# Patient Record
Sex: Female | Born: 1935 | Race: White | Hispanic: No | Marital: Married | Smoking: Never smoker
Health system: Southern US, Community
[De-identification: ages and names within clinical notes are randomized; demographics above are authoritative.]

## PROBLEM LIST (undated history)

## (undated) DIAGNOSIS — N052 Unspecified nephritic syndrome with diffuse membranous glomerulonephritis: Secondary | ICD-10-CM

## (undated) DIAGNOSIS — I10 Essential (primary) hypertension: Secondary | ICD-10-CM

## (undated) DIAGNOSIS — C541 Malignant neoplasm of endometrium: Secondary | ICD-10-CM

## (undated) DIAGNOSIS — M1990 Unspecified osteoarthritis, unspecified site: Secondary | ICD-10-CM

## (undated) DIAGNOSIS — G111 Early-onset cerebellar ataxia: Secondary | ICD-10-CM

## (undated) DIAGNOSIS — K635 Polyp of colon: Secondary | ICD-10-CM

## (undated) DIAGNOSIS — I639 Cerebral infarction, unspecified: Secondary | ICD-10-CM

## (undated) DIAGNOSIS — K449 Diaphragmatic hernia without obstruction or gangrene: Secondary | ICD-10-CM

## (undated) DIAGNOSIS — R269 Unspecified abnormalities of gait and mobility: Secondary | ICD-10-CM

## (undated) DIAGNOSIS — M8580 Other specified disorders of bone density and structure, unspecified site: Secondary | ICD-10-CM

## (undated) DIAGNOSIS — R32 Unspecified urinary incontinence: Secondary | ICD-10-CM

## (undated) DIAGNOSIS — C50919 Malignant neoplasm of unspecified site of unspecified female breast: Secondary | ICD-10-CM

## (undated) DIAGNOSIS — B159 Hepatitis A without hepatic coma: Secondary | ICD-10-CM

## (undated) DIAGNOSIS — E785 Hyperlipidemia, unspecified: Secondary | ICD-10-CM

## (undated) DIAGNOSIS — C4490 Unspecified malignant neoplasm of skin, unspecified: Secondary | ICD-10-CM

## (undated) DIAGNOSIS — G459 Transient cerebral ischemic attack, unspecified: Secondary | ICD-10-CM

## (undated) DIAGNOSIS — M5136 Other intervertebral disc degeneration, lumbar region: Secondary | ICD-10-CM

## (undated) DIAGNOSIS — Z973 Presence of spectacles and contact lenses: Secondary | ICD-10-CM

## (undated) DIAGNOSIS — Z8673 Personal history of transient ischemic attack (TIA), and cerebral infarction without residual deficits: Principal | ICD-10-CM

## (undated) DIAGNOSIS — E669 Obesity, unspecified: Secondary | ICD-10-CM

## (undated) HISTORY — DX: Unspecified urinary incontinence: R32

## (undated) HISTORY — DX: Obesity, unspecified: E66.9

## (undated) HISTORY — DX: Transient cerebral ischemic attack, unspecified: G45.9

## (undated) HISTORY — DX: Other intervertebral disc degeneration, lumbar region: M51.36

## (undated) HISTORY — DX: Polyp of colon: K63.5

## (undated) HISTORY — DX: Unspecified osteoarthritis, unspecified site: M19.90

## (undated) HISTORY — DX: Cerebral infarction, unspecified: I63.9

## (undated) HISTORY — PX: ABDOMINAL HYSTERECTOMY: SHX81

## (undated) HISTORY — DX: Unspecified malignant neoplasm of skin, unspecified: C44.90

## (undated) HISTORY — DX: Presence of spectacles and contact lenses: Z97.3

## (undated) HISTORY — PX: BREAST LUMPECTOMY: SHX2

## (undated) HISTORY — PX: TOTAL KNEE ARTHROPLASTY: SHX125

## (undated) HISTORY — DX: Essential (primary) hypertension: I10

## (undated) HISTORY — DX: Diaphragmatic hernia without obstruction or gangrene: K44.9

## (undated) HISTORY — DX: Personal history of transient ischemic attack (TIA), and cerebral infarction without residual deficits: Z86.73

## (undated) HISTORY — DX: Other specified disorders of bone density and structure, unspecified site: M85.80

## (undated) HISTORY — DX: Unspecified nephritic syndrome with diffuse membranous glomerulonephritis: N05.2

## (undated) HISTORY — PX: COLONOSCOPY: SHX174

## (undated) HISTORY — PX: CATARACT EXTRACTION: SUR2

## (undated) HISTORY — DX: Malignant neoplasm of unspecified site of unspecified female breast: C50.919

## (undated) HISTORY — DX: Unspecified abnormalities of gait and mobility: R26.9

## (undated) HISTORY — DX: Hyperlipidemia, unspecified: E78.5

## (undated) HISTORY — PX: OTHER SURGICAL HISTORY: SHX169

## (undated) HISTORY — DX: Early-onset cerebellar ataxia: G11.1

## (undated) HISTORY — DX: Malignant neoplasm of endometrium: C54.1

## (undated) HISTORY — PX: REPLACEMENT TOTAL KNEE: SUR1224

## (undated) HISTORY — PX: APPENDECTOMY: SHX54

## (undated) HISTORY — DX: Hepatitis a without hepatic coma: B15.9

## (undated) HISTORY — PX: SKIN CANCER EXCISION: SHX779

---

## 1997-12-14 ENCOUNTER — Other Ambulatory Visit: Admission: RE | Admit: 1997-12-14 | Discharge: 1997-12-14 | Payer: Self-pay

## 1997-12-20 ENCOUNTER — Other Ambulatory Visit: Admission: RE | Admit: 1997-12-20 | Discharge: 1997-12-20 | Payer: Self-pay | Admitting: Nephrology

## 1998-01-17 ENCOUNTER — Other Ambulatory Visit: Admission: RE | Admit: 1998-01-17 | Discharge: 1998-01-17 | Payer: Self-pay | Admitting: *Deleted

## 1998-04-19 ENCOUNTER — Ambulatory Visit (HOSPITAL_COMMUNITY): Admission: RE | Admit: 1998-04-19 | Discharge: 1998-04-19 | Payer: Self-pay | Admitting: Cardiology

## 1998-10-18 ENCOUNTER — Other Ambulatory Visit: Admission: RE | Admit: 1998-10-18 | Discharge: 1998-10-18 | Payer: Self-pay | Admitting: Obstetrics & Gynecology

## 1999-06-03 ENCOUNTER — Ambulatory Visit (HOSPITAL_COMMUNITY): Admission: RE | Admit: 1999-06-03 | Discharge: 1999-06-03 | Payer: Self-pay | Admitting: Gastroenterology

## 1999-06-03 ENCOUNTER — Encounter (INDEPENDENT_AMBULATORY_CARE_PROVIDER_SITE_OTHER): Payer: Self-pay

## 2000-01-08 ENCOUNTER — Other Ambulatory Visit: Admission: RE | Admit: 2000-01-08 | Discharge: 2000-01-08 | Payer: Self-pay | Admitting: Obstetrics & Gynecology

## 2000-06-24 ENCOUNTER — Encounter: Payer: Self-pay | Admitting: *Deleted

## 2000-06-24 ENCOUNTER — Encounter: Admission: RE | Admit: 2000-06-24 | Discharge: 2000-06-24 | Payer: Self-pay | Admitting: *Deleted

## 2000-06-30 ENCOUNTER — Encounter: Admission: RE | Admit: 2000-06-30 | Discharge: 2000-06-30 | Payer: Self-pay | Admitting: *Deleted

## 2000-06-30 ENCOUNTER — Encounter: Payer: Self-pay | Admitting: *Deleted

## 2001-02-15 ENCOUNTER — Other Ambulatory Visit: Admission: RE | Admit: 2001-02-15 | Discharge: 2001-02-15 | Payer: Self-pay | Admitting: Obstetrics & Gynecology

## 2001-07-19 ENCOUNTER — Encounter: Payer: Self-pay | Admitting: *Deleted

## 2001-07-19 ENCOUNTER — Encounter: Admission: RE | Admit: 2001-07-19 | Discharge: 2001-07-19 | Payer: Self-pay | Admitting: *Deleted

## 2002-03-28 ENCOUNTER — Other Ambulatory Visit: Admission: RE | Admit: 2002-03-28 | Discharge: 2002-03-28 | Payer: Self-pay | Admitting: Obstetrics & Gynecology

## 2002-07-20 ENCOUNTER — Encounter: Admission: RE | Admit: 2002-07-20 | Discharge: 2002-07-20 | Payer: Self-pay | Admitting: Obstetrics & Gynecology

## 2002-07-20 ENCOUNTER — Encounter: Payer: Self-pay | Admitting: Obstetrics & Gynecology

## 2002-12-08 ENCOUNTER — Encounter: Payer: Self-pay | Admitting: Family Medicine

## 2002-12-08 ENCOUNTER — Encounter: Admission: RE | Admit: 2002-12-08 | Discharge: 2002-12-08 | Payer: Self-pay | Admitting: Family Medicine

## 2003-06-07 ENCOUNTER — Other Ambulatory Visit: Admission: RE | Admit: 2003-06-07 | Discharge: 2003-06-07 | Payer: Self-pay | Admitting: Obstetrics & Gynecology

## 2003-08-23 ENCOUNTER — Encounter: Admission: RE | Admit: 2003-08-23 | Discharge: 2003-08-23 | Payer: Self-pay | Admitting: Obstetrics & Gynecology

## 2003-10-30 ENCOUNTER — Other Ambulatory Visit: Admission: RE | Admit: 2003-10-30 | Discharge: 2003-10-30 | Payer: Self-pay | Admitting: Obstetrics & Gynecology

## 2004-08-23 ENCOUNTER — Encounter: Admission: RE | Admit: 2004-08-23 | Discharge: 2004-08-23 | Payer: Self-pay | Admitting: Family Medicine

## 2004-09-25 ENCOUNTER — Ambulatory Visit (HOSPITAL_COMMUNITY): Admission: RE | Admit: 2004-09-25 | Discharge: 2004-09-25 | Payer: Self-pay | Admitting: Gastroenterology

## 2004-09-25 ENCOUNTER — Encounter (INDEPENDENT_AMBULATORY_CARE_PROVIDER_SITE_OTHER): Payer: Self-pay | Admitting: Specialist

## 2004-09-30 ENCOUNTER — Encounter: Admission: RE | Admit: 2004-09-30 | Discharge: 2004-09-30 | Payer: Self-pay | Admitting: Family Medicine

## 2004-11-26 ENCOUNTER — Other Ambulatory Visit: Admission: RE | Admit: 2004-11-26 | Discharge: 2004-11-26 | Payer: Self-pay | Admitting: Obstetrics & Gynecology

## 2004-12-13 ENCOUNTER — Encounter: Admission: RE | Admit: 2004-12-13 | Discharge: 2004-12-13 | Payer: Self-pay | Admitting: Family Medicine

## 2004-12-14 DIAGNOSIS — M51369 Other intervertebral disc degeneration, lumbar region without mention of lumbar back pain or lower extremity pain: Secondary | ICD-10-CM

## 2004-12-14 DIAGNOSIS — M5136 Other intervertebral disc degeneration, lumbar region: Secondary | ICD-10-CM

## 2004-12-14 HISTORY — DX: Other intervertebral disc degeneration, lumbar region: M51.36

## 2004-12-14 HISTORY — DX: Other intervertebral disc degeneration, lumbar region without mention of lumbar back pain or lower extremity pain: M51.369

## 2004-12-17 ENCOUNTER — Encounter: Admission: RE | Admit: 2004-12-17 | Discharge: 2004-12-17 | Payer: Self-pay | Admitting: Family Medicine

## 2004-12-20 ENCOUNTER — Encounter: Admission: RE | Admit: 2004-12-20 | Discharge: 2004-12-20 | Payer: Self-pay | Admitting: Family Medicine

## 2004-12-24 ENCOUNTER — Encounter: Admission: RE | Admit: 2004-12-24 | Discharge: 2004-12-24 | Payer: Self-pay | Admitting: Gastroenterology

## 2005-07-28 ENCOUNTER — Encounter: Admission: RE | Admit: 2005-07-28 | Discharge: 2005-07-28 | Payer: Self-pay | Admitting: Family Medicine

## 2005-10-20 ENCOUNTER — Encounter: Admission: RE | Admit: 2005-10-20 | Discharge: 2005-10-20 | Payer: Self-pay | Admitting: Family Medicine

## 2005-12-29 ENCOUNTER — Other Ambulatory Visit: Admission: RE | Admit: 2005-12-29 | Discharge: 2005-12-29 | Payer: Self-pay | Admitting: Obstetrics & Gynecology

## 2006-07-08 ENCOUNTER — Ambulatory Visit: Payer: Self-pay | Admitting: Family Medicine

## 2007-03-10 ENCOUNTER — Encounter: Admission: RE | Admit: 2007-03-10 | Discharge: 2007-03-10 | Payer: Self-pay | Admitting: Obstetrics & Gynecology

## 2007-03-18 ENCOUNTER — Encounter: Admission: RE | Admit: 2007-03-18 | Discharge: 2007-03-18 | Payer: Self-pay | Admitting: Obstetrics & Gynecology

## 2007-03-18 ENCOUNTER — Encounter (INDEPENDENT_AMBULATORY_CARE_PROVIDER_SITE_OTHER): Payer: Self-pay | Admitting: Diagnostic Radiology

## 2007-04-01 ENCOUNTER — Encounter: Admission: RE | Admit: 2007-04-01 | Discharge: 2007-04-01 | Payer: Self-pay | Admitting: Obstetrics & Gynecology

## 2007-04-05 ENCOUNTER — Ambulatory Visit: Payer: Self-pay | Admitting: Family Medicine

## 2007-04-28 ENCOUNTER — Encounter: Admission: RE | Admit: 2007-04-28 | Discharge: 2007-04-28 | Payer: Self-pay | Admitting: Surgery

## 2007-04-28 ENCOUNTER — Encounter (INDEPENDENT_AMBULATORY_CARE_PROVIDER_SITE_OTHER): Payer: Self-pay | Admitting: Surgery

## 2007-04-28 ENCOUNTER — Ambulatory Visit (HOSPITAL_COMMUNITY): Admission: RE | Admit: 2007-04-28 | Discharge: 2007-04-28 | Payer: Self-pay | Admitting: Surgery

## 2007-05-04 ENCOUNTER — Ambulatory Visit: Payer: Self-pay | Admitting: Oncology

## 2007-05-19 LAB — CBC WITH DIFFERENTIAL/PLATELET
BASO%: 0.8 % (ref 0.0–2.0)
EOS%: 3.7 % (ref 0.0–7.0)
HCT: 39.7 % (ref 34.8–46.6)
LYMPH%: 37.8 % (ref 14.0–48.0)
MCH: 33.1 pg (ref 26.0–34.0)
MCHC: 35.1 g/dL (ref 32.0–36.0)
MONO%: 8.5 % (ref 0.0–13.0)
NEUT%: 49.2 % (ref 39.6–76.8)
lymph#: 2.2 10*3/uL (ref 0.9–3.3)

## 2007-05-24 ENCOUNTER — Encounter: Admission: RE | Admit: 2007-05-24 | Discharge: 2007-05-24 | Payer: Self-pay | Admitting: Oncology

## 2007-05-24 LAB — COMPREHENSIVE METABOLIC PANEL
ALT: 11 U/L (ref 0–35)
AST: 12 U/L (ref 0–37)
Alkaline Phosphatase: 62 U/L (ref 39–117)
Chloride: 105 mEq/L (ref 96–112)
Creatinine, Ser: 0.76 mg/dL (ref 0.40–1.20)
Total Bilirubin: 0.6 mg/dL (ref 0.3–1.2)

## 2007-05-24 LAB — LACTATE DEHYDROGENASE: LDH: 139 U/L (ref 94–250)

## 2007-05-24 LAB — VITAMIN D PNL(25-HYDRXY+1,25-DIHY)-BLD: Vit D, 1,25-Dihydroxy: 39 pg/mL (ref 6–62)

## 2007-05-24 LAB — CANCER ANTIGEN 27.29: CA 27.29: 16 U/mL (ref 0–39)

## 2007-07-22 ENCOUNTER — Ambulatory Visit: Payer: Self-pay | Admitting: Oncology

## 2007-10-21 ENCOUNTER — Ambulatory Visit: Payer: Self-pay | Admitting: Oncology

## 2007-10-25 LAB — CBC WITH DIFFERENTIAL/PLATELET
BASO%: 1.3 % (ref 0.0–2.0)
Basophils Absolute: 0.1 10*3/uL (ref 0.0–0.1)
HCT: 39.1 % (ref 34.8–46.6)
HGB: 13.6 g/dL (ref 11.6–15.9)
LYMPH%: 37.4 % (ref 14.0–48.0)
MCHC: 34.9 g/dL (ref 32.0–36.0)
MONO#: 0.3 10*3/uL (ref 0.1–0.9)
NEUT%: 50.4 % (ref 39.6–76.8)
Platelets: 187 10*3/uL (ref 145–400)
WBC: 4.7 10*3/uL (ref 3.9–10.0)

## 2007-10-25 LAB — COMPREHENSIVE METABOLIC PANEL
ALT: 12 U/L (ref 0–35)
BUN: 24 mg/dL — ABNORMAL HIGH (ref 6–23)
CO2: 28 mEq/L (ref 19–32)
Calcium: 9 mg/dL (ref 8.4–10.5)
Creatinine, Ser: 0.84 mg/dL (ref 0.40–1.20)
Glucose, Bld: 199 mg/dL — ABNORMAL HIGH (ref 70–99)
Total Bilirubin: 1 mg/dL (ref 0.3–1.2)

## 2007-10-26 ENCOUNTER — Ambulatory Visit: Payer: Self-pay | Admitting: Family Medicine

## 2007-10-28 LAB — VITAMIN D 1,25 DIHYDROXY: Vit D, 1,25-Dihydroxy: 36 pg/mL (ref 6–62)

## 2008-01-07 LAB — HM COLONOSCOPY

## 2008-01-24 ENCOUNTER — Ambulatory Visit: Payer: Self-pay | Admitting: Family Medicine

## 2008-04-24 ENCOUNTER — Encounter: Admission: RE | Admit: 2008-04-24 | Discharge: 2008-04-24 | Payer: Self-pay | Admitting: Oncology

## 2008-04-25 ENCOUNTER — Ambulatory Visit: Payer: Self-pay | Admitting: Family Medicine

## 2008-05-15 ENCOUNTER — Inpatient Hospital Stay (HOSPITAL_COMMUNITY): Admission: RE | Admit: 2008-05-15 | Discharge: 2008-05-18 | Payer: Self-pay | Admitting: Orthopedic Surgery

## 2008-06-28 ENCOUNTER — Ambulatory Visit: Payer: Self-pay | Admitting: Family Medicine

## 2008-07-18 ENCOUNTER — Emergency Department (HOSPITAL_COMMUNITY): Admission: EM | Admit: 2008-07-18 | Discharge: 2008-07-18 | Payer: Self-pay | Admitting: Emergency Medicine

## 2008-08-02 ENCOUNTER — Ambulatory Visit: Payer: Self-pay | Admitting: Family Medicine

## 2008-10-20 ENCOUNTER — Ambulatory Visit: Payer: Self-pay | Admitting: Oncology

## 2008-10-24 LAB — COMPREHENSIVE METABOLIC PANEL
ALT: 23 U/L (ref 0–35)
Alkaline Phosphatase: 32 U/L — ABNORMAL LOW (ref 39–117)
Creatinine, Ser: 1.01 mg/dL (ref 0.40–1.20)
Sodium: 140 mEq/L (ref 135–145)
Total Bilirubin: 0.8 mg/dL (ref 0.3–1.2)
Total Protein: 5.9 g/dL — ABNORMAL LOW (ref 6.0–8.3)

## 2008-10-24 LAB — CBC WITH DIFFERENTIAL/PLATELET
BASO%: 0.3 % (ref 0.0–2.0)
LYMPH%: 29.6 % (ref 14.0–48.0)
MCH: 32.8 pg (ref 26.0–34.0)
MCHC: 33.9 g/dL (ref 32.0–36.0)
MCV: 96.8 fL (ref 81.0–101.0)
MONO%: 5.7 % (ref 0.0–13.0)
Platelets: 156 10*3/uL (ref 145–400)
RBC: 4.01 10*6/uL (ref 3.70–5.32)

## 2009-05-04 ENCOUNTER — Encounter: Admission: RE | Admit: 2009-05-04 | Discharge: 2009-05-04 | Payer: Self-pay | Admitting: Surgery

## 2009-05-04 ENCOUNTER — Ambulatory Visit: Payer: Self-pay | Admitting: Oncology

## 2009-05-09 ENCOUNTER — Ambulatory Visit: Payer: Self-pay | Admitting: Family Medicine

## 2009-05-13 IMAGING — US UNKNOWN US STUDY
1 series · 16 of 16 positions shown · non-contrast
Comparison: none

[REDACTED] LEFT
CC and MLO view(s) were taken of the left breast.

LEFT BREAST ULTRASOUND
Technologist: Elyesa Ver, Medical
DIGITAL LIMITED LEFT DIAGNOSTIC MAMMOGRAM AND LEFT BREAST ULTRASOUND:
CLINICAL DATA: 77-year-old returns after screening study on [DATE] for evaluation of the left 
breast.

[Series 1: unknown us study · 16 of 17 slices shown]
[im 1/17]
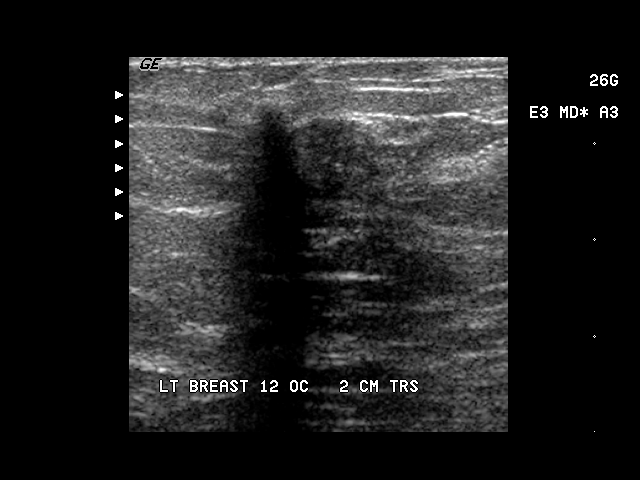
[im 2/17]
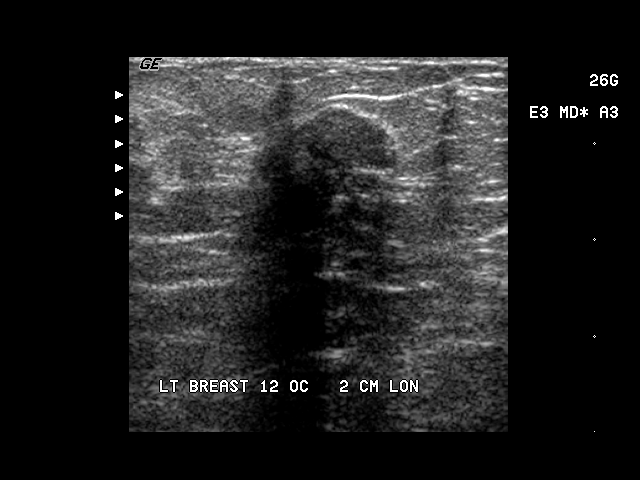
[im 3/17]
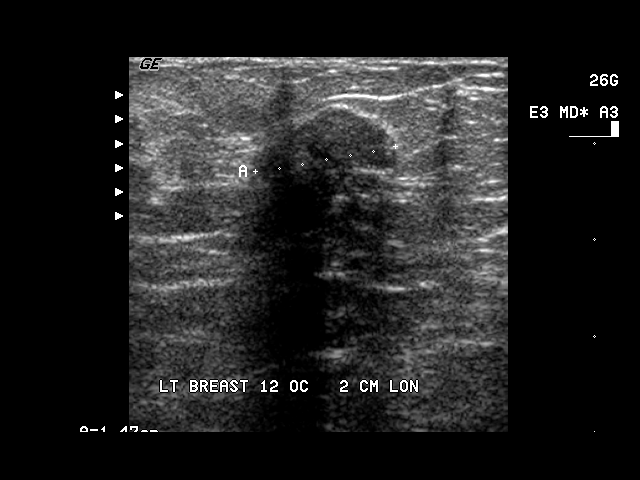
[im 4/17]
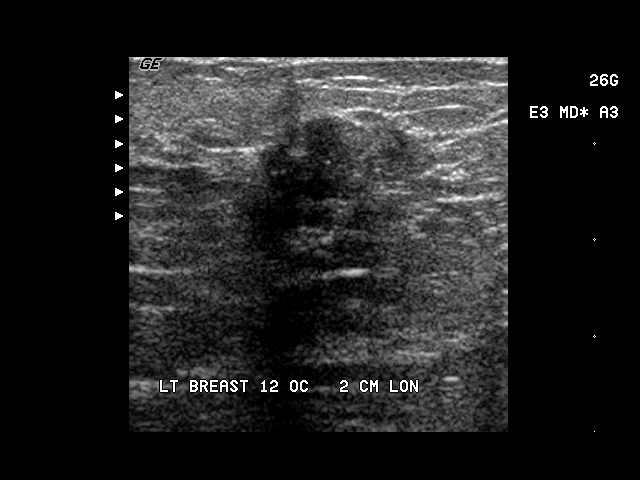
[im 5/17]
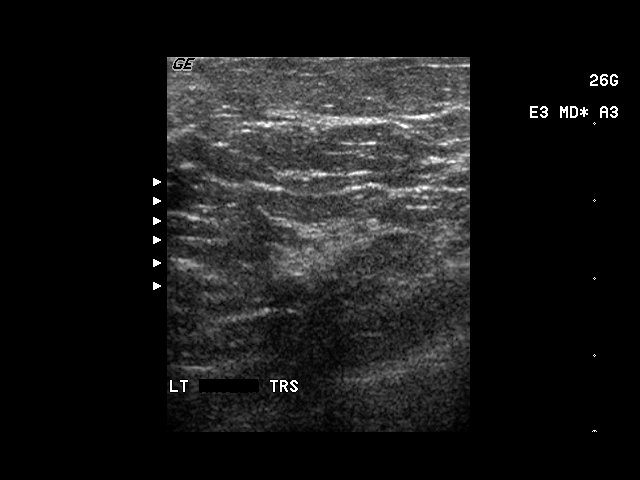
[im 6/17]
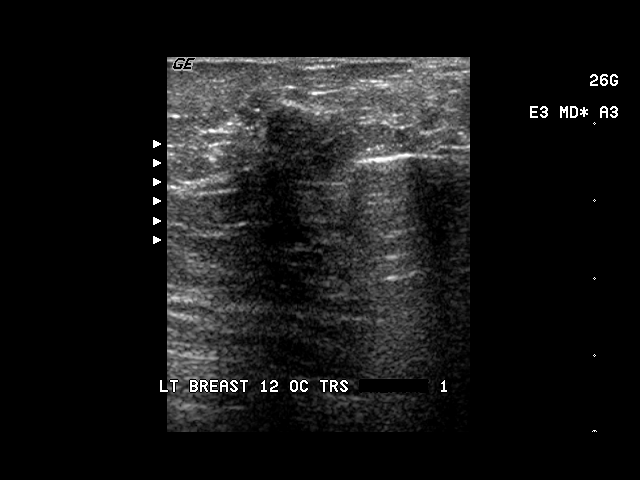
[im 7/17]
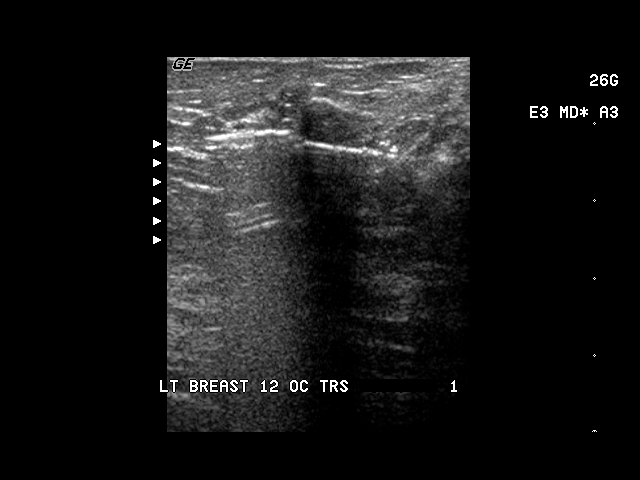
[im 8/17]
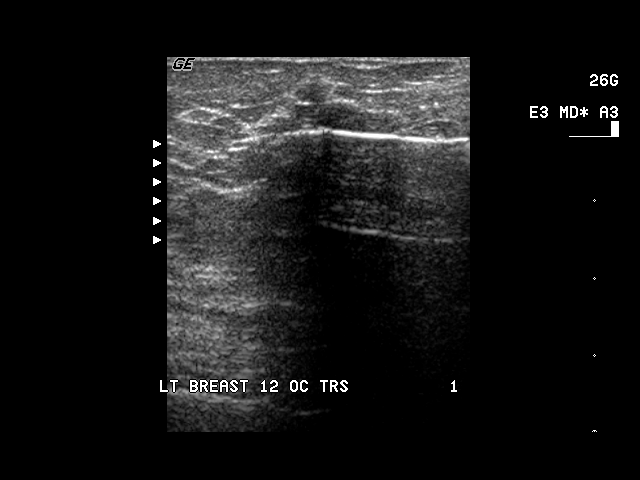
[im 9/17]
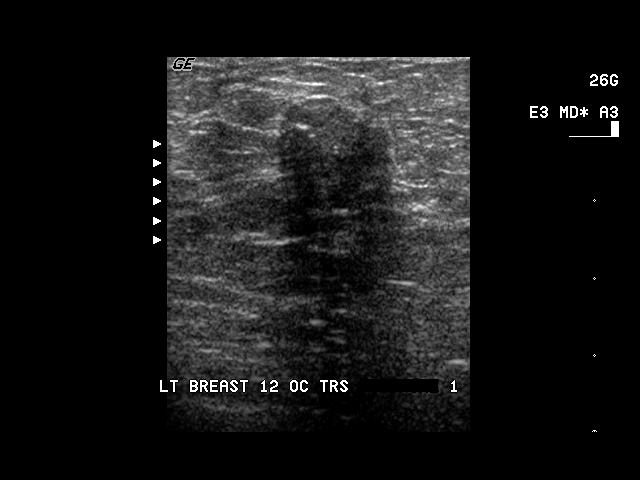
[im 10/17]
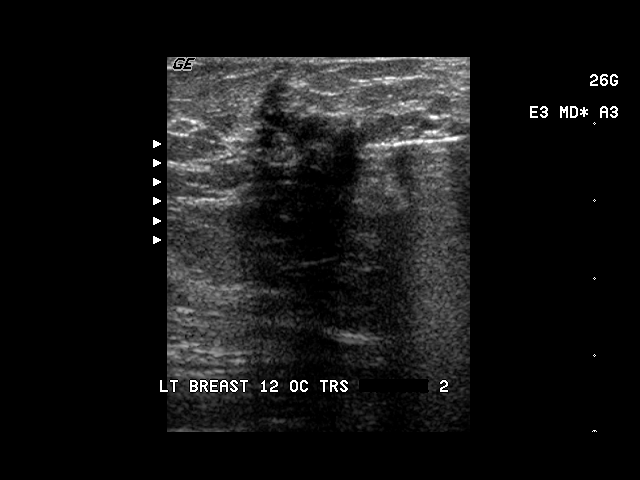
[im 11/17]
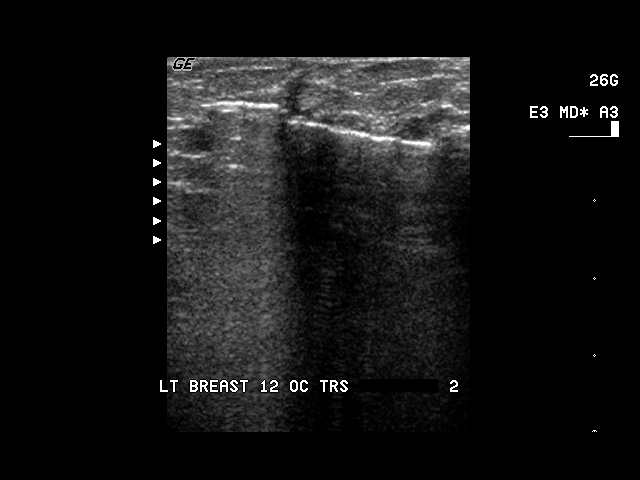
[im 12/17]
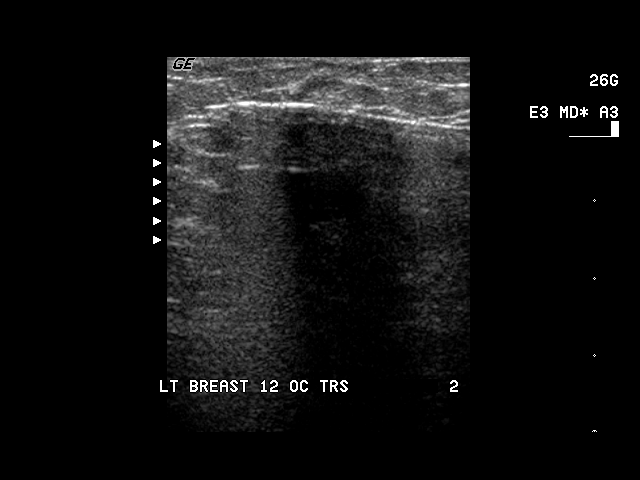
[im 13/17]
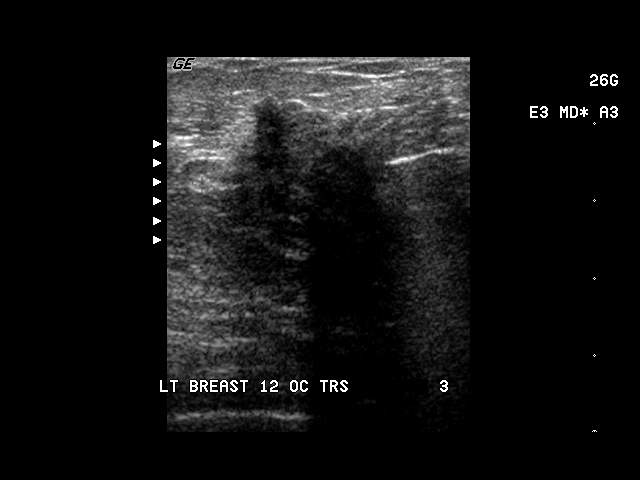
[im 14/17]
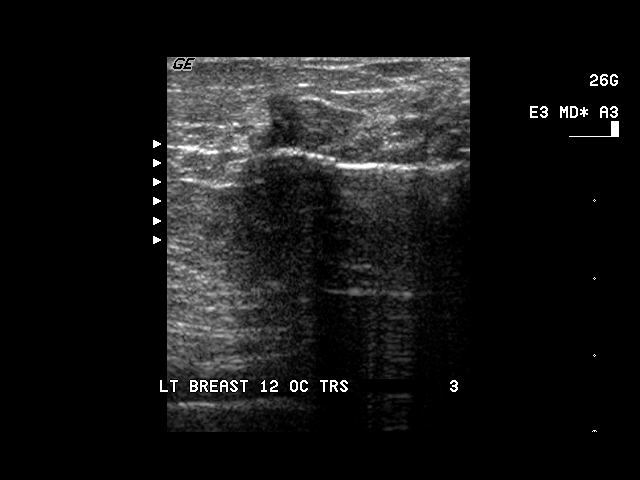
[im 15/17]
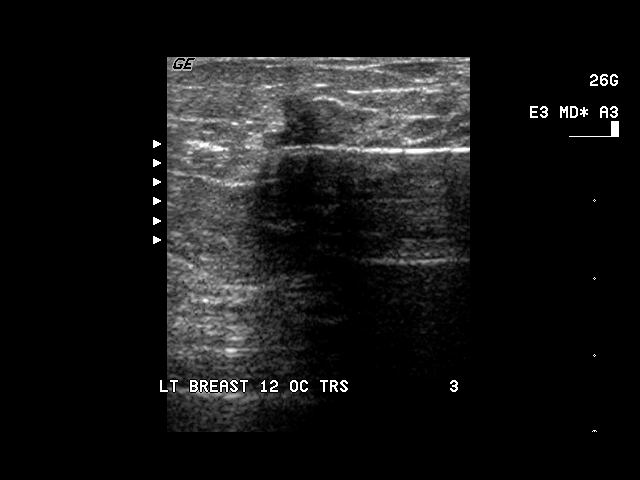
[im 17/17]
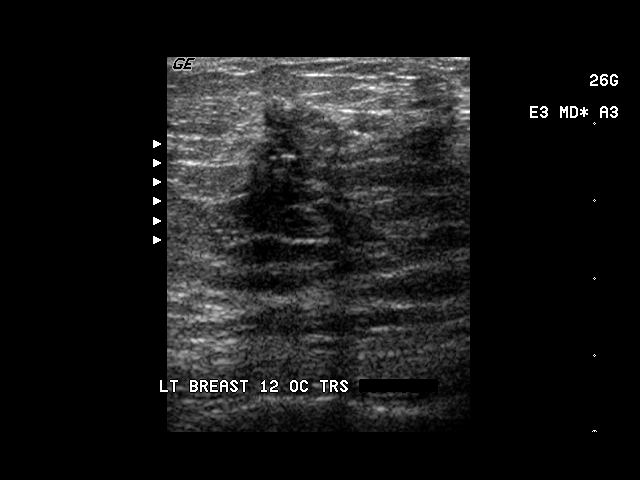

[16 of 16 positions shown; findings below may reference images not displayed]

Spot compression magnified views are performed of the 12 o'clock position of the left breast.  
These views demonstrate spiculated density associated with linear calcifications, suspicious for 
invasive ductal carcinoma and DCIS.

On physical exam, I do not palpate an abnormality in the 12 o'clock position of the left breast.  
Ultrasound is performed showing an ill-defined hypoechoic shadowing mass in the28 o'clock position 
2 cm from the left nipple.  This measures 1.5 cm in diameter.  Findings are suspicious for 
malignancy and biopsy is suggested.  Evaluation of the left axilla shows no enlarged lymph nodes.
IMPRESSION: Suspicious mass in the 12 o'clock position of the left breast for which biopsy is suggested.

ASSESSMENT: Suspicious - BI-RADS 4

Needle biopsy of the left breast.
,

## 2009-11-06 ENCOUNTER — Ambulatory Visit: Payer: Self-pay | Admitting: Oncology

## 2009-11-08 LAB — COMPREHENSIVE METABOLIC PANEL
ALT: 22 U/L (ref 0–35)
CO2: 29 mEq/L (ref 19–32)
Calcium: 9.1 mg/dL (ref 8.4–10.5)
Chloride: 104 mEq/L (ref 96–112)
Creatinine, Ser: 0.97 mg/dL (ref 0.40–1.20)
Total Protein: 6.2 g/dL (ref 6.0–8.3)

## 2009-11-08 LAB — CBC WITH DIFFERENTIAL/PLATELET
BASO%: 0.9 % (ref 0.0–2.0)
HCT: 39 % (ref 34.8–46.6)
HGB: 13.2 g/dL (ref 11.6–15.9)
MCHC: 34 g/dL (ref 31.5–36.0)
MONO#: 0.5 10*3/uL (ref 0.1–0.9)
NEUT#: 1.8 10*3/uL (ref 1.5–6.5)
NEUT%: 38.4 % (ref 38.4–76.8)
WBC: 4.7 10*3/uL (ref 3.9–10.3)
lymph#: 2.1 10*3/uL (ref 0.9–3.3)

## 2009-11-12 ENCOUNTER — Ambulatory Visit: Payer: Self-pay | Admitting: Family Medicine

## 2010-05-06 ENCOUNTER — Encounter: Admission: RE | Admit: 2010-05-06 | Discharge: 2010-05-06 | Payer: Self-pay | Admitting: Surgery

## 2010-05-06 LAB — HM MAMMOGRAPHY

## 2010-06-24 ENCOUNTER — Ambulatory Visit: Payer: Self-pay | Admitting: Family Medicine

## 2010-06-24 ENCOUNTER — Encounter: Admission: RE | Admit: 2010-06-24 | Discharge: 2010-06-24 | Payer: Self-pay | Admitting: Family Medicine

## 2010-06-26 ENCOUNTER — Ambulatory Visit: Payer: Self-pay | Admitting: Family Medicine

## 2010-09-26 ENCOUNTER — Encounter
Admission: RE | Admit: 2010-09-26 | Discharge: 2010-09-26 | Payer: Self-pay | Source: Home / Self Care | Attending: Neurology | Admitting: Neurology

## 2010-11-08 ENCOUNTER — Encounter: Payer: Medicare Other | Admitting: Oncology

## 2010-11-18 ENCOUNTER — Other Ambulatory Visit: Payer: Self-pay | Admitting: Oncology

## 2010-11-18 ENCOUNTER — Encounter (HOSPITAL_BASED_OUTPATIENT_CLINIC_OR_DEPARTMENT_OTHER): Payer: Medicare Other | Admitting: Oncology

## 2010-11-18 DIAGNOSIS — C50919 Malignant neoplasm of unspecified site of unspecified female breast: Secondary | ICD-10-CM

## 2010-11-18 DIAGNOSIS — Z853 Personal history of malignant neoplasm of breast: Secondary | ICD-10-CM

## 2010-11-18 DIAGNOSIS — Z780 Asymptomatic menopausal state: Secondary | ICD-10-CM

## 2010-11-18 LAB — COMPREHENSIVE METABOLIC PANEL
ALT: 28 U/L (ref 0–35)
CO2: 29 mEq/L (ref 19–32)
Calcium: 9.2 mg/dL (ref 8.4–10.5)
Chloride: 105 mEq/L (ref 96–112)
Potassium: 3.9 mEq/L (ref 3.5–5.3)
Sodium: 142 mEq/L (ref 135–145)
Total Protein: 6.1 g/dL (ref 6.0–8.3)

## 2010-11-18 LAB — CBC WITH DIFFERENTIAL/PLATELET
BASO%: 0.5 % (ref 0.0–2.0)
HCT: 42 % (ref 34.8–46.6)
MCHC: 33.4 g/dL (ref 31.5–36.0)
MONO#: 0.3 10*3/uL (ref 0.1–0.9)
RBC: 4.18 10*6/uL (ref 3.70–5.45)
RDW: 13.2 % (ref 11.2–14.5)
WBC: 4.9 10*3/uL (ref 3.9–10.3)
lymph#: 1.9 10*3/uL (ref 0.9–3.3)

## 2010-11-18 LAB — CANCER ANTIGEN 27.29: CA 27.29: 18 U/mL (ref 0–39)

## 2010-12-02 ENCOUNTER — Ambulatory Visit (INDEPENDENT_AMBULATORY_CARE_PROVIDER_SITE_OTHER): Payer: Medicare Other | Admitting: Family Medicine

## 2010-12-02 DIAGNOSIS — E7800 Pure hypercholesterolemia, unspecified: Secondary | ICD-10-CM

## 2010-12-02 DIAGNOSIS — I10 Essential (primary) hypertension: Secondary | ICD-10-CM

## 2011-01-01 ENCOUNTER — Ambulatory Visit (INDEPENDENT_AMBULATORY_CARE_PROVIDER_SITE_OTHER): Payer: Medicare Other | Admitting: Family Medicine

## 2011-01-01 DIAGNOSIS — J209 Acute bronchitis, unspecified: Secondary | ICD-10-CM

## 2011-01-01 DIAGNOSIS — I10 Essential (primary) hypertension: Secondary | ICD-10-CM

## 2011-01-01 DIAGNOSIS — E119 Type 2 diabetes mellitus without complications: Secondary | ICD-10-CM

## 2011-01-28 NOTE — Op Note (Signed)
Kristin Willis, Kristin Willis               ACCOUNT NO.:  [id number]   MEDICAL RECORD NO.:  [id number]          PATIENT TYPE:  AMB   LOCATION:  SDS                          FACILITY:  [facility]   PHYSICIAN:  Thomas A. Cornett, M.D.DATE OF BIRTH:  1928/11/10   DATE OF PROCEDURE:  04/28/2007  DATE OF DISCHARGE:                               OPERATIVE REPORT   PREOPERATIVE DIAGNOSIS:  Left breast cancer.   POSTOPERATIVE DIAGNOSIS:  Left breast cancer.   PROCEDURE:  1. Left breast needle localized lumpectomy.  2. Left axillary sentinel lymph node mapping with injection of      methylene blue dye.   SURGEON:  Maisie Fus A. Cornett, M.D.   ANESTHESIA:  General endotracheal anesthesia with 0.25% Sensorcaine  local.   ESTIMATED BLOOD LOSS:  40 mL.   SPECIMEN:  1. Left breast mass to pathology.  2. Left axillary sentinel node blue and hot, negative by touch prep      for metastatic disease.   DRAINS:  None.   INDICATIONS FOR PROCEDURE:  The patient is a 77 year old female who was  referred due to a left breast cancer.  We talked about treatment options  and she wished to pursue breast conservation and elected to undergo  lumpectomy with subsequent sentinel lymph node mapping.  She presents  today for that.  Informed consent was obtained.   DESCRIPTION OF PROCEDURE:  The patient was brought to the operating room  and placed supine.  She was localized by the radiologist preoperatively  and technetium sulfur colloid was injected in the left periareolar  position prior to surgery.  After sterile prep and drape and induction  of general anesthesia, the left breast was examined.  The NeoProbe was  used and a hot area in the left axilla was identified.  An incision was  made in the left axilla.  Dissection was carried down into the axilla.  A blue hot sentinel node was identified and sent to pathology.  The  remainder of the axilla was quiet with no increase in activity or blue  dye noted.  Touch prep  revealed this to be negative for metastatic  disease.   Next, the localizing wire was visualized.  In the left upper outer  quadrant is where the wire exited.  A curvilinear incision was made in  the direction of the nipple areolar complex.  This process was directly  below the nipple.  I stayed as superficial as I could to stay around  this while preserving the nipple complex.  This grossly looked close,  but I felt I had at least 2-3 mm below the nipple.  Cautery was used and  this was dissected away carefully.  I was able to excise the entire mass  with the localizing wire.  This was sent for radiograph and the wire,  clip and mass were all present.  Imprint cytology of the mass was done  and it looked like I had at least 2-3 mm from the superficial margin,  that was my closest [facility] according to the pathologist.  Both cavities  were then irrigated.  I  closed the lumpectomy cavity with 3-0 Vicryl.  4-  0 Monocryl was used to close the skin.  There was one area just between  the incision and the nipple laterally where there was a small burn from  the cautery.  I went ahead and excised this with a scalpel and closed it  with a single stitch of 4-0 Monocryl.  I then irrigated the axilla,  found it to be hemostatic, and closed it in layers with 3-0 Vicryl and 4-  0 Monocryl.  Dermabond was applied to both wounds.  There was minimal  cosmetic change of the breast.  At this point, once the Dermabond had  dried, the patient was awakened and taken to recovery in satisfactory  condition.  All final counts of sponge, needle and instruments were  found to be correct for this portion of the case.      Thomas A. Cornett, M.D.  Electronically Signed     TAC/MEDQ  D:  04/28/2007  T:  04/29/2007  Job:  [id number]   cc:   Sharlot Gowda, M.D.  Duke Salvia Eliott Nine, M.D.

## 2011-01-28 NOTE — Discharge Summary (Signed)
Kristin Willis, Kristin Willis               ACCOUNT NO.:  [id number]   MEDICAL RECORD NO.:  [id number]          PATIENT TYPE:  INP   LOCATION:  5006                         FACILITY:  [facility]   PHYSICIAN:  Robert A. Thurston Hole, M.D. DATE OF BIRTH:  Nov 28, 1928   DATE OF ADMISSION:  05/15/2008  DATE OF DISCHARGE:  05/18/2008                               DISCHARGE SUMMARY   ADMITTING DIAGNOSES:  1. End-stage degenerative joint disease left knee.  2. Diet-controlled diabetes.  3. Hypertension.  4. High cholesterol.  5. History of breast cancer.  6. History of membranous glomerulopathy of the kidneys.  7. History of endometrial cancer.  8. Gout.   DISCHARGE DIAGNOSES:  1. End-stage degenerative joint disease left knee.  2. Postoperative blood loss anemia.  3. Diet-controlled diabetes.  4. Hypertension.  5. High cholesterol.  6. Breast cancer.  7. Membranous glomerulopathy.  8. Endometrial cancer.  9. Gout.  10.Chronic obstructive pulmonary disease.   HISTORY OF PRESENT ILLNESS:  The patient is a 77 year old white female  with a history of end-stage DJD of her left knee.  She has failed  conservative care including anti-inflammatories and articular cortisone  injections and exercise.  Risk, benefits and possible complications of a  left total knee replacement have been discussed with the patient.  She  is without question.   PROCEDURES IN-HOUSE:  On May 15, 2008, the patient underwent a left  total knee replacement by Dr. Thurston Hole, femoral nerve block by Anesthesia  and a Autovac transfusion.  She tolerated all of these procedures well.  Nephrology was consulted in the recovery room due to her kidney disease  to help manage fluid balance and renal function but he held her  lisinopril and Cozaar for the next 1-2 days until she was  hemodynamically stable.  If we needed blood pressure control, we could  write clonidine.  Postop day #1, the patient's hemoglobin was 11.7.  Her  glucose was  high at 207.  Her BUN was high at 24.  Otherwise, she was  metabolically stable.  She tolerated her CPM 0 to 90 degrees.  Her PCA  was discontinued.  She was started on NUCYNTA 50 mg one to two q.4 h.  p.r.n. pain.  Her Foley was discontinued.  She was given a 500 mL bolus  of normal saline and her IV was saline locked.  Postop day #2, the  patient ambulated 20 feet with physical therapy.  Her hemoglobin was  10.2.  She was metabolically stable with a CBG of 184, a BUN of 12 and a  creatinine of 0.75.  She tolerated her CPM 0-90 degrees.  Surgical wound  was well-approximated.  She was given sorbitol for constipation  secondary to a recent surgery and narcotic use.  Postop day #2, the  patient was only able to walk 8 feet.  She was unable to support her  weight on her lower extremity with her leg giving away multiple times.  Postop day #3, the patient was significantly improved.  Her hemoglobin  was 9.4.  Her CBGs were 164 and 168.  She was metabolically  stable.  She  had no shortness of breath.  She ambulated 50 feet without dizziness.  She was discharged to home in stable condition after walking a 100 feet  with physical therapy today, significantly improved from yesterday.  She  is weightbearing as tolerated being discharged home on a diabetic diet.   DISCHARGE MEDICATIONS:  1. NUCYNTA 50 mg one to two q. 4-6 h. p.r.n. pain.  2. Robaxin 500 mg one q.6 h. p.r.n. muscle spasm.  3. Coumadin 7.5 mg daily by mouth until changed by [place].  4. Tamoxifen 20 mg daily.  5. Carvedilol 12.5 mg daily.  6. Lovaza 1g twice a day.  7. Klor-Con twice a day.   The rest of her medicines have been put on hold until her blood pressure  is greater than 130/80.  She will follow up with Dr. Thurston Hole on May 29, 2008.  She will get home health physical therapy, home health  occupational therapy and nursing.      Kirstin Shepperson, P.A.      Robert A. Thurston Hole, M.D.  Electronically  Signed    KS/MEDQ  D:  06/21/2008  T:  06/22/2008  Job:  [id number]

## 2011-01-28 NOTE — Op Note (Signed)
NAMEAVYANNA, SPADA NO.:  [id number]   MEDICAL RECORD NO.:  [id number]          PATIENT TYPE:  INP   LOCATION:  2899                         FACILITY:  [facility]   PHYSICIAN:  Elana Alm. Thurston Hole, M.D. DATE OF BIRTH:  1929/04/18   DATE OF PROCEDURE:  05/15/2008  DATE OF DISCHARGE:                               OPERATIVE REPORT   PREOPERATIVE DIAGNOSIS:  Left knee degenerative joint disease.   POSTOPERATIVE DIAGNOSIS:  Left knee degenerative joint disease.   PROCEDURE:  Left total knee replacement using DePuy Cemented Total Knee  System with #3 cemented femur, #4 cemented tibia with 15-mm polyethylene  RP tibial spacer and 32-mm polyethylene cemented patella.   SURGEON:  Elana Alm. Thurston Hole, MD   ASSISTANT:  Julien Girt, PA   ANESTHESIA:  General.   OPERATIVE TIME:  1 hour and 20 minutes.   COMPLICATIONS:  None.   DESCRIPTION OF PROCEDURE:  Ms. Headen was brought to the operating room  on May 15, 2008, after a femoral block was placed on her by  Anesthesia.  She was placed on the operative table in supine position.  She received vancomycin 1 g IV preoperatively for prophylaxis.  After  being placed under general anesthesia, she had a Foley catheter placed  under sterile conditions.  Her left knee was examined.  Range of motion  from -5 to 125 degrees, mild varus deformity.  Knee stable on  ligamentous exam with normal patellar tracking.  The left leg was  prepped using sterile DuraPrep and draped using sterile technique.  Originally through a 15 cm longitudinal incision based over the patella,  an initial exposure was made.  The underlying subcutaneous tissues were  incised along with skin incision.  A median arthrotomy was performed  revealing an excessive amount of normal-appearing joint fluid.  The  articular surfaces were inspected.  She had grade 4 changes medially,  grade 3 changes laterally, and grade 3 and 4 changes in the  patellofemoral  joint.  Intramedullary drill was then drilled up the  femoral canal for placement of distal femoral cutting jig, which was  placed in the appropriate manner of rotation and a distal 11 mm cut was  made.  The distal femur was incised.  The #3 was found to be the  appropriate size.  The #3 cutting jig was placed in the appropriate  manner by external rotation and then these cuts were made.  After this  was done, then the proximal tibia was exposed.  The tibial spines were  removed with an oscillating saw.  The intramedullary drill was then  drilled down the tibial canal for placement of proximal tibial cutting  jig, which was placed in the appropriate manner of rotation and a  proximal 6-mm cut was made based off the medial or lower side.  Spacer  blocks were then placed in flexion and extension.  The 15-mm blocks gave  excellent balancing, excellent stability and excellent correction of her  flexion and varus deformities.  At this point #4 tibial base plate trial  was placed on the cut tibial surface with  an excellent fit and the keel  cut was made.  The PCL box cutter was then placed on the distal femur  and these cuts were made.  At this point, the #3 femoral trial was  placed with the #4 tibial base plate trial and a 15-mm polyethylene RP  tibial spacer.  Knee was reduced, taken through full range of motion,  from 0-120 degrees of excellent stability and excellent correction of  her flexion and varus deformities and normal patellar tracking.  Resurfacing, a 9-mm cut was made on the patella and 3 locking holes  placed for a 32-mm patella.  The patellar trial was placed.  Again  patellofemoral tracking was found to be normal.  At this point, it was  felt that all the trial components were of excellent size, fit, and  stability.  They were then removed.  The knee was then jet lavage  irrigated with 3 liters of saline.  The proximal tibia was then exposed.  The #4 tibial baseplate with  cement backing was then hammered into  position with an excellent fit with excess cement being removed from  around the edges.  The #3 femoral component with cement backing was  hammered into position also with an excellent fit with excess cement  being removed from around the edges.  The 15-mm polyethylene RP tibial  spacer was placed on the tibial baseplate.  The knee reduced, taken  through full range of motion from 0-120 degrees with excellent stability  and excellent correction of her flexion and varus deformities.  The 32-  mm polyethylene cement backed patella was then placed in this position  and held there with a clamp.  After the cement hardened, again  patellofemoral tracking was evaluated and found to be normal.  At this  point, it was felt that all the components were of excellent size, fit,  and stability.  The wound was further irrigated with saline.  The  tourniquet was released.  Hemostasis obtained with the cautery.  The  arthrotomy was then closed with #1 Ethibond suture over two medium  Hemovac drains.  Subcutaneous tissues closed with 0 and 2-0 Vicryl,  subcuticular layer closed with 4-0 Monocryl.  Sterile dressings and a  long-leg splint applied.  The patient then awakened, extubated, and  taken to recovery room in stable condition.  Needle and sponge counts  were correct x2 at the end of the case.  Neurovascular status normal  postoperatively as well.      Robert A. Thurston Hole, M.D.  Electronically Signed     RAW/MEDQ  D:  05/15/2008  T:  05/16/2008  Job:  [id number]

## 2011-01-31 NOTE — Op Note (Signed)
Kristin Willis, Kristin Willis               ACCOUNT NO.:  [id number]   MEDICAL RECORD NO.:  [id number]          PATIENT TYPE:  AMB   LOCATION:  ENDO                         [facility]   PHYSICIAN:  Bernette Redbird, M.D.   DATE OF BIRTH:  06/13/1929   DATE OF PROCEDURE:  09/25/2004  DATE OF DISCHARGE:                                 OPERATIVE REPORT   PROCEDURE:  Colonoscopy with polypectomy and biopsy.   INDICATION:  Follow-up of prior colonic adenoma.  Note that the patient's  most recent colonoscopy, five years ago, showed just a hyperplastic polyp.   FINDINGS:  One medium-size polyp into small polyps removed.  Sigmoid  diverticulosis.   PROCEDURE:  The nature, purpose, risks of the procedure were familiar to the  patient from prior examination, and she provided written consent.  Sedation  was fentanyl 62.5 mcg and Versed 5 mg IV without arrhythmias or  desaturation.   The Olympus adult video colonoscope was advanced with moderate difficulty  through a slightly fixated and angulated sigmoid region and with a fair  amount of looping, overcome by having the patient in the supine position and  applying external abdominal compression.  With these maneuvers, I was able  to reach the cecum as identified by the absence of further lumen and a  typical cecal appearance including a couple of pills sitting in it.  Pullback was then performed.  The quality of prep was quite good, and it is  felt that all areas were adequately seen.   The exam was pertinent for the presence of two diminutive sessile polyps at  about 60 cm removed by cold biopsy technique, and a pedunculated 1-cm to 12-  mm polyp on a  medium stalk at about 15 cm from the external anal opening,  removed by cautery snare technique with complete hemostasis and no evidence  of excessive cautery or residual polyp tissue.   There was moderate sigmoid diverticulosis and muscular thickening in that  area.   It was noted that a  fold near the rectosigmoid junction was hemorrhagic,  apparently due to scope trauma, but without any obvious laceration or  perforation.   Retroflexion was not performed in the rectum due to a relatively small  rectal ampulla and the proximity of the scope trauma on the above-mentioned  fold, but antegrade viewing as well as reinspection of the rectosigmoid  disclosed no additional abnormalities.   The patient tolerated the procedure well, and there were no apparent  complications.   IMPRESSION:  1.  Rectal polyp removed by snare technique as described above (211.4).  2.  Small colon polyps removed by cold biopsy technique as described above      (211.3).  3.  Sigmoid diverticulosis.  4.  Prior history of colonic adenoma.   PLAN:  Await pathology on the polyps with probable colonoscopic follow-up in  three years.      RB/MEDQ  D:  09/25/2004  T:  09/25/2004  Job:  [id number]   cc:   Duke Salvia. Eliott Nine, M.D.  [address]  [place]  [place] [address]  Fax:  [phone] 

## 2011-02-03 ENCOUNTER — Encounter (INDEPENDENT_AMBULATORY_CARE_PROVIDER_SITE_OTHER): Payer: Self-pay | Admitting: Surgery

## 2011-02-14 LAB — HM DEXA SCAN

## 2011-02-17 ENCOUNTER — Ambulatory Visit
Admission: RE | Admit: 2011-02-17 | Discharge: 2011-02-17 | Disposition: A | Discharge status: Home / Self Care | Payer: Medicare Other | Source: Ambulatory Visit | Attending: Oncology | Admitting: Oncology

## 2011-02-17 DIAGNOSIS — Z853 Personal history of malignant neoplasm of breast: Secondary | ICD-10-CM

## 2011-02-17 DIAGNOSIS — Z780 Asymptomatic menopausal state: Secondary | ICD-10-CM

## 2011-02-21 ENCOUNTER — Other Ambulatory Visit: Payer: Self-pay | Admitting: Family Medicine

## 2011-02-25 ENCOUNTER — Other Ambulatory Visit: Payer: Self-pay | Admitting: Oncology

## 2011-02-25 ENCOUNTER — Encounter (HOSPITAL_BASED_OUTPATIENT_CLINIC_OR_DEPARTMENT_OTHER): Payer: Medicare Other | Admitting: Oncology

## 2011-02-25 DIAGNOSIS — Z853 Personal history of malignant neoplasm of breast: Secondary | ICD-10-CM

## 2011-02-25 DIAGNOSIS — C50919 Malignant neoplasm of unspecified site of unspecified female breast: Secondary | ICD-10-CM

## 2011-02-25 DIAGNOSIS — Z170 Estrogen receptor positive status [ER+]: Secondary | ICD-10-CM

## 2011-02-25 DIAGNOSIS — C50119 Malignant neoplasm of central portion of unspecified female breast: Secondary | ICD-10-CM

## 2011-02-25 LAB — COMPREHENSIVE METABOLIC PANEL
ALT: 25 U/L (ref 0–35)
AST: 25 U/L (ref 0–37)
Alkaline Phosphatase: 37 U/L — ABNORMAL LOW (ref 39–117)
Calcium: 9.5 mg/dL (ref 8.4–10.5)
Chloride: 102 mEq/L (ref 96–112)
Creatinine, Ser: 1.16 mg/dL — ABNORMAL HIGH (ref 0.50–1.10)

## 2011-02-25 LAB — CBC WITH DIFFERENTIAL/PLATELET
BASO%: 0.9 % (ref 0.0–2.0)
EOS%: 3.8 % (ref 0.0–7.0)
HCT: 39.8 % (ref 34.8–46.6)
MCH: 34.1 pg — ABNORMAL HIGH (ref 25.1–34.0)
MCHC: 33.7 g/dL (ref 31.5–36.0)
MCV: 101.1 fL — ABNORMAL HIGH (ref 79.5–101.0)
MONO%: 8 % (ref 0.0–14.0)
NEUT%: 39.7 % (ref 38.4–76.8)
RDW: 13.1 % (ref 11.2–14.5)
lymph#: 2.1 10*3/uL (ref 0.9–3.3)

## 2011-03-05 ENCOUNTER — Ambulatory Visit (INDEPENDENT_AMBULATORY_CARE_PROVIDER_SITE_OTHER): Payer: Medicare Other | Admitting: Family Medicine

## 2011-03-05 ENCOUNTER — Encounter: Payer: Self-pay | Admitting: Family Medicine

## 2011-03-05 ENCOUNTER — Encounter: Payer: Self-pay | Admitting: *Deleted

## 2011-03-05 VITALS — BP 150/72 | HR 68 | Ht 67.5 in | Wt 231.0 lb

## 2011-03-05 DIAGNOSIS — E7800 Pure hypercholesterolemia, unspecified: Secondary | ICD-10-CM

## 2011-03-05 DIAGNOSIS — E1151 Type 2 diabetes mellitus with diabetic peripheral angiopathy without gangrene: Secondary | ICD-10-CM | POA: Insufficient documentation

## 2011-03-05 DIAGNOSIS — E119 Type 2 diabetes mellitus without complications: Secondary | ICD-10-CM

## 2011-03-05 DIAGNOSIS — I10 Essential (primary) hypertension: Secondary | ICD-10-CM | POA: Insufficient documentation

## 2011-03-05 NOTE — Patient Instructions (Signed)
Please continue to check your blood pressure at home.  Bring your new blood pressure monitor to a visit to have it checked (with Dr. Lynelle Doctor or with any of your other doctors)

## 2011-03-05 NOTE — Progress Notes (Signed)
  Subjective:    Patient ID: Kristin Willis, female    DOB: Nov 24, 1928, 77 y.o.   MRN: [id number]  HPI Diabetes follow-up:  Blood sugars at home are running 130-150 (checked every other day)  Cut bread out of her diet, cut back on all carbs.  Walking some, water aerobics once a week.  Denies polydipsia and polyuria. Has eye appt next week.  Patient follows a low sugar diet and checks feet regularly without concerns.  Hypertension follow-up:  Blood pressures elsewhere are around 140/70, "normal" per the chart she has.  She bought a new BP cuff which seems to work better.  She was supposed to increase her amlodipine from 2.5 to 5mg  per last note, but patient states that she is still taking only 2.5mg .  But reports that BP's have been much better.  Denies dizziness, headaches, chest pain.  Denies side effects of medications.  Had bone density in June (ordered by Dr. Ninfa Linden was reviewed.  Shows osteopenia.  Hyperlipidemia:  Adequately controlled per labs in 11/2010.   Received labs from Dr. Domingo Dimes CBC, chem panel except glu 242 (done 6/12, not fasting) Review of Systems Urinary incontinence is improved on her current therapy (neuromodulation).  Denies headaches, chest pain, palpitations, cough, SOB, edema, skin rashes, nausea/vomiting/diarrhea or other complaints.  Slight pain in R hip only when she wakes up, better as the day goes on.  Some chronic numbness L leg    Objective:   Physical Exam  Well developed, well nourished patient, in no distress BP 150/72  Pulse 68  Ht 5' 7.5" (1.715 m)  Wt 231 lb (104.781 kg)  BMI 35.65 kg/m2 Neck: No lymphadenopathy or thyromegaly, no carotid bruit Heart:  Regular rate and rhythm, no murmurs, rubs, gallops or ectopy Lungs:  Clear bilaterally, without wheezes, rales or ronchi Extremities:  No clubbing, or cyanosis, 2+ pulses.  Trace edema Neuro:  Alert and oriented x 3, cranial nerves grossly intact.  Back:  No spine or CVA  tenderness Skin: no rashes or suspicious lesions Psych:  Normal mood, affect, hygiene and grooming, normal speech, eye contact      Assessment & Plan:   1. Type II or unspecified type diabetes mellitus without mention of complication, not stated as uncontrolled  POCT HgB A1C, Hemoglobin A1c  2. Pure hypercholesterolemia  Lipid panel  3. Essential hypertension, benign     HbA1c 6.7 today.  She declines medication.  She will continue to watch her carbs and monitor blood sugars.  Continued weight loss is recommended.  HTN--Some confusion in that Amlodipine dose was never increased.  She has borderline control per home numbers.  Recommend that she have monitor verified at next visit.  If BP's remain elevated, will try again to increase amlodipine, but for now will continue at the 2.5mg  dose.

## 2011-03-06 ENCOUNTER — Telehealth: Payer: Self-pay | Admitting: Family Medicine

## 2011-03-06 NOTE — Telephone Encounter (Signed)
Noted.  Amlodipine dose corrected on med list

## 2011-03-06 NOTE — Telephone Encounter (Signed)
Pt called advised she is taking Amlodipine 5 mg qd and not the 2.5mg  on her list. FYI.

## 2011-03-11 ENCOUNTER — Other Ambulatory Visit: Payer: Self-pay | Admitting: *Deleted

## 2011-03-11 DIAGNOSIS — E785 Hyperlipidemia, unspecified: Secondary | ICD-10-CM

## 2011-03-11 MED ORDER — EZETIMIBE 10 MG PO TABS: 10.0000 mg | ORAL_TABLET | Freq: Every day | ORAL | Status: DC

## 2011-03-24 ENCOUNTER — Other Ambulatory Visit: Payer: Self-pay | Admitting: Family Medicine

## 2011-04-30 ENCOUNTER — Other Ambulatory Visit: Payer: Self-pay | Admitting: *Deleted

## 2011-04-30 DIAGNOSIS — M8580 Other specified disorders of bone density and structure, unspecified site: Secondary | ICD-10-CM

## 2011-04-30 MED ORDER — IBANDRONATE SODIUM 150 MG PO TABS: 150.0000 mg | ORAL_TABLET | ORAL | Status: DC

## 2011-05-08 ENCOUNTER — Ambulatory Visit
Admission: RE | Admit: 2011-05-08 | Discharge: 2011-05-08 | Disposition: A | Discharge status: Home / Self Care | Payer: Medicare Other | Source: Ambulatory Visit | Attending: Oncology | Admitting: Oncology

## 2011-05-08 DIAGNOSIS — Z853 Personal history of malignant neoplasm of breast: Secondary | ICD-10-CM

## 2011-05-20 ENCOUNTER — Telehealth: Payer: Self-pay | Admitting: *Deleted

## 2011-05-20 NOTE — Telephone Encounter (Signed)
Pt left message [facility] trying to refill rx  Carvedolil 25mg  with no luck.  I [facility] no [facility].  Advised pt same.  Please review and refill with [facility] [place] [phone]

## 2011-05-20 NOTE — Telephone Encounter (Signed)
Kristin Willis called back and said 1 1/2 tabs by mouth twice daily of the 25 mg tablet which totals 37.5 mg po bid.

## 2011-05-21 ENCOUNTER — Other Ambulatory Visit: Payer: Self-pay | Admitting: *Deleted

## 2011-05-21 DIAGNOSIS — I10 Essential (primary) hypertension: Secondary | ICD-10-CM

## 2011-05-21 MED ORDER — CARVEDILOL 25 MG PO TABS: 37.5000 mg | ORAL_TABLET | Freq: Two times a day (BID) | ORAL | Status: DC

## 2011-05-21 NOTE — Telephone Encounter (Signed)
Called in carvedilol 25mg  take 37.5mg  BID #90 with 1 RF to [facility] [place].

## 2011-06-11 ENCOUNTER — Encounter (INDEPENDENT_AMBULATORY_CARE_PROVIDER_SITE_OTHER): Payer: Self-pay | Admitting: Surgery

## 2011-06-11 ENCOUNTER — Ambulatory Visit (INDEPENDENT_AMBULATORY_CARE_PROVIDER_SITE_OTHER): Payer: Medicare Other | Admitting: Surgery

## 2011-06-11 VITALS — BP 118/80 | HR 72 | Temp 98.4°F | Resp 12 | Ht 67.0 in | Wt 225.0 lb

## 2011-06-11 DIAGNOSIS — Z853 Personal history of malignant neoplasm of breast: Secondary | ICD-10-CM

## 2011-06-11 NOTE — Patient Instructions (Signed)
Follow-up with primary care

## 2011-06-11 NOTE — Progress Notes (Signed)
Subjective:     Patient ID: Kristin Willis, female   DOB: [birth year], 77 y.o.   MRN: [id number]  HPI The patient is 4 yrs out from left breast lumpectomy with SLN mapping for a T1N0MX. She is doing well. She denies any breast pain, breast mass or breast change.  Review of Systems  Constitutional: Negative.   HENT: Negative.   Eyes: Negative.   Respiratory: Negative.   Cardiovascular: Negative.   Neurological: Positive for numbness.       Objective:   Physical Exam  Constitutional: She appears well-developed and well-nourished.  HENT:  Head: Normocephalic and atraumatic.  Pulmonary/Chest: Effort normal and breath sounds normal.       LEFT BREAST WITH POST SURGICAL CHANGES.  NO BREAST MASS BILATERALLY.  BOTH AXILLA NORMAL.  Skin: Skin is warm and dry.        Assessment:     Stage 1 left breast cancer    Plan:     She is 4 years out and stable.  No further follow up needed here.  Cont follow up with primary care.

## 2011-06-16 ENCOUNTER — Telehealth: Payer: Self-pay | Admitting: Family Medicine

## 2011-06-16 MED ORDER — LOSARTAN POTASSIUM 100 MG PO TABS: 100.0000 mg | ORAL_TABLET | Freq: Two times a day (BID) | ORAL | Status: DC

## 2011-06-17 LAB — COMPREHENSIVE METABOLIC PANEL
ALT: 18
BUN: 21
CO2: 26
Calcium: 9
GFR calc non Af Amer: 29 — ABNORMAL LOW
Glucose, Bld: 153 — ABNORMAL HIGH
Sodium: 136
Total Protein: 5.9 — ABNORMAL LOW

## 2011-06-17 LAB — COMPREHENSIVE METABOLIC PANEL WITH GFR
AST: 25
Albumin: 3.2 — ABNORMAL LOW
Alkaline Phosphatase: 39
Chloride: 102
Creatinine, Ser: 1.72 — ABNORMAL HIGH
GFR calc Af Amer: 35 — ABNORMAL LOW
Potassium: 4.2
Total Bilirubin: 0.8

## 2011-06-17 LAB — DIFFERENTIAL
Basophils Absolute: 0.1
Basophils Relative: 2 — ABNORMAL HIGH
Eosinophils Absolute: 0.2
Eosinophils Relative: 3
Lymphocytes Relative: 28
Lymphs Abs: 2.3
Monocytes Absolute: 0.5
Monocytes Relative: 6
Neutro Abs: 5.1
Neutrophils Relative %: 62

## 2011-06-17 LAB — CBC
HCT: 39
Hemoglobin: 12.9
MCHC: 33.2
MCV: 94.8
Platelets: 236
RBC: 4.11
RDW: 14
WBC: 8.2

## 2011-06-17 NOTE — Telephone Encounter (Signed)
DONE

## 2011-06-18 ENCOUNTER — Other Ambulatory Visit: Payer: Self-pay | Admitting: Family Medicine

## 2011-06-18 LAB — BASIC METABOLIC PANEL
BUN: 17
CO2: 27
CO2: 27
Chloride: 104
Chloride: 104
Chloride: 106
Creatinine, Ser: 0.74
GFR calc Af Amer: >60
Glucose, Bld: 168 — ABNORMAL HIGH
Glucose, Bld: 184 — ABNORMAL HIGH
Potassium: 3.8
Potassium: 4
Sodium: 136
Sodium: 137

## 2011-06-18 LAB — CBC
HCT: 27.2 — ABNORMAL LOW
HCT: 29.7 — ABNORMAL LOW
Hemoglobin: 10.2 — ABNORMAL LOW
Hemoglobin: 9.4 — ABNORMAL LOW
MCHC: 33.7
MCHC: 34.4
MCV: 99
MCV: 99.1
MCV: 99.6
Platelets: 109 — ABNORMAL LOW
RBC: 3.49 — ABNORMAL LOW
RDW: 13.4
WBC: 7.9

## 2011-06-18 LAB — GLUCOSE, CAPILLARY
Glucose-Capillary: 135 — ABNORMAL HIGH
Glucose-Capillary: 163 — ABNORMAL HIGH
Glucose-Capillary: 164 — ABNORMAL HIGH
Glucose-Capillary: 168 — ABNORMAL HIGH
Glucose-Capillary: 188 — ABNORMAL HIGH

## 2011-06-18 LAB — HEMOGLOBIN A1C: Hgb A1c MFr Bld: 6.3 — ABNORMAL HIGH

## 2011-06-18 LAB — PROTIME-INR: Prothrombin Time: 17.4 — ABNORMAL HIGH

## 2011-06-23 ENCOUNTER — Other Ambulatory Visit: Payer: Self-pay | Admitting: Family Medicine

## 2011-06-27 ENCOUNTER — Encounter: Payer: Self-pay | Admitting: Family Medicine

## 2011-06-30 ENCOUNTER — Other Ambulatory Visit: Payer: Self-pay | Admitting: Family Medicine

## 2011-06-30 LAB — BASIC METABOLIC PANEL
BUN: 29 — ABNORMAL HIGH
Calcium: 9.8
Creatinine, Ser: 0.91
GFR calc non Af Amer: >60
Glucose, Bld: 116 — ABNORMAL HIGH
Potassium: 3.9

## 2011-06-30 LAB — DIFFERENTIAL
Basophils Absolute: 0.1
Eosinophils Relative: 4
Lymphocytes Relative: 37
Lymphs Abs: 2.4
Neutro Abs: 3.3
Neutrophils Relative %: 50

## 2011-06-30 LAB — CBC
HCT: 42.2
Platelets: 226
RDW: 12.9
WBC: 6.5

## 2011-08-04 ENCOUNTER — Other Ambulatory Visit: Payer: Medicare Other

## 2011-08-04 ENCOUNTER — Telehealth: Payer: Self-pay | Admitting: *Deleted

## 2011-08-04 DIAGNOSIS — E119 Type 2 diabetes mellitus without complications: Secondary | ICD-10-CM

## 2011-08-04 DIAGNOSIS — E7800 Pure hypercholesterolemia, unspecified: Secondary | ICD-10-CM

## 2011-08-04 LAB — LIPID PANEL
HDL: 39 mg/dL — ABNORMAL LOW (ref >39)
LDL Cholesterol: 61 mg/dL (ref 0–99)
Total CHOL/HDL Ratio: 3.3 Ratio

## 2011-08-04 NOTE — Telephone Encounter (Signed)
I prefer to wait to evaluate her at [facility] on Wednesday, especially if symptoms have been for less than 7 days.

## 2011-08-04 NOTE — Telephone Encounter (Signed)
Patient was in this am for labs. She has an appt with you on Wed 08/06/11. She wanted me to ask you if you could possibly call her in a Zpac? She is having very bad congestion with yellow mucous. The mucous is both in her throat and lungs, she said. She said usually that a Zpac takes care of it. Please let me know. Thanks.

## 2011-08-04 NOTE — Telephone Encounter (Signed)
Patient informed that Dr.Knapp would like to evaluate at visit this Wednesday. Pt verbalized understanding.

## 2011-08-05 LAB — HEMOGLOBIN A1C: Hgb A1c MFr Bld: 6.7 % — ABNORMAL HIGH (ref <5.7)

## 2011-08-06 ENCOUNTER — Encounter: Payer: Self-pay | Admitting: Family Medicine

## 2011-08-06 ENCOUNTER — Ambulatory Visit (INDEPENDENT_AMBULATORY_CARE_PROVIDER_SITE_OTHER): Payer: Medicare Other | Admitting: Family Medicine

## 2011-08-06 DIAGNOSIS — J069 Acute upper respiratory infection, unspecified: Secondary | ICD-10-CM

## 2011-08-06 DIAGNOSIS — M109 Gout, unspecified: Secondary | ICD-10-CM | POA: Insufficient documentation

## 2011-08-06 DIAGNOSIS — E119 Type 2 diabetes mellitus without complications: Secondary | ICD-10-CM

## 2011-08-06 DIAGNOSIS — I10 Essential (primary) hypertension: Secondary | ICD-10-CM

## 2011-08-06 DIAGNOSIS — E7800 Pure hypercholesterolemia, unspecified: Secondary | ICD-10-CM

## 2011-08-06 MED ORDER — AZITHROMYCIN 250 MG PO TABS: ORAL_TABLET | ORAL | Status: AC

## 2011-08-06 NOTE — Progress Notes (Signed)
Patient presents for 6 month follow up, labs done 08/04/11. Also has had cough and congestion x  7 days, coughing up some blood. Would  like a Zpac.  She also brings in [facility] handicapped placard to be filled out (she brought in her husband's renewal, and crossed out his name).  Patient is complaining of chest congestion, getting up yellow phlegm, sometimes blood-streaked. Denies fevers.  Having sinus headaches in both cheeks, sore throat and hoarseness.  Has gargled with Listerine and used chloraseptic spray, has not used any other medications to treat her symptoms.  Denies sick contacts.  Requesting z-pak.  Diabetes follow-up:  Blood sugars at home are running 109-135, checking once a week. Denies polydipsia and polyuria.  Has some urinary frequency (especially at night), being treated by urologist with needle in foot).  Last eye exam was August 2012.  Patient follows a low sugar diet and checks feet regularly without concerns.  Continues to walk some, and does water aerobics once a week  Lab Results  Component Value Date   HGBA1C 6.7* 08/04/2011   Hypertension follow-up:  Blood pressures elsewhere are 120/70's (didn't bring in list, just trying to recall from memory).  Denies dizziness, headaches, chest pain, edema.  Denies side effects of medications.  Hyperlipidemia follow-up:  Patient is reportedly following a low-fat, low cholesterol diet.  Compliant with medications and denies medication side effects.  Lab Results  Component Value Date   CHOL 130 08/04/2011   HDL 39* 08/04/2011   LDLCALC 61 08/04/2011   TRIG 152* 08/04/2011   CHOLHDL 3.3 08/04/2011   Past Medical History  Diagnosis Date  . Incontinence   . Wears glasses   . Arthritis     right knee, left wrist  . Diabetes mellitus     diet controlled  . Hypertension   . Asthma   . Obesity, unspecified   . Dyslipidemia   . Gout   . DJD (degenerative joint disease)   . Endometrial cancer hx  . Diabetic retinopathy   .  Membranous glomerulonephropathy   . Osteopenia L hip, 4/06  . DDD (degenerative disc disease), lumbar 4/06  . Sliding hiatal hernia small sliding hiatal hernia  . Breast cancer 7/08 Inrasine ductal L breast  . Hepatitis A history(in [place]-1950's)  . Mini stroke DrWillis  . Colon polyps     4/09--adenomatous. 04/2010--normal.  Repeat 2014    Past Surgical History  Procedure Date  . Breast lumpectomy     left  . Abdominal hysterectomy     endometrial cancer  . Kidney stones   . Left middle finger   . Skin cancer excision   . Total knee arthroplasty     left  . Cataract extraction   . Breast lumpectomy 8/08 L Breast ([name])  . Colonoscopy 8/08 [name] (normal)  . Replacement total knee Left  . Appendectomy     History   Social History  . Marital Status: Married    Spouse Name: N/A    Number of Children: N/A  . Years of Education: N/A   Occupational History  . Not on file.   Social History Main Topics  . Smoking status: Never Smoker   . Smokeless tobacco: Never Used  . Alcohol Use: No  . Drug Use: No  . Sexually Active: Not on file   Other Topics Concern  . Not on file   Social History Narrative  . No narrative on file   Family History  Problem Relation Age of  Onset  . Heart attack Father   . Diabetes Father   . Heart attack Mother   . Hypertension Mother   . Diabetes Mother   . Hypertension Brother   . Melanoma Brother    Current Outpatient Prescriptions on File Prior to Visit  Medication Sig Dispense Refill  . acetaminophen (TYLENOL) 500 MG tablet Take 1,000 mg by mouth 2 (two) times daily.        Marland Kitchen allopurinol (ZYLOPRIM) 300 MG tablet TAKE 1/2 TABLET DAILY  90 tablet  2  . amLODipine (NORVASC) 5 MG tablet TAKE 1 TABLET BY MOUTH EVERY DAY  30 tablet  2  . B Complex-C (SUPER B COMPLEX PO) Take 1 capsule by mouth daily.        . calcium citrate-vitamin D (CITRACAL+D) 315-200 MG-UNIT per tablet Take 2 tablets by mouth daily.        . carvedilol  (COREG) 25 MG tablet Take 1.5 tablets (37.5 mg total) by mouth 2 (two) times daily with a meal.  90 tablet  2  . clindamycin (CLEOCIN) 150 MG capsule Take 600 mg by mouth See admin instructions. 1 hour prior to teeth cleaning       . clopidogrel (PLAVIX) 75 MG tablet Take 75 mg by mouth daily.        . Evening Primrose Oil CAPS Take 1 capsule by mouth daily.        Marland Kitchen ezetimibe (ZETIA) 10 MG tablet Take 1 tablet (10 mg total) by mouth daily.  30 tablet  5  . Glucosamine-Chondroit-Vit C-Mn (GLUCOSAMINE CHONDR 500 COMPLEX PO) Take 2 tablets by mouth 2 (two) times daily.        . hydrochlorothiazide (,MICROZIDE/HYDRODIURIL,) 12.5 MG capsule Take 12.5 mg by mouth daily.        Marland Kitchen ibandronate (BONIVA) 150 MG tablet Take 1 tablet (150 mg total) by mouth every 30 (thirty) days. Take in the morning with a full glass of water, on an empty stomach, and do not take anything else by mouth or lie down for the next 30 min.  1 tablet  11  . KLOR-CON 10 10 MEQ CR tablet TAKE 1 TABLET BY MOUTH EVERY DAY  30 tablet  4  . Krill Oil 300 MG CAPS Take 1 capsule by mouth daily.        Marland Kitchen lisinopril (PRINIVIL,ZESTRIL) 10 MG tablet Take 10 mg by mouth daily.        Marland Kitchen losartan (COZAAR) 100 MG tablet Take 1 tablet (100 mg total) by mouth 2 (two) times daily.  180 tablet  0  . solifenacin (VESICARE) 5 MG tablet Take 10 mg by mouth daily.          Allergies  Allergen Reactions  . Codeine Nausea And Vomiting  . Contrast Media (Iodinated Diagnostic Agents) Nausea And Vomiting  . Morphine And Related Nausea And Vomiting  . Statins Other (See Comments)    lethargic  . Penicillins Hives, Swelling and Rash   ROS:  16 pound weight loss in the last 5 months, intentional.  Denies headaches, dizziness, chest pain, edema, GI complaints, dysuria, or other concerns. Denies any skin concerns or rashes. +persistent numbness in left foot since "mini stroke".  R knee pain, can't completely straighten--needs to walk with cane.  PHYSICAL  EXAM: BP 108/64  Pulse 72  Temp(Src) 98 F (36.7 C) (Oral)  Ht 5\' 7"  (1.702 m)  Wt 216 lb (97.977 kg)  BMI 33.83 kg/m2 HEENT: PERRL, EOMI, conjunctiva clear.  TM's and EAC's normal.  Nasal mucosa is mildly edematous with clear drainage. OP normal without erythema or exudate.  Sinuses--very mild tenderness over cheeks bilaterally Neck: No lymphadenopathy or thyromegaly, no carotid bruit  Heart: Regular rate and rhythm, no murmurs, rubs, gallops or ectopy  Lungs: Clear bilaterally, without wheezes, rales or ronchi  Extremities: No clubbing, or cyanosis, 2+ pulses. Trace edema  Neuro: Alert and oriented x 3, cranial nerves grossly intact.  Back: No spine or CVA tenderness  Skin: no rashes or suspicious lesions  Psych: Normal mood, affect, hygiene and grooming, normal speech, eye contact  ASSESSMENT/PLAN:  1. Pure hypercholesterolemia  Lipid panel  2. Type II or unspecified type diabetes mellitus without mention of complication, not stated as uncontrolled  Comprehensive metabolic panel, Hemoglobin A1c  3. Essential hypertension, benign  Comprehensive metabolic panel  4. Gout  Uric acid  5. URI (upper respiratory infection)  azithromycin (ZITHROMAX Z-PAK) 250 MG tablet   viral versus early sinus infection.  Patient requesting antibiotics   HTN--well controlled Lipids--HDL slightly low, TG borderline, but overall doing well with Zetia, continue DM--despite losing weight, A1c remains unchanged.  My recommendation is to start medications when A1c is >6.5, but patient declines any medications to treat diabetes.  Continue weight loss, exercise and limiting sugar and carbs in diet URI--supportive measures reviewed.  Start Z-pak if not improving in the next few days (she is going to start today regardless of my recommendation, per patient)

## 2011-08-06 NOTE — Patient Instructions (Addendum)
Continue to try and exercise every day. Continue to try and avoid sugars and limit the carbohydrates in your diet  Make sure you're drinking plenty of fluids Guaifenesin (in Mucinex or Robitussin) will help loosen the phlegm, and decrease chest congestion and cough. Sinus rinses (or Neti-pot) can help reduce sinus pressure in your cheeks  If symptoms are not improving despite these measures over the next few days, then start the antibiotics

## 2011-08-12 ENCOUNTER — Other Ambulatory Visit: Payer: Self-pay | Admitting: Family Medicine

## 2011-08-19 ENCOUNTER — Other Ambulatory Visit: Payer: Self-pay | Admitting: Family Medicine

## 2011-08-25 ENCOUNTER — Other Ambulatory Visit: Payer: Self-pay | Admitting: Oncology

## 2011-08-25 ENCOUNTER — Other Ambulatory Visit (HOSPITAL_BASED_OUTPATIENT_CLINIC_OR_DEPARTMENT_OTHER): Payer: Medicare Other

## 2011-08-25 DIAGNOSIS — C50919 Malignant neoplasm of unspecified site of unspecified female breast: Secondary | ICD-10-CM

## 2011-08-25 LAB — CBC WITH DIFFERENTIAL/PLATELET
BASO%: 1.5 % (ref 0.0–2.0)
Basophils Absolute: 0.1 10*3/uL (ref 0.0–0.1)
EOS%: 5.1 % (ref 0.0–7.0)
HCT: 41.1 % (ref 34.8–46.6)
HGB: 14.1 g/dL (ref 11.6–15.9)
LYMPH%: 44.4 % (ref 14.0–49.7)
MCH: 34.6 pg — ABNORMAL HIGH (ref 25.1–34.0)
MCHC: 34.3 g/dL (ref 31.5–36.0)
MCV: 100.9 fL (ref 79.5–101.0)
MONO%: 8.7 % (ref 0.0–14.0)
NEUT%: 40.3 % (ref 38.4–76.8)
Platelets: 190 10*3/uL (ref 145–400)
lymph#: 2.6 10*3/uL (ref 0.9–3.3)

## 2011-08-25 LAB — COMPREHENSIVE METABOLIC PANEL
ALT: 23 U/L (ref 0–35)
AST: 26 U/L (ref 0–37)
Alkaline Phosphatase: 38 U/L — ABNORMAL LOW (ref 39–117)
BUN: 30 mg/dL — ABNORMAL HIGH (ref 6–23)
Calcium: 9.9 mg/dL (ref 8.4–10.5)
Creatinine, Ser: 0.9 mg/dL (ref 0.50–1.10)
Total Bilirubin: 0.8 mg/dL (ref 0.3–1.2)

## 2011-09-01 ENCOUNTER — Ambulatory Visit (HOSPITAL_BASED_OUTPATIENT_CLINIC_OR_DEPARTMENT_OTHER): Payer: Medicare Other | Admitting: Oncology

## 2011-09-01 ENCOUNTER — Telehealth: Payer: Self-pay | Admitting: *Deleted

## 2011-09-01 VITALS — BP 136/64 | HR 78 | Temp 98.2°F | Ht 67.5 in | Wt 230.2 lb

## 2011-09-01 DIAGNOSIS — Z79811 Long term (current) use of aromatase inhibitors: Secondary | ICD-10-CM

## 2011-09-01 DIAGNOSIS — Z170 Estrogen receptor positive status [ER+]: Secondary | ICD-10-CM

## 2011-09-01 DIAGNOSIS — C50919 Malignant neoplasm of unspecified site of unspecified female breast: Secondary | ICD-10-CM | POA: Insufficient documentation

## 2011-09-01 NOTE — Telephone Encounter (Signed)
gave patient appointment for 01-2012 printed out calendar and gave to the patient 

## 2011-09-01 NOTE — Progress Notes (Signed)
ID: Kristin Willis   Interval History: Kristin Willis returns today for followup of her breast cancer. She looks terrific, has a new hairdo, and also has her first great-grandchild, now 2 months old. She turned 77 and had a nice family party with her 5 grandchildren and the rest of the family. She is tolerating the letrozole with no side effects that she is aware of.  ROS  she feels a little tired at times. She doesn't think this is more than anyone else was 77 would feel. Aim for muscle aches and cramps. Occasional sinus problems were dealt with with over-the-counter medications. She has had a little change in her stool habits, with diarrhea occasionally sometimes 2 or 3 times daily depending on whether she is onions or not. There has been some abdominal cramping with this. She has had no bleeding. Recall she had a colonoscopy within the last year. She continues to have some arthritis here and there but no increase in joint pain related to the letrozole. She is urinary incontinence which is being treated through Dr. Perley Willis. Her diabetes is moderately well-controlled. Otherwise a detailed review of systems today was noncontributory.  Medications: I have reviewed the patient's current medications.  Current Outpatient Prescriptions  Medication Sig Dispense Refill  . acetaminophen (TYLENOL) 500 MG tablet Take 1,000 mg by mouth 2 (two) times daily.        Marland Kitchen allopurinol (ZYLOPRIM) 300 MG tablet TAKE 1/2 TABLET DAILY  90 tablet  2  . B Complex-Willis (SUPER B COMPLEX PO) Take 1 capsule by mouth daily.        . carvedilol (COREG) 25 MG tablet TAKE 1 & 1/2 TABLETS BY MOUTH TWICE A DAY  90 tablet  2  . clopidogrel (PLAVIX) 75 MG tablet Take 75 mg by mouth daily.        . Evening Primrose Oil CAPS Take 1 capsule by mouth daily.        Marland Kitchen ezetimibe (ZETIA) 10 MG tablet Take 1 tablet (10 mg total) by mouth daily.  30 tablet  5  . Glucosamine-Chondroit-Vit Willis-Mn (GLUCOSAMINE CHONDR 500 COMPLEX PO) Take 2 tablets by mouth 2  (two) times daily.        . hydrochlorothiazide (,MICROZIDE/HYDRODIURIL,) 12.5 MG capsule Take 12.5 mg by mouth daily.        Marland Kitchen ibandronate (BONIVA) 150 MG tablet Take 1 tablet (150 mg total) by mouth every 30 (thirty) days. Take in the morning with a full glass of water, on an empty stomach, and do not take anything else by mouth or lie down for the next 30 min.  1 tablet  11  . KLOR-CON 10 10 MEQ CR tablet TAKE 1 TABLET BY MOUTH EVERY DAY  30 tablet  4  . Krill Oil 300 MG CAPS Take 1 capsule by mouth daily.        Marland Kitchen letrozole (FEMARA) 2.5 MG tablet Take 1 tablet by mouth Once daily.      Marland Kitchen losartan (COZAAR) 100 MG tablet Take 1 tablet (100 mg total) by mouth 2 (two) times daily.  180 tablet  0  . losartan (COZAAR) 100 MG tablet TAKE 1 TABLET BY MOUTH TWICE A DAY  60 tablet  5  . solifenacin (VESICARE) 5 MG tablet Take 10 mg by mouth daily.        Marland Kitchen amLODipine (NORVASC) 5 MG tablet TAKE 1 TABLET BY MOUTH EVERY DAY  30 tablet  2  . calcium citrate-vitamin D (CITRACAL+D) 315-200 MG-UNIT per  tablet Take 2 tablets by mouth daily.        . clindamycin (CLEOCIN) 150 MG capsule Take 600 mg by mouth See admin instructions. 1 hour prior to teeth cleaning       . lisinopril (PRINIVIL,ZESTRIL) 10 MG tablet Take 10 mg by mouth daily.           Objective:  Filed Vitals:   09/01/11 1025  BP: 136/64  Pulse: 78  Temp: 98.2 F (36.8 Willis)   Body mass index is 35.52 kg/(m^2).  Physical Exam:    Sclerae unicteric  Oropharynx clear  No peripheral adenopathy  Lungs clear -- no rales or rhonchi  Heart regular rate and rhythm  Abdomen benign  MSK no focal spinal tenderness, no peripheral edema  Neuro nonfocal  Breast exam: Right breast no suspicious masses; left breast status post lumpectomy; no evidence of local recurrence  Lab Results:  CA 27-29 is normal at 29.  CMP    Chemistry      Component Value Date/Time   NA 141 08/25/2011 1017   NA 141 08/25/2011 1017   K 4.1 08/25/2011 1017   K  4.1 08/25/2011 1017   CL 105 08/25/2011 1017   CL 105 08/25/2011 1017   CO2 28 08/25/2011 1017   CO2 28 08/25/2011 1017   BUN 30* 08/25/2011 1017   BUN 30* 08/25/2011 1017   CREATININE 0.90 08/25/2011 1017   CREATININE 0.90 08/25/2011 1017      Component Value Date/Time   CALCIUM 9.9 08/25/2011 1017   CALCIUM 9.9 08/25/2011 1017   ALKPHOS 38* 08/25/2011 1017   ALKPHOS 38* 08/25/2011 1017   AST 26 08/25/2011 1017   AST 26 08/25/2011 1017   ALT 23 08/25/2011 1017   ALT 23 08/25/2011 1017   BILITOT 0.8 08/25/2011 1017   BILITOT 0.8 08/25/2011 1017       CBC Lab Results  Component Value Date   WBC 5.8 08/25/2011   HGB 14.1 08/25/2011   HCT 41.1 08/25/2011   MCV 100.9 08/25/2011   PLT 190 08/25/2011   NEUTROABS 2.3 08/25/2011    Studies/Results:  Mammography in 12-07-10 was unremarkable  Assessment: 77 year old [place] woman status post left lumpectomy and sentinel lymph node biopsy August of 2008 for a T1 N0 (Stage I) invasive ductal carcinoma, grade 2, strongly estrogen receptor and progesterone receptor positive, HER-2 negative, with a borderline MIB-1. She opted to forego radiation and started tamoxifen September of 2008. After a TIA January 2012 she was switched to Femara on which she continues with good tolerance.   Plan: Kristin Willis going to continue the letrozole on to August of next year which is when she will see me next. At that time assuming all continues well we will release her from followup.  She tells me Dr Kristin Willis  is considering starting her on a medication for diabetes. If this is metformin, I would favor it because it may also benefit  her from a breast cancer point of view. Of course this may cause diarrhea and she is already having some stomach problems. She will continue to discuss this with her primary care physician.  Kristin Willis 09/01/2011

## 2011-09-13 ENCOUNTER — Other Ambulatory Visit: Payer: Self-pay | Admitting: Family Medicine

## 2011-09-23 ENCOUNTER — Other Ambulatory Visit: Payer: Self-pay | Admitting: Family Medicine

## 2011-11-18 ENCOUNTER — Other Ambulatory Visit: Payer: Self-pay | Admitting: Family Medicine

## 2011-11-25 ENCOUNTER — Other Ambulatory Visit: Payer: Self-pay | Admitting: Family Medicine

## 2011-11-25 NOTE — Telephone Encounter (Signed)
Patient needs schedule and office visit before refills are up.

## 2011-12-09 ENCOUNTER — Encounter: Payer: Self-pay | Admitting: Internal Medicine

## 2011-12-16 ENCOUNTER — Other Ambulatory Visit: Payer: Self-pay | Admitting: Oncology

## 2011-12-16 DIAGNOSIS — C50919 Malignant neoplasm of unspecified site of unspecified female breast: Secondary | ICD-10-CM

## 2011-12-17 ENCOUNTER — Ambulatory Visit (INDEPENDENT_AMBULATORY_CARE_PROVIDER_SITE_OTHER): Payer: Medicare Other | Admitting: Family Medicine

## 2011-12-17 ENCOUNTER — Encounter: Payer: Self-pay | Admitting: Family Medicine

## 2011-12-17 VITALS — BP 130/78 | HR 68 | Ht 67.0 in | Wt 231.0 lb

## 2011-12-17 DIAGNOSIS — E7800 Pure hypercholesterolemia, unspecified: Secondary | ICD-10-CM

## 2011-12-17 DIAGNOSIS — I10 Essential (primary) hypertension: Secondary | ICD-10-CM

## 2011-12-17 DIAGNOSIS — E119 Type 2 diabetes mellitus without complications: Secondary | ICD-10-CM

## 2011-12-17 DIAGNOSIS — M109 Gout, unspecified: Secondary | ICD-10-CM

## 2011-12-17 NOTE — Progress Notes (Signed)
Patient presents for follow up on hypertension, diabetes and hyperlipidemia.  Originally, this was supposed to be a 3 month check just on DM (to see if she needs meds if A1c remains >6.5), but she is late in returning.  Due to return the end of May, but she has a lot of visitors coming then, and she prefers to get all the labs done today (fasting today). Was asked to return earlier based on higher A1c done by her nephrologist (report not currently available to me at time of visit).  She regained the weight that she lost.  Recalls not liking Weight Watchers when she did it in the past, ended gaining weight. Didn't like Nutrasystem either. She does water aerobics just once a week, and does some walking (grocery stores, etc, not outside).    Hypertension follow-up:  Blood pressures elsewhere run 145/70 yesterday, but didn't bring list of her blood pressures, and can't remember the other numbers.  Denies dizziness, headaches, chest pain.  Denies side effects of medications.  Diabetes follow-up:  Blood sugars at home are running 120's-130, max of 150.  Denies hypoglycemia.  Denies polydipsia and polyuria.  Last eye exam was in August with Dr. Luciana Axe.  Patient follows a low sugar diet and checks feet regularly without concerns. Can't see the bottom of her feet, but gets pedicures once a month. Denies numbness or pain in feet.  Hyperlipidemia follow-up:  Patient is reportedly following a low-fat, low cholesterol diet.  Compliant with medications and denies medication side effects  Has been on Plavix for about a year, per Dr. Anne Hahn, for h/o mini-stroke.  She is due to see him again soon.  She recently saw Dr. Eliott Nine, was treated for a UTI.  No other meds were changed.  Past Medical History  Diagnosis Date  . Incontinence   . Wears glasses   . Arthritis     right knee, left wrist  . Diabetes mellitus     diet controlled  . Hypertension   . Asthma   . Obesity, unspecified   . Dyslipidemia   . Gout     . DJD (degenerative joint disease)   . Endometrial cancer hx  . Diabetic retinopathy   . Membranous glomerulonephropathy   . Osteopenia L hip, 4/06  . DDD (degenerative disc disease), lumbar 4/06  . Sliding hiatal hernia small sliding hiatal hernia  . Breast cancer 7/08 Inrasine ductal L breast  . Hepatitis A history(in [place]-1950's)  . Mini stroke DrWillis  . Colon polyps     4/09--adenomatous. 04/2010--normal.  Repeat 2014    Past Surgical History  Procedure Date  . Breast lumpectomy     left  . Abdominal hysterectomy     endometrial cancer  . Kidney stones   . Left middle finger   . Skin cancer excision   . Total knee arthroplasty     left  . Cataract extraction   . Breast lumpectomy 8/08 L Breast ([name])  . Colonoscopy 8/08 [name] (normal)  . Replacement total knee Left  . Appendectomy     History   Social History  . Marital Status: Married    Spouse Name: N/A    Number of Children: N/A  . Years of Education: N/A   Occupational History  . Not on file.   Social History Main Topics  . Smoking status: Never Smoker   . Smokeless tobacco: Never Used  . Alcohol Use: No  . Drug Use: No  . Sexually Active: Not  on file   Other Topics Concern  . Not on file   Social History Narrative  . No narrative on file    Family History  Problem Relation Age of Onset  . Heart attack Father   . Diabetes Father   . Heart attack Mother   . Hypertension Mother   . Diabetes Mother   . Hypertension Brother   . Melanoma Brother     Current outpatient prescriptions:acetaminophen (TYLENOL) 500 MG tablet, Take 1,000 mg by mouth 2 (two) times daily.  , Disp: , Rfl: ;  allopurinol (ZYLOPRIM) 300 MG tablet, TAKE 1/2 TABLET DAILY, Disp: 90 tablet, Rfl: 2;  amLODipine (NORVASC) 5 MG tablet, TAKE 1 TABLET BY MOUTH EVERY DAY, Disp: 30 tablet, Rfl: 2;  B Complex-C (SUPER B COMPLEX PO), Take 1 capsule by mouth daily.  , Disp: , Rfl:  calcium citrate-vitamin D (CITRACAL+D)  315-200 MG-UNIT per tablet, Take 2 tablets by mouth daily.  , Disp: , Rfl: ;  carvedilol (COREG) 25 MG tablet, TAKE 1 & 1/2 TABLETS BY MOUTH TWICE A DAY, Disp: 90 tablet, Rfl: 1;  Cholecalciferol (VITAMIN D) 2000 UNITS tablet, Take 2,000 Units by mouth daily., Disp: , Rfl: ;  clindamycin (CLEOCIN) 150 MG capsule, Take 600 mg by mouth See admin instructions. 1 hour prior to teeth cleaning , Disp: , Rfl:  clopidogrel (PLAVIX) 75 MG tablet, Take 75 mg by mouth daily.  , Disp: , Rfl: ;  Coenzyme Q10 (CO Q 10 PO), Take 2 tablets by mouth daily., Disp: , Rfl: ;  Evening Primrose Oil CAPS, Take 1 capsule by mouth daily.  , Disp: , Rfl: ;  Glucosamine-Chondroit-Vit C-Mn (GLUCOSAMINE CHONDR 500 COMPLEX PO), Take 2 tablets by mouth 2 (two) times daily.  , Disp: , Rfl:  hydrochlorothiazide (,MICROZIDE/HYDRODIURIL,) 12.5 MG capsule, Take 12.5 mg by mouth daily.  , Disp: , Rfl: ;  ibandronate (BONIVA) 150 MG tablet, Take 1 tablet (150 mg total) by mouth every 30 (thirty) days. Take in the morning with a full glass of water, on an empty stomach, and do not take anything else by mouth or lie down for the next 30 min., Disp: 1 tablet, Rfl: 11 KLOR-CON 10 10 MEQ tablet, TAKE 1 TABLET BY MOUTH EVERY DAY, Disp: 30 tablet, Rfl: 4;  Krill Oil 500 MG CAPS, Take 1 capsule by mouth daily., Disp: , Rfl: ;  letrozole (FEMARA) 2.5 MG tablet, TAKE 1 TABLET BY MOUTH EVERY DAY, Disp: 30 tablet, Rfl: 4;  lisinopril (PRINIVIL,ZESTRIL) 20 MG tablet, Take 20 mg by mouth daily., Disp: , Rfl: ;  losartan (COZAAR) 100 MG tablet, TAKE 1 TABLET BY MOUTH TWICE A DAY, Disp: 60 tablet, Rfl: 5 solifenacin (VESICARE) 5 MG tablet, Take 10 mg by mouth daily.  , Disp: , Rfl: ;  ZETIA 10 MG tablet, TAKE 1 TABLET BY MOUTH EVERY DAY, Disp: 30 tablet, Rfl: 5;  DISCONTD: losartan (COZAAR) 100 MG tablet, Take 1 tablet (100 mg total) by mouth 2 (two) times daily., Disp: 180 tablet, Rfl: 0  Allergies  Allergen Reactions  . Codeine Nausea And Vomiting  .  Contrast Media (Iodinated Diagnostic Agents) Nausea And Vomiting  . Morphine And Related Nausea And Vomiting  . Statins Other (See Comments)    lethargic  . Penicillins Hives, Swelling and Rash   ROS:  Denies fevers, URI symptoms, shortness of breath, chest pain, edema, skin rash, myalgias, or other concerns. +L foot numbness, chronic, no change. No recent gout flares (none in about  5 years).  PHYSICAL EXAM: BP 130/78  Pulse 68  Ht 5\' 7"  (1.702 m)  Wt 231 lb (104.781 kg)  BMI 36.18 kg/m2 Well developed, obese female in no distress Neck: No lymphadenopathy or thyromegaly, no carotid bruit  Heart: Regular rate and rhythm, no murmurs, rubs, gallops or ectopy  Lungs: Clear bilaterally, without wheezes, rales or ronchi  Extremities: No clubbing, or cyanosis, 2+ pulses. Trace edema. Dry skin.  Normal diabetic foot exam. Neuro: Alert and oriented x 3, cranial nerves grossly intact.  Back: No spine or CVA tenderness  Skin: no rashes or suspicious lesions  Psych: Normal mood, affect, hygiene and grooming, normal speech, eye contact    Lab Results  Component Value Date   HGBA1C 6.4 12/17/2011    ASSESSMENT/LAN: 1. Type II or unspecified type diabetes mellitus without mention of complication, not stated as uncontrolled  POCT HgB A1C, Comprehensive metabolic panel  2. Pure hypercholesterolemia  Lipid panel  3. Essential hypertension, benign  Comprehensive metabolic panel  4. Gout  Uric acid   DM--A1c now <6.5, so can avoid starting medications. Weight loss discussed in detail (diet, portion control, exercise)  HTN--well controlled per BP today.  Continue monitoring at home and f/u if remain elevated at home. F/u 6 months, sooner if sugars are persistently >150, or if BP persistently >140/90.  Hyperlipidemia--due for labs Gout--well controlled, due for labs  F/u 6 months, sooner prn elevated sugars, blood pressures.

## 2011-12-17 NOTE — Patient Instructions (Addendum)
Follow up in 6 months, sooner if sugars are persistently >150, or if BP persistently >140/90. Please try and exercise at least 30 minutes daily. Try and avoid breads, cut back on carbs in general.  Limit your portion sizes.  Do not skip meals.  Drink plenty of water, and eat slowly (so you can sense when you're full, and eat less).  I don't recommend eating drive-through (only for salads).

## 2011-12-18 LAB — COMPREHENSIVE METABOLIC PANEL
AST: 20 U/L (ref 0–37)
Alkaline Phosphatase: 43 U/L (ref 39–117)
BUN: 26 mg/dL — ABNORMAL HIGH (ref 6–23)
Calcium: 10.6 mg/dL — ABNORMAL HIGH (ref 8.4–10.5)
Chloride: 103 mEq/L (ref 96–112)
Creat: 0.9 mg/dL (ref 0.50–1.10)
Glucose, Bld: 145 mg/dL — ABNORMAL HIGH (ref 70–99)

## 2011-12-18 LAB — LIPID PANEL
Cholesterol: 166 mg/dL (ref 0–200)
Total CHOL/HDL Ratio: 4.2 Ratio
Triglycerides: 271 mg/dL — ABNORMAL HIGH (ref <150)
VLDL: 54 mg/dL — ABNORMAL HIGH (ref 0–40)

## 2011-12-22 ENCOUNTER — Other Ambulatory Visit: Payer: Self-pay | Admitting: Family Medicine

## 2011-12-31 ENCOUNTER — Encounter: Payer: Self-pay | Admitting: Nephrology

## 2012-01-24 ENCOUNTER — Other Ambulatory Visit: Payer: Self-pay | Admitting: Family Medicine

## 2012-01-29 ENCOUNTER — Other Ambulatory Visit (HOSPITAL_BASED_OUTPATIENT_CLINIC_OR_DEPARTMENT_OTHER): Payer: Medicare Other | Admitting: Lab

## 2012-01-29 ENCOUNTER — Other Ambulatory Visit: Payer: Self-pay | Admitting: *Deleted

## 2012-01-29 DIAGNOSIS — C50919 Malignant neoplasm of unspecified site of unspecified female breast: Secondary | ICD-10-CM

## 2012-01-29 DIAGNOSIS — E119 Type 2 diabetes mellitus without complications: Secondary | ICD-10-CM

## 2012-01-29 LAB — CBC WITH DIFFERENTIAL/PLATELET
BASO%: 1.8 % (ref 0.0–2.0)
EOS%: 4.1 % (ref 0.0–7.0)
Eosinophils Absolute: 0.2 10*3/uL (ref 0.0–0.5)
LYMPH%: 44.6 % (ref 14.0–49.7)
MCH: 33.9 pg (ref 25.1–34.0)
MCHC: 33.5 g/dL (ref 31.5–36.0)
MCV: 101.2 fL — ABNORMAL HIGH (ref 79.5–101.0)
MONO%: 8.6 % (ref 0.0–14.0)
NEUT#: 2.2 10*3/uL (ref 1.5–6.5)
Platelets: 174 10*3/uL (ref 145–400)
RBC: 4.25 10*6/uL (ref 3.70–5.45)
RDW: 12.9 % (ref 11.2–14.5)
nRBC: 0 % (ref 0–0)

## 2012-01-29 LAB — COMPREHENSIVE METABOLIC PANEL
ALT: 14 U/L (ref 0–35)
Alkaline Phosphatase: 39 U/L (ref 39–117)
CO2: 29 mEq/L (ref 19–32)
Creatinine, Ser: 0.83 mg/dL (ref 0.50–1.10)
Sodium: 136 mEq/L (ref 135–145)
Total Bilirubin: 0.7 mg/dL (ref 0.3–1.2)
Total Protein: 6 g/dL (ref 6.0–8.3)

## 2012-01-29 LAB — CANCER ANTIGEN 27.29: CA 27.29: 21 U/mL (ref 0–39)

## 2012-02-05 ENCOUNTER — Telehealth: Payer: Self-pay | Admitting: Oncology

## 2012-02-05 NOTE — Telephone Encounter (Signed)
Pt called to cancel her may lab appts.

## 2012-02-06 ENCOUNTER — Other Ambulatory Visit: Payer: Medicare Other | Admitting: Lab

## 2012-02-10 ENCOUNTER — Other Ambulatory Visit: Payer: Medicare Other | Admitting: Lab

## 2012-02-17 ENCOUNTER — Other Ambulatory Visit: Payer: Self-pay | Admitting: Family Medicine

## 2012-02-28 ENCOUNTER — Other Ambulatory Visit: Payer: Self-pay | Admitting: Family Medicine

## 2012-03-19 ENCOUNTER — Other Ambulatory Visit: Payer: Self-pay | Admitting: Family Medicine

## 2012-03-22 NOTE — Telephone Encounter (Signed)
This pt has been seeing yall

## 2012-03-23 ENCOUNTER — Other Ambulatory Visit: Payer: Self-pay | Admitting: Family Medicine

## 2012-03-28 ENCOUNTER — Other Ambulatory Visit: Payer: Self-pay | Admitting: Family Medicine

## 2012-04-03 ENCOUNTER — Other Ambulatory Visit: Payer: Self-pay | Admitting: Family Medicine

## 2012-04-14 ENCOUNTER — Other Ambulatory Visit: Payer: Self-pay | Admitting: Family Medicine

## 2012-04-17 ENCOUNTER — Other Ambulatory Visit: Payer: Self-pay | Admitting: Family Medicine

## 2012-04-21 ENCOUNTER — Other Ambulatory Visit: Payer: Medicare Other | Admitting: Lab

## 2012-04-21 ENCOUNTER — Ambulatory Visit: Payer: Medicare Other | Admitting: Physician Assistant

## 2012-04-23 ENCOUNTER — Other Ambulatory Visit: Payer: Self-pay | Admitting: *Deleted

## 2012-04-23 DIAGNOSIS — C50919 Malignant neoplasm of unspecified site of unspecified female breast: Secondary | ICD-10-CM

## 2012-04-26 ENCOUNTER — Other Ambulatory Visit (HOSPITAL_BASED_OUTPATIENT_CLINIC_OR_DEPARTMENT_OTHER): Payer: Medicare Other | Admitting: Lab

## 2012-04-26 ENCOUNTER — Ambulatory Visit (HOSPITAL_BASED_OUTPATIENT_CLINIC_OR_DEPARTMENT_OTHER): Payer: Medicare Other | Admitting: Oncology

## 2012-04-26 ENCOUNTER — Telehealth: Payer: Self-pay | Admitting: Oncology

## 2012-04-26 VITALS — BP 152/83 | HR 65 | Temp 97.9°F | Resp 20 | Ht 67.0 in | Wt 232.5 lb

## 2012-04-26 DIAGNOSIS — C50919 Malignant neoplasm of unspecified site of unspecified female breast: Secondary | ICD-10-CM

## 2012-04-26 DIAGNOSIS — Z170 Estrogen receptor positive status [ER+]: Secondary | ICD-10-CM

## 2012-04-26 LAB — COMPREHENSIVE METABOLIC PANEL
ALT: 18 U/L (ref 0–35)
AST: 20 U/L (ref 0–37)
Alkaline Phosphatase: 35 U/L — ABNORMAL LOW (ref 39–117)
CO2: 31 mEq/L (ref 19–32)
Creatinine, Ser: 0.93 mg/dL (ref 0.50–1.10)
Sodium: 140 mEq/L (ref 135–145)
Total Bilirubin: 0.9 mg/dL (ref 0.3–1.2)
Total Protein: 6.2 g/dL (ref 6.0–8.3)

## 2012-04-26 LAB — CBC WITH DIFFERENTIAL/PLATELET
BASO%: 2.7 % — ABNORMAL HIGH (ref 0.0–2.0)
EOS%: 4.9 % (ref 0.0–7.0)
LYMPH%: 49.5 % (ref 14.0–49.7)
MCH: 34.2 pg — ABNORMAL HIGH (ref 25.1–34.0)
MCHC: 34.1 g/dL (ref 31.5–36.0)
MONO#: 0.5 10*3/uL (ref 0.1–0.9)
Platelets: 178 10*3/uL (ref 145–400)
RBC: 4.11 10*6/uL (ref 3.70–5.45)
WBC: 5.1 10*3/uL (ref 3.9–10.3)

## 2012-04-26 NOTE — Telephone Encounter (Signed)
gve the pt her aug 2014 appt calendar °

## 2012-04-26 NOTE — Progress Notes (Signed)
ID: Kristin Willis   DOB: 14-Feb-1929  MR#: [id number]  ONG#:[id number]  HISTORY OF PRESENT ILLNESS: [facility] had a screening mammogram at the breast center March 10, 2007, which showed a possible mass with some calcifications in the left breast.  Diagnostic left mammogram on July 3 showed a spiculated density suspicious for invasive ductal carcinoma, which was not palpable by physical exam.  Ultrasound showed an ill-defined hypoechoic mass 2 cm from the left nipple in the 12 o'clock position measuring 1.5 cm.  This was biopsied under ultrasound guidance the same day and showed ([id number] and [id number]) an invasive ductal carcinoma, which appeared at least intermediate grade.  It was 100% ER positive, 100% PR positive, had a borderline proliferation marker at 19% and was equivocal on the HercepTest at 2+, but negative by FISH with a ratio of 1.33.  With this information the patient was referred to [facility] and on April 01, 2007 bilateral breast MRIs were obtained.  This showed only the solitary abnormality in the left breast measuring up to 1.4 cm.  The right breast and both axillae were unremarkable.  Accordingly, on April 28, 2007, [facility] proceeded to left lumpectomy with sentinel lymph node mapping.  The final pathology [id number]) found a 1.7 cm infiltrating ductal carcinoma, grade 2, with negative margins, and 0 of 1 sentinel lymph node involved.  There was no evidence of lymphovascular invasion. Her subsequent history is as detailed below.  INTERVAL HISTORY: [facility] returns today for followup of her breast cancer. The interval history generally is unremarkable. She does a lot of volunteering at church, visit sick people, and is particularly helping a neighbor who has gone blind.  REVIEW OF SYSTEMS: She has trouble getting to sleep partly because that's a pattern and partly because of pain. She does take Tylenol at bedtime and that is a bit helpful. The pain is intermittent and tends to affect a  chiefly the back but some joints also are difficult for her particularly the right knee. Of course she walks with a cane. She has some urinary dribbling, and she is undergoing a study under [facility] which seems to be helping. She had a mini stroke and [facility] is helping her. She is on Plavix right now, with good tolerance. Otherwise a detailed review of systems today was stable  PAST MEDICAL HISTORY: Past Medical History  Diagnosis Date  . Incontinence   . Wears glasses   . Arthritis     right knee, left wrist  . Diabetes mellitus     diet controlled  . Hypertension   . Asthma   . Obesity, unspecified   . Dyslipidemia   . Gout   . DJD (degenerative joint disease)   . Endometrial cancer hx  . Diabetic retinopathy   . Membranous glomerulonephropathy   . Osteopenia L hip, 4/06  . DDD (degenerative disc disease), lumbar 4/06  . Sliding hiatal hernia small sliding hiatal hernia  . Breast cancer 7/08 Inrasine ductal L breast  . Hepatitis A history(in [place]-1950's)  . Mini stroke [facility]  . Colon polyps     4/09--adenomatous. 04/2010--normal.  Repeat 2014    PAST SURGICAL HISTORY: Past Surgical History  Procedure Date  . Breast lumpectomy     left  . Abdominal hysterectomy     endometrial cancer  . Kidney stones   . Left middle finger   . Skin cancer excision   . Total knee arthroplasty     left  . Cataract extraction   .  Breast lumpectomy 8/08 L Breast ([facility])  . Colonoscopy 8/08 [facility] (normal)  . Replacement total knee Left  . Appendectomy   Significant for endometrial cancer, the patient being status post total hysterectomy with bilateral salpingo-oophorectomy in 1987.  The cancer was "level II" and she received no adjuvant therapy.  She also had surgery to her third digit in the left hand, which is not fully mobile at this point.  She had reconstructive left kidney surgery in 1964 following problems with stones.  She is status post appendectomy.  She is  status post bilateral cataract surgery.  She has a history of type 2 diabetes mellitus, which is now controlled with diet with a good hemoglobin A1c according to her report.  She has a history of membranous glomerulonephropathy followed by [facility].  She has a history of hypercholesterolemia, a history of recurrent urinary tract infections, which has resolved, a history of hypertension, a history of osteoarthritis particularly involving the knees, and a history of osteoporosis.  FAMILY HISTORY Family History  Problem Relation Age of Onset  . Heart attack Father   . Diabetes Father   . Heart attack Mother   . Hypertension Mother   . Diabetes Mother   . Hypertension Brother   . Melanoma Brother   The patient's father died from myocardial infarction in the setting of diabetes at age 88.  The patient's mother died from complications of a cholecystectomy at the age of 30.  The patient has one brother who was  diagnosed with malignant melanoma.  GYNECOLOGIC HISTORY: She is Gx, P3, first pregnancy age 6, change of life in 11 when she had her surgery.  She never had hormone replacement.  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  Her husband, [facility], died in 2023/09/19 from pulmonary fibrosis, which was felt to be secondary to reflux.  He also had prostate cancer, which was treated at this center successfully.  The patient's children are [place] who lives in [place], works as a Child psychotherapist particularly with Hispanic women, a daughter in [place] who teaches second grade, and a son in [place], [place] who is a Chiropodist and works for [employer].  The patient has five grandchildren. She is a member of the [facility].   ADVANCED DIRECTIVES: in place  HEALTH MAINTENANCE: History  Substance Use Topics  . Smoking status: Never Smoker   . Smokeless tobacco: Never Used  . Alcohol Use: No      Colonoscopy:  PAP:  Bone density:  Lipid panel:  Allergies  Allergen Reactions  . Codeine Nausea And Vomiting  . Contrast Media (Iodinated Diagnostic Agents) Nausea And Vomiting  . Morphine And Related Nausea And Vomiting  . Statins Other (See Comments)    lethargic  . Penicillins Hives, Swelling and Rash    Current Outpatient Prescriptions  Medication Sig Dispense Refill  . acetaminophen (TYLENOL) 500 MG tablet Take 1,000 mg by mouth 2 (two) times daily.        Marland Kitchen allopurinol (ZYLOPRIM) 300 MG tablet TAKE 1/2 TABLET DAILY  90 tablet  1  . amLODipine (NORVASC) 5 MG tablet TAKE 1 TABLET BY MOUTH EVERY DAY  30 tablet  2  . B Complex-C (SUPER B COMPLEX PO) Take 1 capsule by mouth daily.        . calcium citrate-vitamin D (CITRACAL+D) 315-200 MG-UNIT per tablet Take 2 tablets by mouth daily.        Marland Kitchen  carvedilol (COREG) 25 MG tablet TAKE 1 & 1/2 TABLETS BY MOUTH TWICE A DAY  90 tablet  1  . Cholecalciferol (VITAMIN D) 2000 UNITS tablet Take 2,000 Units by mouth daily.      . clindamycin (CLEOCIN) 150 MG capsule Take 600 mg by mouth See admin instructions. 1 hour prior to teeth cleaning       . clopidogrel (PLAVIX) 75 MG tablet Take 75 mg by mouth daily.        . Coenzyme Q10 (CO Q 10 PO) Take 2 tablets by mouth daily.      . Glucosamine-Chondroit-Vit C-Mn (GLUCOSAMINE CHONDR 500 COMPLEX PO) Take 2 tablets by mouth 2 (two) times daily.        . hydrochlorothiazide (,MICROZIDE/HYDRODIURIL,) 12.5 MG capsule Take 12.5 mg by mouth daily.        Marland Kitchen ibandronate (BONIVA) 150 MG tablet Take 1 tablet (150 mg total) by mouth every 30 (thirty) days. Take in the morning with a full glass of water, on an empty stomach, and do not take anything else by mouth or lie down for the next 30 min.  1 tablet  11  . KLOR-CON 10 10 MEQ tablet TAKE 1 TABLET BY MOUTH EVERY DAY  30 tablet  4  . Krill Oil 500 MG CAPS Take 1 capsule by mouth daily.      Marland Kitchen letrozole (FEMARA) 2.5 MG tablet TAKE 1 TABLET BY MOUTH  EVERY DAY  30 tablet  4  . lisinopril (PRINIVIL,ZESTRIL) 20 MG tablet Take 20 mg by mouth daily.      Marland Kitchen losartan (COZAAR) 100 MG tablet TAKE 1 TABLET BY MOUTH TWICE A DAY  60 tablet  5  . solifenacin (VESICARE) 5 MG tablet Take 10 mg by mouth daily.        Marland Kitchen ZETIA 10 MG tablet TAKE 1 TABLET BY MOUTH EVERY DAY  30 tablet  5  . Evening Primrose Oil CAPS Take 1 capsule by mouth daily.        Marland Kitchen DISCONTD: amLODipine (NORVASC) 5 MG tablet TAKE 1 TABLET BY MOUTH EVERY DAY  30 tablet  2    OBJECTIVE: Elderly white woman in no acute distress Filed Vitals:   04/26/12 1116  BP: 152/83  Pulse: 65  Temp: 97.9 F (36.6 C)  Resp: 20     Body mass index is 36.41 kg/(m^2).    ECOG FS: 2  Sclerae unicteric Oropharynx clear No cervical or supraclavicular adenopathy Lungs no rales or rhonchi Heart regular rate and rhythm Abd obese, benign MSK moderate scoliosis, no focal spinal tenderness Neuro: nonfocal Breasts: The right breast is unremarkable. The left breast is status post lumpectomy. The nipple is chronically inverted. There is no evidence of local recurrence.  LAB RESULTS: Lab Results  Component Value Date   WBC 5.1 04/26/2012   NEUTROABS 1.8 04/26/2012   HGB 14.1 04/26/2012   HCT 41.2 04/26/2012   MCV 100.2 04/26/2012   PLT 178 04/26/2012      Chemistry      Component Value Date/Time   NA 136 01/29/2012 1041   K 3.8 01/29/2012 1041   CL 101 01/29/2012 1041   CO2 29 01/29/2012 1041   BUN 28* 01/29/2012 1041   CREATININE 0.83 01/29/2012 1041   CREATININE 0.90 12/17/2011 1144      Component Value Date/Time   CALCIUM 9.2 01/29/2012 1041   ALKPHOS 39 01/29/2012 1041   AST 18 01/29/2012 1041   ALT 14 01/29/2012 1041  BILITOT 0.7 01/29/2012 1041       Lab Results  Component Value Date   LABCA2 21 01/29/2012    No components found with this basename: LABCA125    No results found for this basename: INR:1;PROTIME:1 in the last 168 hours  Urinalysis No results found for this basename:  colorurine, appearanceur, labspec, phurine, glucoseu, hgbur, bilirubinur, ketonesur, proteinur, urobilinogen, nitrite, leukocytesur    STUDIES: No results found.  ASSESSMENT: 78 y.o. [place] woman status post left lumpectomy and sentinel lymph node biopsy August of 2008 for a T1 N0 (Stage I) invasive ductal carcinoma, grade 2, strongly estrogen receptor and progesterone receptor positive, HER-2 negative, with a borderline MIB-1. She opted to forego radiation and started tamoxifen September of 2008. After a TIA January 2012 she was switched to Femara on which she continues with good tolerance.   PLAN: The plan is to continue letrozole to a total of 5 years. We are making no changes there, as far as her breast cancer is concerned.  I wonder if she would benefit from gabapentin at bedtime. This may make her a little sleepy and also take some of the pain off that keeps her up at night. She understands this is not a narcotic, not Motrin or aspirin-like, and that I would not recommend she take it during the day because it may make her sleepy. She'll so is spending $100 a month on Boniva. I wonder she could switch to week last. She will discuss that with her primary care physician at their next visit. Otherwise Delcenia will return to see me in one year. She knows to call for any problems that may develop before that.   Deisi Salonga C    04/26/2012

## 2012-05-03 ENCOUNTER — Other Ambulatory Visit: Payer: Self-pay | Admitting: Obstetrics & Gynecology

## 2012-05-03 DIAGNOSIS — Z853 Personal history of malignant neoplasm of breast: Secondary | ICD-10-CM

## 2012-05-05 ENCOUNTER — Other Ambulatory Visit: Payer: Medicare Other | Admitting: Lab

## 2012-05-11 ENCOUNTER — Ambulatory Visit
Admission: RE | Admit: 2012-05-11 | Discharge: 2012-05-11 | Disposition: A | Discharge status: Home / Self Care | Payer: Medicare Other | Source: Ambulatory Visit | Attending: Obstetrics & Gynecology | Admitting: Obstetrics & Gynecology

## 2012-05-11 DIAGNOSIS — Z853 Personal history of malignant neoplasm of breast: Secondary | ICD-10-CM

## 2012-05-15 ENCOUNTER — Other Ambulatory Visit: Payer: Self-pay | Admitting: Oncology

## 2012-05-15 DIAGNOSIS — C50919 Malignant neoplasm of unspecified site of unspecified female breast: Secondary | ICD-10-CM

## 2012-05-31 ENCOUNTER — Other Ambulatory Visit: Payer: Self-pay | Admitting: Family Medicine

## 2012-06-07 ENCOUNTER — Telehealth: Payer: Self-pay | Admitting: Medical

## 2012-06-07 DIAGNOSIS — M8580 Other specified disorders of bone density and structure, unspecified site: Secondary | ICD-10-CM

## 2012-06-07 MED ORDER — IBANDRONATE SODIUM 150 MG PO TABS: 150.0000 mg | ORAL_TABLET | ORAL | Status: DC

## 2012-06-07 NOTE — Telephone Encounter (Signed)
Done

## 2012-06-07 NOTE — Telephone Encounter (Signed)
Okay to refill x1 year 

## 2012-06-09 ENCOUNTER — Other Ambulatory Visit: Payer: Self-pay | Admitting: *Deleted

## 2012-06-09 DIAGNOSIS — M8580 Other specified disorders of bone density and structure, unspecified site: Secondary | ICD-10-CM

## 2012-06-09 MED ORDER — IBANDRONATE SODIUM 150 MG PO TABS: 150.0000 mg | ORAL_TABLET | ORAL | Status: DC

## 2012-06-16 ENCOUNTER — Encounter: Payer: Self-pay | Admitting: Internal Medicine

## 2012-06-18 ENCOUNTER — Other Ambulatory Visit: Payer: Self-pay | Admitting: Family Medicine

## 2012-06-21 ENCOUNTER — Ambulatory Visit (INDEPENDENT_AMBULATORY_CARE_PROVIDER_SITE_OTHER): Payer: Medicare Other | Admitting: Family Medicine

## 2012-06-21 ENCOUNTER — Telehealth: Payer: Self-pay | Admitting: *Deleted

## 2012-06-21 ENCOUNTER — Encounter: Payer: Self-pay | Admitting: Family Medicine

## 2012-06-21 VITALS — BP 122/72 | HR 72 | Ht 67.0 in | Wt 231.0 lb

## 2012-06-21 DIAGNOSIS — M810 Age-related osteoporosis without current pathological fracture: Secondary | ICD-10-CM | POA: Insufficient documentation

## 2012-06-21 DIAGNOSIS — M899 Disorder of bone, unspecified: Secondary | ICD-10-CM

## 2012-06-21 DIAGNOSIS — I10 Essential (primary) hypertension: Secondary | ICD-10-CM

## 2012-06-21 DIAGNOSIS — C50919 Malignant neoplasm of unspecified site of unspecified female breast: Secondary | ICD-10-CM

## 2012-06-21 DIAGNOSIS — M8580 Other specified disorders of bone density and structure, unspecified site: Secondary | ICD-10-CM

## 2012-06-21 DIAGNOSIS — Z79899 Other long term (current) drug therapy: Secondary | ICD-10-CM

## 2012-06-21 DIAGNOSIS — E7800 Pure hypercholesterolemia, unspecified: Secondary | ICD-10-CM

## 2012-06-21 DIAGNOSIS — E119 Type 2 diabetes mellitus without complications: Secondary | ICD-10-CM

## 2012-06-21 LAB — POCT GLYCOSYLATED HEMOGLOBIN (HGB A1C): Hemoglobin A1C: 6.7

## 2012-06-21 NOTE — Telephone Encounter (Signed)
I received that form for the Reclast infusion and have placed it on your shelf if you can complete. We do need to add serum creatinine and calcium as it needs to be less than 18 days old. What diagnosis codes should I use? Her last CMET was 04/26/12.

## 2012-06-21 NOTE — Patient Instructions (Addendum)
Start using exercise bike at least 30 minutes daily. Weight loss would help your sugars.  Cut out carbs/white foods in your diet (substitute with whole grain, use sweet potatoes instead of regular potatoes) and cut back on your portion sizes.  Periodically check your blood sugars.  Return in 6 months--if sugars are higher, may need medications.

## 2012-06-21 NOTE — Progress Notes (Signed)
Chief Complaint  Patient presents with  . Diabetes    6 month follow up, fasting. Dr.Magrinat would like to know if Reclast instead of Boniva would be a possibility for patient.   Saw Dr. Eliott Nine last week, and there was no protein in her urine, BP was "like a teenager", and hasn't been having swelling.  Everything "was the best in 10 years".  Saw Dr. Darnelle Catalan, and had a good report. He suggested possibly using Reclast in place of Boniva.  She states this is due to cost, not due to any side effects or tolerability issues. Sandrea Hammond is costing her $100/month.  She denies chest pain, dysphagia, or other side effects of bisphosphonate.  Saw Dr. Jennette Kettle for full GYN exam. Saw Dr. Terri Piedra recently, and some precancerous lesions were frozen. She continues to see urologist for procedure with pins in feet for urinary frequency..  Hyperlipidemia:  She started taking krill oil 500 mg twice daily 6 months ago, and is due to recheck lipids today.  She didn't tolerate regular fish oil ("too fishy") but is tolerating the krill oil.  She continues on the Zetia, and tries to follow low cholesterol diet.  HTN:  BP's are checked at home, but she didn't bring record and can't remember numbers.  She brought the numbers to Dr. Eliott Nine, who was pleased with her blood pressure.  She denies headaches, dizziness, chest pain.  Not having significant swelling in legs.  Gout--no flare in years.  Last uric acid was 4.0 6 months ago.  Had CBC and chem panel in August (through oncology).  Glu 157 (nonfasting), otherwise okay.  Sees Dr. Anne Hahn once a year.  Denies any bleeding from Plavix.  DM--has glucometer, but doesn't really check her sugars regularly.  Exercises twice a week (water aerobics).  Past Medical History  Diagnosis Date  . Incontinence   . Wears glasses   . Arthritis     right knee, left wrist  . Diabetes mellitus     diet controlled  . Hypertension   . Asthma   . Obesity, unspecified   . Dyslipidemia   .  Gout   . DJD (degenerative joint disease)   . Endometrial cancer hx  . Diabetic retinopathy(362.0)   . Membranous glomerulonephropathy   . Osteopenia L hip, 4/06  . DDD (degenerative disc disease), lumbar 4/06  . Sliding hiatal hernia small sliding hiatal hernia  . Breast cancer 7/08 Inrasine ductal L breast  . Hepatitis A history(in [place]-1950's)  . Mini stroke [facility]  . Colon polyps     4/09--adenomatous. 04/2010--normal.  Repeat 2014   Past Surgical History  Procedure Date  . Breast lumpectomy     left  . Abdominal hysterectomy     endometrial cancer  . Kidney stones   . Left middle finger   . Skin cancer excision   . Total knee arthroplasty     left  . Cataract extraction   . Breast lumpectomy 8/08 L Breast ([name])  . Colonoscopy 8/08 [name] (normal)  . Replacement total knee Left  . Appendectomy    History   Social History  . Marital Status: Married    Spouse Name: N/A    Number of Children: N/A  . Years of Education: N/A   Occupational History  . Not on file.   Social History Main Topics  . Smoking status: Never Smoker   . Smokeless tobacco: Never Used  . Alcohol Use: No  . Drug Use: No  . Sexually Active:  Not on file   Other Topics Concern  . Not on file   Social History Narrative  . No narrative on file   Current Outpatient Prescriptions on File Prior to Visit  Medication Sig Dispense Refill  . acetaminophen (TYLENOL) 500 MG tablet Take 1,000 mg by mouth 2 (two) times daily.        Marland Kitchen allopurinol (ZYLOPRIM) 300 MG tablet TAKE 1/2 TABLET DAILY  90 tablet  1  . amLODipine (NORVASC) 5 MG tablet TAKE 1 TABLET BY MOUTH EVERY DAY  30 tablet  2  . B Complex-C (SUPER B COMPLEX PO) Take 1 capsule by mouth daily.        . Calcium Carbonate-Vitamin D (CALTRATE 600+D PO) Take 2 tablets by mouth daily.      . carvedilol (COREG) 25 MG tablet TAKE 1 & 1/2 TABLETS BY MOUTH TWICE A DAY  45 tablet  1  . Cholecalciferol (VITAMIN D) 2000 UNITS tablet Take  2,000 Units by mouth daily.      . clindamycin (CLEOCIN) 150 MG capsule Take 600 mg by mouth See admin instructions. 1 hour prior to teeth cleaning       . clopidogrel (PLAVIX) 75 MG tablet Take 75 mg by mouth daily.        . Coenzyme Q10 (CO Q 10 PO) Take 2 tablets by mouth daily.      . Evening Primrose Oil CAPS Take 1 capsule by mouth daily.        Marland Kitchen gabapentin (NEURONTIN) 300 MG capsule Take 300 mg by mouth daily.      . hydrochlorothiazide (,MICROZIDE/HYDRODIURIL,) 12.5 MG capsule Take 12.5 mg by mouth daily.        Marland Kitchen KLOR-CON 10 10 MEQ tablet TAKE 1 TABLET BY MOUTH EVERY DAY  30 tablet  4  . Krill Oil 500 MG CAPS Take 1 capsule by mouth daily.      Marland Kitchen letrozole (FEMARA) 2.5 MG tablet TAKE 1 TABLET BY MOUTH EVERY DAY  30 tablet  PRN  . lisinopril (PRINIVIL,ZESTRIL) 20 MG tablet Take 20 mg by mouth daily.      Marland Kitchen losartan (COZAAR) 100 MG tablet TAKE 1 TABLET BY MOUTH TWICE A DAY  60 tablet  5  . solifenacin (VESICARE) 5 MG tablet Take 10 mg by mouth daily.        Marland Kitchen ZETIA 10 MG tablet TAKE 1 TABLET BY MOUTH EVERY DAY  30 tablet  5  . ibandronate (BONIVA) 150 MG tablet Take 1 tablet (150 mg total) by mouth every 30 (thirty) days. Take in the morning with a full glass of water, on an empty stomach, and do not take anything else by mouth or lie down for the next 30 min.  1 tablet  11   Allergies  Allergen Reactions  . Codeine Nausea And Vomiting  . Contrast Media (Iodinated Diagnostic Agents) Nausea And Vomiting  . Morphine And Related Nausea And Vomiting  . Statins Other (See Comments)    lethargic  . Penicillins Hives, Swelling and Rash   ROS: Denies fevers, URI symptoms, just some PND at night with thick phlegm only in the mornings.  claritin hasn't helped.  Urinary leakage--improved with meds/treatments, up once in the middle of the night.  Denies dysuria, denies diarrhea, nausea, vomiting, dysphagia, chest pain, shortness of breath. One night had some chest pain, relieved by a heartburn  medication.  See HPI  PHYSICAL EXAM: BP 122/72  Pulse 72  Ht 5\' 7"  (1.702 m)  Wt 231 lb (104.781 kg)  BMI 36.18 kg/m2 Pleasant, elderly obese female in no distress Neck: No lymphadenopathy or thyromegaly, no carotid bruit  Heart: Regular rate and rhythm, no murmurs, rubs, gallops or ectopy  Lungs: Clear bilaterally, without wheezes, rales or ronchi  Extremities: No clubbing, or cyanosis, 2+ pulses. No edema. Neuro: Alert and oriented x 3, cranial nerves grossly intact.  Back: No spine or CVA tenderness  Skin: no rashes or suspicious lesions  Psych: Normal mood, affect, hygiene and grooming, normal speech, eye contact    Lab Results  Component Value Date   HGBA1C 6.7 06/21/2012      Chemistry      Component Value Date/Time   NA 140 04/26/2012 1045   K 4.1 04/26/2012 1045   CL 101 04/26/2012 1045   CO2 31 04/26/2012 1045   BUN 28* 04/26/2012 1045   CREATININE 0.93 04/26/2012 1045   CREATININE 0.90 12/17/2011 1144      Component Value Date/Time   CALCIUM 10.3 04/26/2012 1045   ALKPHOS 35* 04/26/2012 1045   AST 20 04/26/2012 1045   ALT 18 04/26/2012 1045   BILITOT 0.9 04/26/2012 1045     Glucose 157  Lab Results  Component Value Date   WBC 5.1 04/26/2012   HGB 14.1 04/26/2012   HCT 41.2 04/26/2012   MCV 100.2 04/26/2012   PLT 178 04/26/2012   ASSESSMENT/PLAN: 1. Type II or unspecified type diabetes mellitus without mention of complication, not stated as uncontrolled  HgB A1c, Glucose, random  2. Pure hypercholesterolemia  Lipid panel  3. Essential hypertension, benign    4. Encounter for long-term (current) use of other medications  Calcium, Creatinine, serum  5. Osteopenia    6. Breast cancer     DM--A1c up slightly to 6.7, not currently on meds.  Discussed trying to avoid meds by getting daily exercise, keeping sugars down with proper diet.  If A1c gets closer to 7, will need to start meds, but will work on diet/behavioral techniques for now. Start using exercise bike at  least 30 minutes daily. Continue water aerobics. Weight loss would help your sugars.  Cut out carbs/white foods in your diet (substitute with whole grain, use sweet potatoes instead of regular potatoes) and cut back on your portion sizes.  RECLAST--look into cost and referral process.  Last DEXA was 02/2011, T 11.1, only low bone mass.  Will check with Dr. Darnelle Catalan whether she truly needs to continue meds at all, and if so, pursue Reclast infusion (vs changing to generic oral bisphosphonate).

## 2012-06-22 LAB — LIPID PANEL
HDL: 34 mg/dL — ABNORMAL LOW (ref >39)
Triglycerides: 317 mg/dL — ABNORMAL HIGH (ref <150)

## 2012-06-25 ENCOUNTER — Telehealth: Payer: Self-pay | Admitting: Internal Medicine

## 2012-06-25 NOTE — Telephone Encounter (Signed)
Noted  

## 2012-06-25 NOTE — Telephone Encounter (Signed)
FYI--Pt states she did some research and she does not want to go with reclast cause she can get the generic of boniva for $12.00 and that is good enough for her

## 2012-06-28 ENCOUNTER — Other Ambulatory Visit: Payer: Self-pay | Admitting: Family Medicine

## 2012-06-29 ENCOUNTER — Other Ambulatory Visit: Payer: Self-pay | Admitting: Oncology

## 2012-06-29 ENCOUNTER — Telehealth: Payer: Self-pay | Admitting: *Deleted

## 2012-06-29 NOTE — Telephone Encounter (Signed)
Per staff message and POF I have scheduled appt.  JMW  

## 2012-06-29 NOTE — Telephone Encounter (Signed)
PATIENT CONFIRMED APPOINTMENT FOR 07-08-2012 STARTING AT 11:15AM FOLLOWED BY ZOMETA  SENT MICHELLE EMAIL TO SET UP PATIENT FOR ZOMETA ON 07-08-2012

## 2012-07-06 ENCOUNTER — Telehealth: Payer: Self-pay | Admitting: *Deleted

## 2012-07-06 NOTE — Telephone Encounter (Signed)
Per patient request I have move appts from 10/24 to 10/25.  JMW

## 2012-07-08 ENCOUNTER — Other Ambulatory Visit: Payer: Medicare Other | Admitting: Lab

## 2012-07-08 ENCOUNTER — Other Ambulatory Visit: Payer: Self-pay | Admitting: *Deleted

## 2012-07-08 ENCOUNTER — Telehealth: Payer: Self-pay | Admitting: *Deleted

## 2012-07-08 ENCOUNTER — Ambulatory Visit: Payer: Medicare Other

## 2012-07-08 NOTE — Telephone Encounter (Signed)
This RN spoke with pt per noted appointment tomorrow for zometa for osteopenia. Appointment was made at time of MD appointment due to cost of Boniva.  Pt went to see primary MD and discussed above and was switched to an oral biophosphate. Pt will not come in for appointment. Appointment canceled by this RN.

## 2012-07-09 ENCOUNTER — Other Ambulatory Visit: Payer: Medicare Other | Admitting: Lab

## 2012-07-09 ENCOUNTER — Ambulatory Visit: Payer: Medicare Other

## 2012-07-09 ENCOUNTER — Other Ambulatory Visit: Payer: Self-pay | Admitting: *Deleted

## 2012-08-25 ENCOUNTER — Other Ambulatory Visit: Payer: Self-pay | Admitting: Family Medicine

## 2012-09-12 ENCOUNTER — Other Ambulatory Visit: Payer: Self-pay | Admitting: Family Medicine

## 2012-09-21 ENCOUNTER — Other Ambulatory Visit: Payer: Self-pay | Admitting: Family Medicine

## 2012-10-01 ENCOUNTER — Other Ambulatory Visit: Payer: Self-pay | Admitting: Family Medicine

## 2012-10-07 ENCOUNTER — Other Ambulatory Visit: Payer: Self-pay | Admitting: Family Medicine

## 2012-10-28 ENCOUNTER — Other Ambulatory Visit: Payer: Self-pay | Admitting: Family Medicine

## 2012-11-26 ENCOUNTER — Other Ambulatory Visit: Payer: Self-pay | Admitting: Family Medicine

## 2012-11-27 ENCOUNTER — Other Ambulatory Visit: Payer: Self-pay | Admitting: Family Medicine

## 2012-12-07 ENCOUNTER — Telehealth: Payer: Self-pay | Admitting: *Deleted

## 2012-12-07 NOTE — Telephone Encounter (Signed)
Lm advising the pt about her schedule change.gv appt d/t also made her aware that i will mail letter/cal as a reminder.

## 2012-12-22 ENCOUNTER — Other Ambulatory Visit: Payer: Self-pay | Admitting: Family Medicine

## 2012-12-22 ENCOUNTER — Ambulatory Visit: Payer: Medicare Other | Admitting: Family Medicine

## 2012-12-28 ENCOUNTER — Telehealth: Payer: Self-pay | Admitting: Family Medicine

## 2012-12-28 NOTE — Telephone Encounter (Signed)
Phoned pt and advised she missed her last appt with Dr. Lynelle Doctor and she said 'NO I DID NOT".  I explained it was an appt she had made when she left from her last visit.  Pt apologized and then rescheduled for 01/10/13.

## 2013-01-01 ENCOUNTER — Other Ambulatory Visit: Payer: Self-pay | Admitting: Neurology

## 2013-01-08 ENCOUNTER — Other Ambulatory Visit: Payer: Self-pay | Admitting: Family Medicine

## 2013-01-10 ENCOUNTER — Telehealth: Payer: Self-pay | Admitting: Internal Medicine

## 2013-01-10 ENCOUNTER — Ambulatory Visit
Admission: RE | Admit: 2013-01-10 | Discharge: 2013-01-10 | Disposition: A | Discharge status: Home / Self Care | Payer: BC Managed Care – PPO | Source: Ambulatory Visit | Attending: Family Medicine | Admitting: Family Medicine

## 2013-01-10 ENCOUNTER — Ambulatory Visit (INDEPENDENT_AMBULATORY_CARE_PROVIDER_SITE_OTHER): Payer: Medicare Other | Admitting: Family Medicine

## 2013-01-10 ENCOUNTER — Encounter: Payer: Self-pay | Admitting: Family Medicine

## 2013-01-10 VITALS — BP 130/76 | HR 68 | Ht 67.0 in | Wt 206.0 lb

## 2013-01-10 DIAGNOSIS — IMO0001 Reserved for inherently not codable concepts without codable children: Secondary | ICD-10-CM

## 2013-01-10 DIAGNOSIS — R0989 Other specified symptoms and signs involving the circulatory and respiratory systems: Secondary | ICD-10-CM

## 2013-01-10 DIAGNOSIS — I10 Essential (primary) hypertension: Secondary | ICD-10-CM

## 2013-01-10 DIAGNOSIS — G609 Hereditary and idiopathic neuropathy, unspecified: Secondary | ICD-10-CM

## 2013-01-10 DIAGNOSIS — M25562 Pain in left knee: Secondary | ICD-10-CM

## 2013-01-10 DIAGNOSIS — E7800 Pure hypercholesterolemia, unspecified: Secondary | ICD-10-CM

## 2013-01-10 DIAGNOSIS — G629 Polyneuropathy, unspecified: Secondary | ICD-10-CM

## 2013-01-10 DIAGNOSIS — E119 Type 2 diabetes mellitus without complications: Secondary | ICD-10-CM

## 2013-01-10 DIAGNOSIS — M25569 Pain in unspecified knee: Secondary | ICD-10-CM

## 2013-01-10 DIAGNOSIS — M109 Gout, unspecified: Secondary | ICD-10-CM

## 2013-01-10 DIAGNOSIS — Z79899 Other long term (current) drug therapy: Secondary | ICD-10-CM

## 2013-01-10 LAB — CBC WITH DIFFERENTIAL/PLATELET
Basophils Absolute: 0.1 10*3/uL (ref 0.0–0.1)
Eosinophils Relative: 3 % (ref 0–5)
HCT: 41.7 % (ref 36.0–46.0)
Lymphocytes Relative: 38 % (ref 12–46)
MCH: 32.6 pg (ref 26.0–34.0)
MCV: 98.6 fL (ref 78.0–100.0)
Monocytes Absolute: 0.6 10*3/uL (ref 0.1–1.0)
RDW: 13.7 % (ref 11.5–15.5)
WBC: 6.9 10*3/uL (ref 4.0–10.5)

## 2013-01-10 LAB — COMPREHENSIVE METABOLIC PANEL
ALT: 12 U/L (ref 0–35)
AST: 13 U/L (ref 0–37)
Alkaline Phosphatase: 32 U/L — ABNORMAL LOW (ref 39–117)
Glucose, Bld: 109 mg/dL — ABNORMAL HIGH (ref 70–99)
Sodium: 142 mEq/L (ref 135–145)
Total Bilirubin: 0.9 mg/dL (ref 0.3–1.2)
Total Protein: 6.3 g/dL (ref 6.0–8.3)

## 2013-01-10 LAB — LIPID PANEL
HDL: 37 mg/dL — ABNORMAL LOW (ref >39)
LDL Cholesterol: 68 mg/dL (ref 0–99)
Total CHOL/HDL Ratio: 3.6 Ratio
VLDL: 27 mg/dL (ref 0–40)

## 2013-01-10 MED ORDER — NEEDLES & SYRINGES MISC: 1.0000 | Freq: Once | Status: DC

## 2013-01-10 MED ORDER — GLUCOSE BLOOD VI STRP: ORAL_STRIP | Status: DC

## 2013-01-10 NOTE — Progress Notes (Signed)
Chief Complaint  Patient presents with  . 6 month follow-up    6 month follow-up- fasting   Patient reports that Dr. 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  She has been cutting down on her eating, cut out white foods and fast food, and has lost 25 pounds since her last visit.  Diabetes follow-up:  Blood sugars at home are running 120-140 (best she can recall, she didn't bring her list).  Denies hypoglycemia.  Denies polydipsia and polyuria.  Last eye exam was August 2013.  Patient follows a low sugar diet.  She can't see her feet to check them, but denies any pain or problems.  Hyperlipidemia follow-up:  Patient is reportedly following a low-fat, low cholesterol diet.  Compliant with medications and denies medication side effects.  Didn't tolerate statins in the past.  Previously had low HDL and high TG, but diet has changed significantly since last check.  She does water aerobics and walks some.  Hypertension follow-up:  Blood pressures elsewhere are 117-120/70's.  Denies dizziness, headaches, chest pain.  Denies side effects of medications.  Gout:  No flare in over 5 years.  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  She is s/p TKR and desires x-ray.  She is no longer under the care of an orthopedist.  Denies falls or giving way, no trauma.  She brings in a paper she received from [facility] asking to have lower extremity doppler studies with AVI's due to diminished pulses bilaterally  Past Medical History  Diagnosis Date  . Incontinence   . Wears glasses   . Arthritis     right knee, left wrist  . Diabetes mellitus     diet controlled  . Hypertension   . Asthma     related to allergies when working; resolved  . Obesity, unspecified   . Dyslipidemia   . Gout   . DJD (degenerative joint disease)   . Endometrial cancer hx  . Diabetic retinopathy(362.0)   . Membranous glomerulonephropathy   .  Osteopenia L hip, 4/06  . DDD (degenerative disc disease), lumbar 4/06  . Sliding hiatal hernia small sliding hiatal hernia  . Breast cancer 7/08 Inrasine ductal L breast  . Hepatitis A history(in [place]-1950's)  . Mini stroke DrWillis  . Colon polyps     4/09--adenomatous. 04/2010--normal.  Repeat 2014   Past Surgical History  Procedure Laterality Date  . Breast lumpectomy      left  . Abdominal hysterectomy      endometrial cancer  . Kidney stones    . Left middle finger    . Skin cancer excision    . Total knee arthroplasty      left  . Cataract extraction    . Breast lumpectomy  8/08 L Breast ([name])  . Colonoscopy  8/08 [name] (normal)  . Replacement total knee  Left  . Appendectomy     History   Social History  . Marital Status: Married    Spouse Name: N/A    Number of Children: N/A  . Years of Education: N/A   Occupational History  . Not on file.   Social History Main Topics  . Smoking status: Never Smoker   . Smokeless tobacco: Never Used  . Alcohol Use: No  . Drug Use: No  . Sexually Active: Not on file   Other Topics Concern  . Not on file  Social History Narrative  . No narrative on file   Current Outpatient Prescriptions on File Prior to Visit  Medication Sig Dispense Refill  . acetaminophen (TYLENOL) 500 MG tablet Take 1,000 mg by mouth 2 (two) times daily.        Marland Kitchen allopurinol (ZYLOPRIM) 300 MG tablet TAKE 1/2 TABLET DAILY  90 tablet  1  . amLODipine (NORVASC) 5 MG tablet TAKE 1 TABLET BY MOUTH EVERY DAY  30 tablet  1  . B Complex-C (SUPER B COMPLEX PO) Take 1 capsule by mouth daily.        . Calcium Carbonate-Vitamin D (CALTRATE 600+D PO) Take 2 tablets by mouth daily.      . carvedilol (COREG) 25 MG tablet TAKE 1 & 1/2 TABLETS BY MOUTH TWICE A DAY  45 tablet  0  . Cholecalciferol (VITAMIN D) 2000 UNITS tablet Take 2,000 Units by mouth daily.      . clindamycin (CLEOCIN) 150 MG capsule Take 600 mg by mouth See admin instructions. 1  hour prior to teeth cleaning       . clopidogrel (PLAVIX) 75 MG tablet TAKE 1 TABLET BY MOUTH EVERY DAY  90 tablet  0  . Coenzyme Q10 (CO Q 10 PO) Take 2 tablets by mouth daily.      . Evening Primrose Oil CAPS Take 1 capsule by mouth daily.        Marland Kitchen gabapentin (NEURONTIN) 300 MG capsule Take 300 mg by mouth daily.      . hydrochlorothiazide (,MICROZIDE/HYDRODIURIL,) 12.5 MG capsule Take 12.5 mg by mouth daily.        Marland Kitchen ibandronate (BONIVA) 150 MG tablet Take 1 tablet (150 mg total) by mouth every 30 (thirty) days. Take in the morning with a full glass of water, on an empty stomach, and do not take anything else by mouth or lie down for the next 30 min.  1 tablet  11  . KLOR-CON 10 10 MEQ tablet TAKE 1 TABLET BY MOUTH EVERY DAY  30 tablet  4  . Krill Oil 500 MG CAPS Take 1 capsule by mouth daily.      Marland Kitchen letrozole (FEMARA) 2.5 MG tablet TAKE 1 TABLET BY MOUTH EVERY DAY  30 tablet  PRN  . lisinopril (PRINIVIL,ZESTRIL) 20 MG tablet TAKE 1 TABLET BY MOUTH EVERY DAY  60 tablet  3  . losartan (COZAAR) 100 MG tablet TAKE 1 TABLET BY MOUTH TWICE A DAY  60 tablet  5  . Misc Natural Products (OSTEO BI-FLEX ADV TRIPLE ST PO) Take 2 capsules by mouth daily.      . solifenacin (VESICARE) 5 MG tablet Take 10 mg by mouth daily.        Marland Kitchen ZETIA 10 MG tablet TAKE 1 TABLET BY MOUTH EVERY DAY  30 tablet  5   No current facility-administered medications on file prior to visit.   Allergies  Allergen Reactions  . Codeine Nausea And Vomiting  . Contrast Media (Iodinated Diagnostic Agents) Nausea And Vomiting  . Morphine And Related Nausea And Vomiting  . Statins Other (See Comments)    lethargic  . Toprol Xl (Metoprolol Tartrate)     Symptoms of heart attack, per pt, told not to take it again (?chest pain)  . Penicillins Hives, Swelling and Rash   ROS: +weight loss as per HPI.  Denies cough, shortness of breath, chest pain, GI complaints, skin rashes, lesions. R hip pain in am's/sciatica, better after in  shower.  +PND in the  mornings.  +urinary frequency, no dysuria or hematuria (chronic, under care of urologist).. L knee pain per HPI.  Denies depression  PHYSICAL EXAM: BP 130/76  Pulse 68  Ht 5\' 7"  (1.702 m)  Wt 206 lb (93.441 kg)  BMI 32.26 kg/m2 Pleasant elderly female, who appears somewhat younger than stated age HEENT:  PERRL conjunctiva clear Neck: no lymphadenopathy, thyromegaly or carotid bruit Heart: regular rate and rhythm Lungs: clear bilaterally Back: no spine or CVA tenderness Abdomen: soft, nontender, no mass Extremities: Palpable DP pulses bilaterally, but faint, more easily felt on R than left.  Diminished PT bilaterally Decreased sensation in toes on L.  Normal sensation elsewhere in L foot, normal on right. L knee--WHSS.  Slightly warm, but no significant effusion.  No joint line tenderness, crepitus or other abnormalities noted. Psych: normal mood, affect, hygiene and grooming Neuro: alert and oriented.  Normal gait, cranial nerves grossly intact  Lab Results  Component Value Date   HGBA1C 5.7% 01/10/2013    ASSESSMENT/PLAN: Type II or unspecified type diabetes mellitus without mention of complication, not stated as uncontrolled - well controlled, improved significantly with weight loss and dietary changes - Plan: HgB A1c, Comprehensive metabolic panel, TSH, Ankle brachial index  Type II or unspecified type diabetes mellitus without mention of complication, uncontrolled  Pure hypercholesterolemia - Plan: Lipid panel, Comprehensive metabolic panel  Essential hypertension, benign - controlled - Plan: Ankle brachial index  Gout - controlled--no flares in years on allopurinol - Plan: Uric Acid  Encounter for long-term (current) use of other medications - Plan: Comprehensive metabolic panel, CBC with Differential, Uric Acid  Left knee pain - check x-ray; s/p TKR - Plan: DG Knee Complete 4 Views Left  Diminished pulses in lower extremity - refer for ABI - Plan:  Ankle brachial index  Diabetic with some neuropathy and decreased sensation--refer for ABI and to podiatrist for routine exam.  X-ray L knee.  F/u with ortho if abnormal or ongoing pain.  F/u 6 months for fasting med check, sooner prn

## 2013-01-10 NOTE — Patient Instructions (Addendum)
Continue current medication. We will be in touch with your blood test results.  We will be referring you for ABI test, and to podiatrist. Get x-ray of your left knee at [facility] at your convenience

## 2013-01-10 NOTE — Telephone Encounter (Signed)
i have faxed over info to [facility] for decreased sensation in toes phone # [phone] and to [facility] for ABI to be done phone # [phone]. They should call her to set up appt.

## 2013-01-10 NOTE — Telephone Encounter (Signed)
Pt uses freestyle test strips and lancets

## 2013-01-11 ENCOUNTER — Telehealth: Payer: Self-pay | Admitting: Family Medicine

## 2013-01-11 MED ORDER — GLUCOSE BLOOD VI STRP: ORAL_STRIP | Status: DC

## 2013-01-11 MED ORDER — NEEDLES & SYRINGES MISC: 1.0000 | Freq: Once | Status: DC

## 2013-01-11 NOTE — Telephone Encounter (Signed)
i put in dx code 250.00 so pharmacy would fill it

## 2013-01-20 ENCOUNTER — Inpatient Hospital Stay (HOSPITAL_COMMUNITY)
Admission: EM | Admit: 2013-01-20 | Discharge: 2013-01-22 | DRG: 690 | Disposition: A | Discharge status: Home Health | Payer: Medicare Other | Attending: Internal Medicine | Admitting: Internal Medicine

## 2013-01-20 ENCOUNTER — Emergency Department (HOSPITAL_COMMUNITY): Payer: Medicare Other

## 2013-01-20 ENCOUNTER — Encounter (HOSPITAL_COMMUNITY): Payer: Self-pay

## 2013-01-20 DIAGNOSIS — Z96659 Presence of unspecified artificial knee joint: Secondary | ICD-10-CM

## 2013-01-20 DIAGNOSIS — Z79899 Other long term (current) drug therapy: Secondary | ICD-10-CM

## 2013-01-20 DIAGNOSIS — E860 Dehydration: Secondary | ICD-10-CM

## 2013-01-20 DIAGNOSIS — I10 Essential (primary) hypertension: Secondary | ICD-10-CM

## 2013-01-20 DIAGNOSIS — E1151 Type 2 diabetes mellitus with diabetic peripheral angiopathy without gangrene: Secondary | ICD-10-CM | POA: Diagnosis present

## 2013-01-20 DIAGNOSIS — M6282 Rhabdomyolysis: Secondary | ICD-10-CM

## 2013-01-20 DIAGNOSIS — M109 Gout, unspecified: Secondary | ICD-10-CM | POA: Diagnosis present

## 2013-01-20 DIAGNOSIS — J45909 Unspecified asthma, uncomplicated: Secondary | ICD-10-CM | POA: Diagnosis present

## 2013-01-20 DIAGNOSIS — E11319 Type 2 diabetes mellitus with unspecified diabetic retinopathy without macular edema: Secondary | ICD-10-CM | POA: Diagnosis present

## 2013-01-20 DIAGNOSIS — E1139 Type 2 diabetes mellitus with other diabetic ophthalmic complication: Secondary | ICD-10-CM | POA: Diagnosis present

## 2013-01-20 DIAGNOSIS — E119 Type 2 diabetes mellitus without complications: Secondary | ICD-10-CM

## 2013-01-20 DIAGNOSIS — E785 Hyperlipidemia, unspecified: Secondary | ICD-10-CM | POA: Diagnosis present

## 2013-01-20 DIAGNOSIS — N390 Urinary tract infection, site not specified: Secondary | ICD-10-CM

## 2013-01-20 DIAGNOSIS — I959 Hypotension, unspecified: Secondary | ICD-10-CM | POA: Diagnosis present

## 2013-01-20 DIAGNOSIS — N179 Acute kidney failure, unspecified: Secondary | ICD-10-CM

## 2013-01-20 DIAGNOSIS — Z8673 Personal history of transient ischemic attack (TIA), and cerebral infarction without residual deficits: Secondary | ICD-10-CM

## 2013-01-20 LAB — CBC WITH DIFFERENTIAL/PLATELET
Basophils Relative: 0 % (ref 0–1)
Eosinophils Absolute: 0 10*3/uL (ref 0.0–0.7)
HCT: 38.5 % (ref 36.0–46.0)
Hemoglobin: 13 g/dL (ref 12.0–15.0)
MCH: 33 pg (ref 26.0–34.0)
MCHC: 33.8 g/dL (ref 30.0–36.0)
Monocytes Absolute: 1.7 10*3/uL — ABNORMAL HIGH (ref 0.1–1.0)
Monocytes Relative: 10 % (ref 3–12)

## 2013-01-20 LAB — BASIC METABOLIC PANEL
BUN: 39 mg/dL — ABNORMAL HIGH (ref 6–23)
Chloride: 99 mEq/L (ref 96–112)
Creatinine, Ser: 1.18 mg/dL — ABNORMAL HIGH (ref 0.50–1.10)
GFR calc Af Amer: 49 mL/min — ABNORMAL LOW (ref >90)
GFR calc non Af Amer: 42 mL/min — ABNORMAL LOW (ref >90)

## 2013-01-20 LAB — URINALYSIS, ROUTINE W REFLEX MICROSCOPIC
Bilirubin Urine: NEGATIVE
Glucose, UA: NEGATIVE mg/dL
Specific Gravity, Urine: 1.014 (ref 1.005–1.030)
Urobilinogen, UA: 0.2 mg/dL (ref 0.0–1.0)

## 2013-01-20 LAB — URINE MICROSCOPIC-ADD ON

## 2013-01-20 LAB — CG4 I-STAT (LACTIC ACID): Lactic Acid, Venous: 1.53 mmol/L (ref 0.5–2.2)

## 2013-01-20 MED ORDER — ACETAMINOPHEN 500 MG PO TABS
1000.0000 mg | ORAL_TABLET | Freq: Four times a day (QID) | ORAL | Status: DC | PRN
Start: 2013-01-20 — End: 2013-01-22
  Administered 2013-01-21 – 2013-01-22 (×3): 1000 mg via ORAL
  Filled 2013-01-20 (×3): qty 2

## 2013-01-20 MED ORDER — SODIUM CHLORIDE 0.9 % IJ SOLN: 3.0000 mL | Freq: Two times a day (BID) | INTRAMUSCULAR | Status: DC

## 2013-01-20 MED ORDER — CLOPIDOGREL BISULFATE 75 MG PO TABS
75.0000 mg | ORAL_TABLET | Freq: Every day | ORAL | Status: DC
  Administered 2013-01-21 – 2013-01-22 (×2): 75 mg via ORAL
  Filled 2013-01-20 (×3): qty 1

## 2013-01-20 MED ORDER — ONDANSETRON HCL 4 MG/2ML IJ SOLN: 4.0000 mg | Freq: Three times a day (TID) | INTRAMUSCULAR | Status: AC | PRN

## 2013-01-20 MED ORDER — INSULIN ASPART 100 UNIT/ML ~~LOC~~ SOLN
0.0000 [IU] | Freq: Three times a day (TID) | SUBCUTANEOUS | Status: DC
  Administered 2013-01-20: 3 [IU] via SUBCUTANEOUS
  Administered 2013-01-21: 2 [IU] via SUBCUTANEOUS
  Administered 2013-01-21: 1 [IU] via SUBCUTANEOUS

## 2013-01-20 MED ORDER — CARVEDILOL 25 MG PO TABS
25.0000 mg | ORAL_TABLET | Freq: Two times a day (BID) | ORAL | Status: DC
  Administered 2013-01-21 – 2013-01-22 (×3): 25 mg via ORAL
  Filled 2013-01-20 (×5): qty 1

## 2013-01-20 MED ORDER — DARIFENACIN HYDROBROMIDE ER 15 MG PO TB24
15.0000 mg | ORAL_TABLET | Freq: Every day | ORAL | Status: DC
  Administered 2013-01-21 – 2013-01-22 (×2): 15 mg via ORAL
  Filled 2013-01-20 (×2): qty 1

## 2013-01-20 MED ORDER — ACETAMINOPHEN 325 MG PO TABS
650.0000 mg | ORAL_TABLET | Freq: Four times a day (QID) | ORAL | Status: DC | PRN
  Filled 2013-01-20: qty 2

## 2013-01-20 MED ORDER — SODIUM CHLORIDE 0.9 % IV SOLN: Freq: Once | INTRAVENOUS | Status: DC

## 2013-01-20 MED ORDER — DEXTROSE 5 % IV SOLN
1.0000 g | Freq: Once | INTRAVENOUS | Status: AC
  Administered 2013-01-20: 1 g via INTRAVENOUS
  Filled 2013-01-20: qty 10

## 2013-01-20 MED ORDER — ACETAMINOPHEN 650 MG RE SUPP: 650.0000 mg | Freq: Four times a day (QID) | RECTAL | Status: DC | PRN

## 2013-01-20 MED ORDER — SODIUM CHLORIDE 0.9 % IV SOLN
INTRAVENOUS | Status: AC
  Administered 2013-01-20 – 2013-01-21 (×2): via INTRAVENOUS

## 2013-01-20 MED ORDER — SODIUM CHLORIDE 0.9 % IV BOLUS (SEPSIS)
1000.0000 mL | Freq: Once | INTRAVENOUS | Status: AC
  Administered 2013-01-20: 1000 mL via INTRAVENOUS

## 2013-01-20 MED ORDER — SODIUM CHLORIDE 0.9 % IV SOLN
INTRAVENOUS | Status: AC
  Administered 2013-01-20: 19:00:00 via INTRAVENOUS

## 2013-01-20 MED ORDER — DEXTROSE 5 % IV SOLN
1.0000 g | INTRAVENOUS | Status: DC
  Administered 2013-01-21 – 2013-01-22 (×2): 1 g via INTRAVENOUS
  Filled 2013-01-20 (×2): qty 10

## 2013-01-20 MED ORDER — HEPARIN SODIUM (PORCINE) 5000 UNIT/ML IJ SOLN
5000.0000 [IU] | Freq: Three times a day (TID) | INTRAMUSCULAR | Status: DC
  Administered 2013-01-20 – 2013-01-22 (×5): 5000 [IU] via SUBCUTANEOUS
  Filled 2013-01-20 (×10): qty 1

## 2013-01-20 MED ORDER — GABAPENTIN 300 MG PO CAPS
300.0000 mg | ORAL_CAPSULE | Freq: Every day | ORAL | Status: DC
  Administered 2013-01-21: 300 mg via ORAL
  Filled 2013-01-20 (×2): qty 1

## 2013-01-20 NOTE — ED Notes (Addendum)
Pt presents with c/o of a fall that occurred early this morning. Pt is complaining of left knee pain and right hip pain. Pt says her left knee has been hurting since Easter because she was standing on her feet all day. Also has a hx of sciatica on the right side so this hip pain in chronic for her. No new complaints since the fall but daughter wanted her mom to come and get checked out. Pt says she slipped in the floor from the bed into a sitting position. Pt says after the fall she was unable to get off the toilet and daughter says she was "out of sorts" and hunched over after the fall. Pt also believes she has a UTI. No burning with urination but experiencing frequency. Pt states no LOC and did not hit her head.

## 2013-01-20 NOTE — ED Provider Notes (Signed)
History     CSN: [id number]  Arrival date & time 01/20/13  4098   First MD Initiated Contact with Patient 01/20/13 [phone]      Chief Complaint  Patient presents with  . Fall    (Consider location/radiation/quality/duration/timing/severity/associated sxs/prior treatment) HPI Comments: Pt comes in with cc of HTN, DM, OA, knee replacements - left and sciatica comes in with cc of weakness. Pt states that she became really weak all of a sudden last night.At 3 am, she slid down from her bed (no fall, no head trauma), and was unable to get herself up due to weakness. She was able to ambulate fine yday. She also complains of severe left knee pain (replacement surgery 2 years ago) and right sided sciatica pain - both got aggravated on Easter. Pt denies nausea, emesis, fevers, chills, chest pains, shortness of breath, headaches, abdominal pain, uti like symptoms.  Pt has been on the floor since 3 am and had to be helped up by son today. She thinks her weakness is due to combination of pain and primary weakness.   Patient is a 79 y.o. female presenting with fall. The history is provided by the patient.  Fall Pertinent negatives include no fever, no abdominal pain, no nausea, no vomiting and no hematuria.    Past Medical History  Diagnosis Date  . Incontinence   . Wears glasses   . Arthritis     right knee, left wrist  . Diabetes mellitus     diet controlled  . Hypertension   . Asthma     related to allergies when working; resolved  . Obesity, unspecified   . Dyslipidemia   . Gout   . DJD (degenerative joint disease)   . Endometrial cancer hx  . Diabetic retinopathy(362.0)   . Membranous glomerulonephropathy   . Osteopenia L hip, 4/06  . DDD (degenerative disc disease), lumbar 4/06  . Sliding hiatal hernia small sliding hiatal hernia  . Breast cancer 7/08 Inrasine ductal L breast  . Hepatitis A history(in [place]-1950's)  . Mini stroke DrWillis  . Colon polyps      4/09--adenomatous. 04/2010--normal.  Repeat 2014    Past Surgical History  Procedure Laterality Date  . Breast lumpectomy      left  . Abdominal hysterectomy      endometrial cancer  . Kidney stones    . Left middle finger    . Skin cancer excision    . Total knee arthroplasty      left  . Cataract extraction    . Breast lumpectomy  8/08 L Breast ([name])  . Colonoscopy  8/08 [name] (normal)  . Replacement total knee  Left  . Appendectomy      Family History  Problem Relation Age of Onset  . Heart attack Father   . Diabetes Father   . Heart attack Mother   . Hypertension Mother   . Diabetes Mother   . Hypertension Brother   . Melanoma Brother     History  Substance Use Topics  . Smoking status: Never Smoker   . Smokeless tobacco: Never Used  . Alcohol Use: No    OB History   Grav Para Term Preterm Abortions TAB SAB Ect Mult Living                  Review of Systems  Constitutional: Positive for fatigue. Negative for fever and activity change.  HENT: Negative for facial swelling and neck pain.   Respiratory: Negative  for cough, shortness of breath and wheezing.   Cardiovascular: Negative for chest pain.  Gastrointestinal: Negative for nausea, vomiting, abdominal pain, diarrhea, constipation, blood in stool and abdominal distention.  Genitourinary: Negative for hematuria and difficulty urinating.  Musculoskeletal: Positive for myalgias, arthralgias and gait problem.  Skin: Negative for color change.  Neurological: Negative for speech difficulty.  Hematological: Does not bruise/bleed easily.  Psychiatric/Behavioral: Negative for confusion.    Allergies  Codeine; Contrast media; Morphine and related; Statins; Toprol xl; and Penicillins  Home Medications   Current Outpatient Rx  Name  Route  Sig  Dispense  Refill  . acetaminophen (TYLENOL) 500 MG tablet   Oral   Take 1,000 mg by mouth 2 (two) times daily.           Marland Kitchen allopurinol (ZYLOPRIM) 300  MG tablet      TAKE 1/2 TABLET DAILY   90 tablet   1     NEEDS REFILLS   . amLODipine (NORVASC) 5 MG tablet      TAKE 1 TABLET BY MOUTH EVERY DAY   30 tablet   1   . B Complex-C (SUPER B COMPLEX PO)   Oral   Take 1 capsule by mouth daily.           . Calcium Carbonate-Vitamin D (CALTRATE 600+D PO)   Oral   Take 2 tablets by mouth daily.         . carvedilol (COREG) 25 MG tablet      TAKE 1 & 1/2 TABLETS BY MOUTH TWICE A DAY   45 tablet   0   . Cholecalciferol (VITAMIN D) 2000 UNITS tablet   Oral   Take 2,000 Units by mouth daily.         . clopidogrel (PLAVIX) 75 MG tablet      TAKE 1 TABLET BY MOUTH EVERY DAY   90 tablet   0     Please Schedule Appt   . Coenzyme Q10 (CO Q 10 PO)   Oral   Take 2 tablets by mouth daily.         . Evening Primrose Oil CAPS   Oral   Take 1 capsule by mouth daily.           Marland Kitchen gabapentin (NEURONTIN) 300 MG capsule   Oral   Take 300 mg by mouth daily.         . hydrochlorothiazide (,MICROZIDE/HYDRODIURIL,) 12.5 MG capsule   Oral   Take 12.5 mg by mouth daily.           Marland Kitchen ibandronate (BONIVA) 150 MG tablet   Oral   Take 1 tablet (150 mg total) by mouth every 30 (thirty) days. Take in the morning with a full glass of water, on an empty stomach, and do not take anything else by mouth or lie down for the next 30 min.   1 tablet   11   . KLOR-CON 10 10 MEQ tablet      TAKE 1 TABLET BY MOUTH EVERY DAY   30 tablet   4   . Krill Oil 500 MG CAPS   Oral   Take 1 capsule by mouth daily.         Marland Kitchen letrozole (FEMARA) 2.5 MG tablet      TAKE 1 TABLET BY MOUTH EVERY DAY   30 tablet   PRN   . lisinopril (PRINIVIL,ZESTRIL) 20 MG tablet      TAKE 1 TABLET BY MOUTH EVERY DAY  60 tablet   3   . losartan (COZAAR) 100 MG tablet      TAKE 1 TABLET BY MOUTH TWICE A DAY   60 tablet   5   . solifenacin (VESICARE) 5 MG tablet   Oral   Take 10 mg by mouth daily.           Marland Kitchen ZETIA 10 MG tablet      TAKE  1 TABLET BY MOUTH EVERY DAY   30 tablet   5   . clindamycin (CLEOCIN) 150 MG capsule   Oral   Take 600 mg by mouth See admin instructions. 1 hour prior to teeth cleaning          . glucose blood test strip      Use as instructed   100 each   0     Pt uses Freestyle test strips and lancets. DX code ...   . Misc Natural Products (OSTEO BI-FLEX ADV TRIPLE ST PO)   Oral   Take 2 capsules by mouth daily.         . Needles & Syringes MISC   Does not apply   1 each by Does not apply route once. Pt uses freestyles lancets   100 each   0     Dx code 250.00     BP 90/72  Pulse 103  Temp(Src) 98.8 F (37.1 C) (Oral)  Resp 26  SpO2 95%  Physical Exam  Constitutional: She is oriented to person, place, and time. She appears well-developed and well-nourished.  HENT:  Head: Normocephalic and atraumatic.  Eyes: Conjunctivae and EOM are normal. Pupils are equal, round, and reactive to light.  Neck: Normal range of motion. Neck supple.  Cardiovascular: Normal rate, regular rhythm, normal heart sounds and intact distal pulses.   No murmur heard. Pulmonary/Chest: Effort normal. No respiratory distress. She has no wheezes.  Abdominal: Soft. Bowel sounds are normal. She exhibits no distension. There is no tenderness. There is no rebound and no guarding.  Musculoskeletal:  Bilateral knee have some effusion, no overlying erythema, callor. ROM of the knee is intact. There is tenderness on the left knee - infrapatellar. Knee is stable, no increased laxity, hip is non tender with internal and external rotations. Strength of bilateral lower extremity is 4+/5 - however, left side does appear to be slightly weaker.  Neurological: She is alert and oriented to person, place, and time. No cranial nerve deficit.  Skin: Skin is warm and dry.    ED Course  Procedures (including critical care time)  Labs Reviewed  CBC WITH DIFFERENTIAL - Abnormal; Notable for the following:    WBC 17.5 (*)     Neutrophils Relative 79 (*)    Neutro Abs 13.9 (*)    Lymphocytes Relative 11 (*)    Monocytes Absolute 1.7 (*)    All other components within normal limits  BASIC METABOLIC PANEL - Abnormal; Notable for the following:    Glucose, Bld 222 (*)    BUN 39 (*)    Creatinine, Ser 1.18 (*)    GFR calc non Af Amer 42 (*)    GFR calc Af Amer 49 (*)    All other components within normal limits  CK - Abnormal; Notable for the following:    Total CK 3358 (*)    All other components within normal limits  TROPONIN I  URINALYSIS, ROUTINE W REFLEX MICROSCOPIC  CG4 I-STAT (LACTIC ACID)   No results found.   No  diagnosis found.    MDM   Date: 01/20/2013  Rate: 96  Rhythm: normal sinus rhythm  QRS Axis: normal  Intervals: normal  ST/T Wave abnormalities: normal  Conduction Disutrbances: none  Narrative Interpretation: unremarkable   DDx: Sepsis syndrome Rhabdomyolosis ACS syndrome DKA ICH Stroke Infection - pneumonia/UTI/Cellulitis Dehydration Electrolyte abnormality Tox syndrome  Pt comes in with cc of weakness and leg pain. No focal neuro deficit, except for mild LLE weakness. Will get CT head. Concerns for stroke low. Pt is out of the stroke treatment window regardless. Pt has been on the floor since 3 am - so concerns for rhabdo, and dehydration, elyte abn. We will also get ACS labs and check urine.  Pt was unable to ambulate for me, will request formal assessment by our techs,     Derwood Kaplan, MD 01/20/13 1142

## 2013-01-20 NOTE — ED Notes (Signed)
Hospitalist at bedside 

## 2013-01-20 NOTE — H&P (Signed)
[facility] History and Physical  Kristin Willis WUJ:[id number] DOB: Mar 22, 1929 DOA: 01/20/2013  Referring physician: Dr. Rhunette Croft PCP: Lavonda Jumbo, MD  Specialists: none  Chief Complaint: fall  HPI: Kristin Willis is a 79 y.o. female has a past medical history significant for HTN, DM2, remote CVA with residual right LE weakness on chronic Plavix, left knee replacement 2 years ago presents with a chief complaint of weakness and inability to get up after lying on the floor. Over night last night she slid down to the floor from her bed and was unable to get back up. She denies falling, denies focal weakness, denies HA, lightheadedness or dizziness, denies chest pain, just states that her right knee was painful and that is the reason why she couldn't get up. She did not call anyone since she did not want to disturb her family and waited until 4:30 am when she called for help. She denies nausea/abdominal pain, denies fever/chills. She has a history of UTIs in the past usually accompanied by dysuria, but denies it now. Does feel some "pressure" in the lower abdominal area. Denies breathing difficulties. Denies new numbness or tingling, denies slurred speech or facial asymmetry; daughter in the room endorses that her mother is at baseline. In the ED patient with positive UA for UTI, elevated CK and AKI and hospitalist service was asked for admission.   Review of Systems: as per HPI otherwise negative   Past Medical History  Diagnosis Date  . Incontinence   . Wears glasses   . Arthritis     right knee, left wrist  . Diabetes mellitus     diet controlled  . Hypertension   . Asthma     related to allergies when working; resolved  . Obesity, unspecified   . Dyslipidemia   . Gout   . DJD (degenerative joint disease)   . Diabetic retinopathy(362.0)   . Membranous glomerulonephropathy   . Osteopenia L hip, 4/06  . DDD (degenerative disc disease), lumbar 4/06  . Sliding hiatal hernia small  sliding hiatal hernia  . Hepatitis A history(in [place]-1950's)  . Mini stroke DrWillis  . Colon polyps     4/09--adenomatous. 04/2010--normal.  Repeat 2014  . Endometrial cancer hx  . Breast cancer 7/08 Inrasine ductal L breast   Past Surgical History  Procedure Laterality Date  . Breast lumpectomy      left  . Abdominal hysterectomy      endometrial cancer  . Kidney stones    . Left middle finger    . Skin cancer excision    . Total knee arthroplasty      left  . Cataract extraction    . Breast lumpectomy  8/08 L Breast ([name])  . Colonoscopy  8/08 [name] (normal)  . Replacement total knee  Left  . Appendectomy     Social History:  reports that she has never smoked. She has never used smokeless tobacco. She reports that she does not drink alcohol or use illicit drugs.  Allergies  Allergen Reactions  . Codeine Nausea And Vomiting  . Contrast Media (Iodinated Diagnostic Agents) Nausea And Vomiting  . Morphine And Related Nausea And Vomiting  . Statins Other (See Comments)    lethargic  . Toprol Xl (Metoprolol Tartrate)     Symptoms of heart attack, per pt, told not to take it again (?chest pain)  . Penicillins Hives, Swelling and Rash    Family History  Problem Relation Age of Onset  .  Heart attack Father   . Diabetes Father   . Heart attack Mother   . Hypertension Mother   . Diabetes Mother   . Hypertension Brother   . Melanoma Brother     Prior to Admission medications   Medication Sig Start Date End Date Taking? Authorizing Provider  acetaminophen (TYLENOL) 500 MG tablet Take 1,000 mg by mouth 2 (two) times daily.     Yes Historical Provider, MD  allopurinol (ZYLOPRIM) 300 MG tablet TAKE 1/2 TABLET DAILY 11/27/12  Yes Joselyn Arrow, MD  amLODipine (NORVASC) 5 MG tablet TAKE 1 TABLET BY MOUTH EVERY DAY 12/22/12  Yes Joselyn Arrow, MD  B Complex-C (SUPER B COMPLEX PO) Take 1 capsule by mouth daily.     Yes Historical Provider, MD  Calcium Carbonate-Vitamin D  (CALTRATE 600+D PO) Take 2 tablets by mouth daily.   Yes Historical Provider, MD  carvedilol (COREG) 25 MG tablet TAKE 1 & 1/2 TABLETS BY MOUTH TWICE A DAY 01/08/13  Yes Joselyn Arrow, MD  Cholecalciferol (VITAMIN D) 2000 UNITS tablet Take 2,000 Units by mouth daily.   Yes Historical Provider, MD  clopidogrel (PLAVIX) 75 MG tablet TAKE 1 TABLET BY MOUTH EVERY DAY 01/01/13  Yes York Spaniel, MD  Coenzyme Q10 (CO Q 10 PO) Take 2 tablets by mouth daily.   Yes Historical Provider, MD  Evening Primrose Oil CAPS Take 1 capsule by mouth daily.     Yes Historical Provider, MD  gabapentin (NEURONTIN) 300 MG capsule Take 300 mg by mouth daily.   Yes Historical Provider, MD  hydrochlorothiazide (,MICROZIDE/HYDRODIURIL,) 12.5 MG capsule Take 12.5 mg by mouth daily.     Yes Historical Provider, MD  ibandronate (BONIVA) 150 MG tablet Take 1 tablet (150 mg total) by mouth every 30 (thirty) days. Take in the morning with a full glass of water, on an empty stomach, and do not take anything else by mouth or lie down for the next 30 min. 06/09/12  Yes Joselyn Arrow, MD  KLOR-CON 10 10 MEQ tablet TAKE 1 TABLET BY MOUTH EVERY DAY 09/12/12  Yes Joselyn Arrow, MD  Krill Oil 500 MG CAPS Take 1 capsule by mouth daily.   Yes Historical Provider, MD  letrozole (FEMARA) 2.5 MG tablet TAKE 1 TABLET BY MOUTH EVERY DAY 05/15/12  Yes Lowella Dell, MD  lisinopril (PRINIVIL,ZESTRIL) 20 MG tablet TAKE 1 TABLET BY MOUTH EVERY DAY 10/28/12  Yes Joselyn Arrow, MD  losartan (COZAAR) 100 MG tablet TAKE 1 TABLET BY MOUTH TWICE A DAY 08/25/12  Yes Joselyn Arrow, MD  solifenacin (VESICARE) 5 MG tablet Take 10 mg by mouth daily.     Yes Historical Provider, MD  ZETIA 10 MG tablet TAKE 1 TABLET BY MOUTH EVERY DAY 10/01/12  Yes Joselyn Arrow, MD  clindamycin (CLEOCIN) 150 MG capsule Take 600 mg by mouth See admin instructions. 1 hour prior to teeth cleaning     Historical Provider, MD  glucose blood test strip Use as instructed 01/11/13   Joselyn Arrow, MD  Misc  Natural Products (OSTEO BI-FLEX ADV TRIPLE ST PO) Take 2 capsules by mouth daily.    Historical Provider, MD  Needles & Syringes MISC 1 each by Does not apply route once. Pt uses freestyles lancets 01/11/13   Joselyn Arrow, MD   Physical Exam: Filed Vitals:   01/20/13 0939  BP: 90/72  Pulse: 103  Temp: 98.8 F (37.1 C)  TempSrc: Oral  Resp: 26  SpO2: 95%    General:  No apparent distress, pleasant caucasian female  Eyes: PERRL, EOMI, no scleral icterus  ENT: dry mm  Neck: supple, no JVD  Cardiovascular: regular rate without MRG; 2+ peripheral pulses except LLE 1+  Respiratory: CTA biL, good air movement without wheezing, rhonchi or crackled  Abdomen: soft, non tender to palpation, positive bowel sounds, no guarding, no rebound  Skin: no rashes  Musculoskeletal: no peripheral edema  Psychiatric: normal mood and affect  Neurologic: CN 2-12 grossly intact, MS 5/5 in all 4  Labs on Admission:  Basic Metabolic Panel:  Recent Labs Lab 01/20/13 1030  NA 135  K 3.6  CL 99  CO2 27  GLUCOSE 222*  BUN 39*  CREATININE 1.18*  CALCIUM 9.7   CBC:  Recent Labs Lab 01/20/13 1030  WBC 17.5*  NEUTROABS 13.9*  HGB 13.0  HCT 38.5  MCV 97.7  PLT 172   Cardiac Enzymes:  Recent Labs Lab 01/20/13 1030  CKTOTAL 3358*  TROPONINI <0.30   Radiological Exams on Admission: Ct Head Wo Contrast  01/20/2013  *RADIOLOGY REPORT*  Clinical Data: Bilateral leg weakness.  CT HEAD WITHOUT CONTRAST  Technique:  Contiguous axial images were obtained from the base of the skull through the vertex without contrast.  Comparison: Brain MRI 09/26/2010.  Findings: There is patchy and confluent hypoattenuation in the subcortical and periventricular deep white matter compatible with chronic microvascular ischemic change.  No evidence of acute abnormality including infarction, hemorrhage, mass lesion, mass effect, midline shift or abnormal extra-axial fluid collection is identified.  IMPRESSION:  1.   No acute finding. 2.  Extensive chronic microvascular ischemic change.   Original Report Authenticated By: Holley Dexter, M.D.    Dg Chest Port 1 View  01/20/2013  *RADIOLOGY REPORT*  Clinical Data: Fall.  PORTABLE CHEST - 1 VIEW  Comparison: Plain films of the chest 06/24/2010.  Findings: Heart size is normal.  Lungs are clear.  No pneumothorax or pleural effusion.  Marked degenerative disease about the right glenohumeral joint is noted.  IMPRESSION: No acute finding.   Original Report Authenticated By: Holley Dexter, M.D.     EKG: Independently reviewed.  Assessment/Plan Active Problems:   Type II or unspecified type diabetes mellitus without mention of complication, not stated as uncontrolled   Essential hypertension, benign   UTI (urinary tract infection)   Rhabdomyolysis   UTI - has positive UA, many bacteria, cultures pending. - empiric ceftriaxone  Rhabdo - elevated CK on presentation - IV fluids, 2L bolus in ED, NS at 100 cc/h for 24 hours - repeat CK and BMP in am  AKI - due to dehydration and #2 - IV fluids as above  Weakness - due to dehydration/infection - no high concern for new CVA given no new symptoms but little threshold to reimage with MRI if her weakness is not better with Abx and fluids - PT/OT  History of DM - last HBA1C 5.7  - SSI  HTN - hold Norvasc and Losartan given borderline hypotension / AKI.  - resume Coreg starting tomorrow.   DVT Prophylaxis - heparin   Code Status: Full  Family Communication: daughter in the room  Disposition Plan: admit inpatient  Time spent: 32  Tabbetha Kutscher M. Elvera Lennox, MD [facility] Pager [phone]  If 7PM-7AM, please contact night-coverage [email or web address] Password [email or web address] 01/20/2013, 2:52 PM

## 2013-01-21 DIAGNOSIS — I10 Essential (primary) hypertension: Secondary | ICD-10-CM

## 2013-01-21 LAB — CBC
Hemoglobin: 11.4 g/dL — ABNORMAL LOW (ref 12.0–15.0)
MCH: 32.8 pg (ref 26.0–34.0)
MCHC: 33.2 g/dL (ref 30.0–36.0)
MCV: 98.6 fL (ref 78.0–100.0)
RBC: 3.48 MIL/uL — ABNORMAL LOW (ref 3.87–5.11)

## 2013-01-21 LAB — BASIC METABOLIC PANEL
CO2: 26 mEq/L (ref 19–32)
Calcium: 8.5 mg/dL (ref 8.4–10.5)
Creatinine, Ser: 0.86 mg/dL (ref 0.50–1.10)
GFR calc non Af Amer: 62 mL/min — ABNORMAL LOW (ref >90)
Glucose, Bld: 130 mg/dL — ABNORMAL HIGH (ref 70–99)

## 2013-01-21 MED ORDER — LETROZOLE 2.5 MG PO TABS
2.5000 mg | ORAL_TABLET | Freq: Every day | ORAL | Status: DC
  Administered 2013-01-21 – 2013-01-22 (×2): 2.5 mg via ORAL
  Filled 2013-01-21 (×2): qty 1

## 2013-01-21 MED ORDER — SODIUM CHLORIDE 0.9 % IV SOLN
INTRAVENOUS | Status: DC
  Administered 2013-01-21 – 2013-01-22 (×2): via INTRAVENOUS

## 2013-01-21 NOTE — Evaluation (Signed)
Physical Therapy Evaluation Patient Details Name: Kristin Willis MRN: [id number] DOB: 01-03-1929 Today's Date: 01/21/2013 Time: 0902-0925 PT Time Calculation (min): 23 min  PT Assessment / Plan / Recommendation Clinical Impression  Pt is an 79 year old female admitted for essential hypertension, weakness, fall at home.  Pt would benefit from acute PT services in order to improve transfers and ambulation to by increasing LE strength in preparation for d/c home with daughter to assist.  Pt performed hip flexion and LAQ in recliner.  Pt agreeable to use RW for safety upon d/c home and have daughter assist when OOB, discussed safe technique for sit to stands as pt reports increased difficulty performing those at home lately.    PT Assessment  Patient needs continued PT services    Follow Up Recommendations  Home health PT;Supervision for mobility/OOB    Does the patient have the potential to tolerate intense rehabilitation      Barriers to Discharge        Equipment Recommendations  Rolling walker with 5" wheels (pt is uncertain if she still has her RW)    Recommendations for Other Services     Frequency Min 3X/week    Precautions / Restrictions Precautions Precautions: Fall   Pertinent Vitals/Pain Pt reports slight pain in L knee and R sciatica but able to tolerate ambulation well.     Mobility  Bed Mobility Bed Mobility: Supine to Sit Supine to Sit: 4: Min assist;HOB elevated Details for Bed Mobility Assistance: slight assist for trunk Transfers Transfers: Sit to Stand;Stand to Sit Sit to Stand: 4: Min assist;With upper extremity assist;From bed;From elevated surface Stand to Sit: 4: Min assist;With upper extremity assist;To chair/3-in-1 Details for Transfer Assistance: verbal cues for safe technique, assist to rise and control descent Ambulation/Gait Ambulation/Gait Assistance: 4: Min guard Ambulation Distance (Feet): 160 Feet Assistive device: Rolling  walker Ambulation/Gait Assistance Details: verbal cues for RW distance, pt agreeable to use RW at home (usually no assistive device and cane with community)   Gait Pattern: Step-through pattern;Trunk flexed Gait velocity: decreased    Exercises General Exercises - Lower Extremity Long Arc Quad: AROM;Both;5 reps;Seated Hip Flexion/Marching: AROM;Both;5 reps;Seated   PT Diagnosis: Difficulty walking;Generalized weakness  PT Problem List: Decreased strength;Decreased safety awareness;Decreased mobility;Decreased knowledge of use of DME PT Treatment Interventions: DME instruction;Gait training;Patient/family education;Functional mobility training;Therapeutic activities;Therapeutic exercise   PT Goals Acute Rehab PT Goals PT Goal Formulation: With patient Time For Goal Achievement: 01/28/13 Potential to Achieve Goals: Good Pt will go Supine/Side to Sit: with modified independence PT Goal: Supine/Side to Sit - Progress: Goal set today Pt will go Sit to Stand: with modified independence PT Goal: Sit to Stand - Progress: Goal set today Pt will go Stand to Sit: with modified independence PT Goal: Stand to Sit - Progress: Goal set today Pt will Ambulate: 51 - 150 feet;with least restrictive assistive device;with modified independence PT Goal: Ambulate - Progress: Goal set today Pt will Perform Home Exercise Program: with supervision, verbal cues required/provided PT Goal: Perform Home Exercise Program - Progress: Goal set today  Visit Information  Last PT Received On: 01/21/13 Assistance Needed: +1    Subjective Data  Subjective: When I had my knee done, they said I had to walk 30 feet before I could go home since I refused SNF. Patient Stated Goal: home with HHPT   Prior Functioning  Home Living Lives With: Alone Available Help at Discharge: Family Type of Home: House Home Access: Stairs to enter Entrance  Stairs-Number of Steps: 2 Entrance Stairs-Rails: None Home Layout: Able to  live on main level with bedroom/bathroom Bathroom Toilet: Handicapped height Home Adaptive Equipment: Walker - rolling;Straight cane;Grab bars around toilet Prior Function Level of Independence: Independent Communication Communication: No difficulties    Cognition  Cognition Arousal/Alertness: Awake/alert Behavior During Therapy: WFL for tasks assessed/performed Overall Cognitive Status: Within Functional Limits for tasks assessed    Extremity/Trunk Assessment Right Lower Extremity Assessment RLE ROM/Strength/Tone: Deficits RLE ROM/Strength/Tone Deficits: hip flexion 3/5, hip abduction and adduction 3+/5, knee extension 3+/5, DF 4/5 RLE Sensation: WFL - Light Touch Left Lower Extremity Assessment LLE ROM/Strength/Tone: Deficits LLE ROM/Strength/Tone Deficits: hip flexion 3/5, hip abduction and adduction 3+/5, knee extension 3/5, DF 4/5 LLE Sensation: Deficits LLE Sensation Deficits: pt reports hx of numbness in foot since "ministroke"    Balance    End of Session PT - End of Session Equipment Utilized During Treatment: Gait belt Activity Tolerance: Patient tolerated treatment well Patient left: in chair;with call bell/phone within reach;with family/visitor present Nurse Communication: Mobility status  GP     Chia Rock,KATHrine E 01/21/2013, 12:27 PM  Zenovia Jarred, PT, DPT 01/21/2013 Pager: [phone]

## 2013-01-21 NOTE — Evaluation (Signed)
Occupational Therapy Evaluation Patient Details Name: Kristin Willis MRN: [id number] DOB: [birth year] Today's Date: 01/21/2013 Time: 7829-5621 OT Time Calculation (min): 33 min  OT Assessment / Plan / Recommendation Clinical Impression  This 79 year old female was admitted for HTN and weakness She slid off chair at home. Pt has a h/o HTN, DM, remote CVA and incontinence.   Pt was independent with basic adls and light housekeeping and now requires min to mod A.  She is appropriate for skilled OT to increase independence with adls, with min guard level goals.    OT Assessment  Patient needs continued OT Services    Follow Up Recommendations  Home health OT;Supervision/Assistance - 24 hour--family plans to hire assistance as needed    Barriers to Discharge      Equipment Recommendations  None recommended by OT    Recommendations for Other Services    Frequency  Min 2X/week    Precautions / Restrictions Precautions Precautions: Fall Restrictions Weight Bearing Restrictions: No   Pertinent Vitals/Pain No c/o pain    ADL  Grooming: Set up Where Assessed - Grooming: Unsupported sitting Upper Body Bathing: Set up Where Assessed - Upper Body Bathing: Unsupported sitting Lower Body Bathing: Minimal assistance (with AE, mod for sit to stand) Where Assessed - Lower Body Bathing: Supported sit to stand Upper Body Dressing: Minimal assistance Where Assessed - Upper Body Dressing: Unsupported sitting Lower Body Dressing: Minimal assistance (with AE, mod for sit to stand) Where Assessed - Lower Body Dressing: Supported sit to Pharmacist, hospital: Minimal assistance (mod for sit to stand ) Toilet Transfer Method: Sit to Barista: Bedside commode Toileting - Clothing Manipulation and Hygiene: Minimal assistance Where Assessed - Engineer, mining and Hygiene: Sit to stand from 3-in-1 or toilet Equipment Used: Rolling walker Transfers/Ambulation  Related to ADLs: ambulated 3' to 3:1 commode ADL Comments: Pt uses AE for adls.  Sit to stand and bed mobility are limiting factors for her right now.  Daughter states they plan to hire assistance when she leaves on Sunday    OT Diagnosis: Generalized weakness  OT Problem List: Decreased strength;Decreased activity tolerance;Impaired balance (sitting and/or standing) OT Treatment Interventions: Self-care/ADL training;DME and/or AE instruction;Therapeutic activities;Patient/family education;Balance training   OT Goals Acute Rehab OT Goals OT Goal Formulation: With patient Time For Goal Achievement: 02/04/13 Potential to Achieve Goals: Good ADL Goals Pt Will Perform Lower Body Bathing: with min assist;Sit to stand from chair;with adaptive equipment (min guard) ADL Goal: Lower Body Bathing - Progress: Goal set today Pt Will Perform Lower Body Dressing: with min assist;with adaptive equipment;Sit to stand from chair (min guard) ADL Goal: Lower Body Dressing - Progress: Goal set today Pt Will Transfer to Toilet: with min assist;Ambulation;3-in-1 (min guard) ADL Goal: Toilet Transfer - Progress: Goal set today Pt Will Perform Tub/Shower Transfer: with min assist;Ambulation;Shower seat without back;Shower transfer (min guard) ADL Goal: Tub/Shower Transfer - Progress: Goal set today  Visit Information  Last OT Received On: 01/21/13 Assistance Needed: +1    Subjective Data  Subjective: I don't want to go anywhere but home.   Patient Stated Goal: home   Prior Functioning     Home Living Lives With: Alone Available Help at Discharge:  (daughter until Sun.  Plans to hire help) Type of Home: House Home Access: Stairs to enter [employer] of Steps: 2 Entrance Stairs-Rails: None Home Layout: Able to live on main level with bedroom/bathroom Bathroom Shower/Tub: Tub/shower unit;Walk-in shower (accessible tub--door that  opens) Firefighter: Handicapped height Home Adaptive  Equipment: Walker - rolling;Straight cane;Grab bars around toilet;Raised toilet seat with rails;Bedside commode/3-in-1;Shower chair with back Additional Comments: removed bars from shower Prior Function Level of Independence: Independent Comments: maid for heavy housework Communication Communication: No difficulties         Vision/Perception     Copywriter, advertising Arousal/Alertness: Awake/alert Behavior During Therapy: WFL for tasks assessed/performed Overall Cognitive Status: Within Functional Limits for tasks assessed    Extremity/Trunk Assessment Right Upper Extremity Assessment RUE ROM/Strength/Tone: WFL for tasks assessed (strength grossly 4-/5 bil) Left Upper Extremity Assessment LUE ROM/Strength/Tone: WFL for tasks assessed     Mobility Bed Mobility Bed Mobility: Supine to Sit Supine to Sit: 3: Mod assist;With rails Details for Bed Mobility Assistance: cues for technique; discussed rolling and sitting up next time as this was difficult Transfers Sit to Stand: 4: Min assist;With upper extremity assist;From bed;From elevated surface;3: Mod assist;From chair/3-in-1 (min to mod A for sit to stand--variable) Stand to Sit: 4: Min assist;With upper extremity assist;To chair/3-in-1 Details for Transfer Assistance: vcs for hand placement     Exercise    Balance     End of Session OT - End of Session Activity Tolerance: Patient tolerated treatment well Patient left: in bed;with call bell/phone within reach;with family/visitor present  GO     Arvon Schreiner 01/21/2013, 3:23 PM Marica Otter, OTR/L [phone] 01/21/2013

## 2013-01-21 NOTE — Care Management Note (Addendum)
    Page 1 of 2   01/22/2013     7:18:19 PM   CARE MANAGEMENT NOTE 01/22/2013  Patient:  Kristin Willis, Kristin Willis   Account Number:  [id number]  Date Initiated:  01/21/2013  Documentation initiated by:  Lanier Clam  Subjective/Objective Assessment:   ADMITTED W/HTN,WEAKNESS.HX:HTN,DM.     Action/Plan:   FROM HOME ALONE.HAS DTR SUPPORT.HAS PCP,PHARMACY.HAS CANE,3N1.   Anticipated DC Date:  01/22/2013   Anticipated DC Plan:  HOME W HOME HEALTH SERVICES      DC Planning Services  CM consult      Choice offered to / List presented to:  C-1 Patient   DME arranged  Levan Hurst      DME agency  [facility]     [facility] arranged  HH-1 RN  HH-2 PT  HH-3 OT  HH-4 NURSE'S AIDE      HH agency  [facility]   Status of service:  Completed, signed off Medicare Important Message given?   (If response is "NO", the following Medicare IM given date fields will be blank) Date Medicare IM given:   Date Additional Medicare IM given:    Discharge Disposition:  HOME W HOME HEALTH SERVICES  Per UR Regulation:  Reviewed for med. necessity/level of care/duration of stay  If discussed at Long Length of Stay Meetings, dates discussed:    Comments:  01/22/13 Arita Severtson RN,BSN NCM [facility] [phone] [facility] CHOSEN FOR HH.TC [facility] [phone] INFORMED OF D/C, & HH ORDERS & RW.RW BROUGHT TO RM.  01/21/13 Alter Moss RN,BSN NCM [phone] PT/OT,RW-HH.PROVIDED PATIENT/DTR [facility] AGENCY LIST FOR [place].PROVIDED W/PRIVATE DUTY CARE LIST ALSO.RECOMMEND HHRN/PT/OT,RW.

## 2013-01-21 NOTE — Progress Notes (Signed)
[facility] PROGRESS NOTE  Kristin Willis ZOX:[id number] DOB: [birth year] DOA: 01/20/2013 PCP: Lavonda Jumbo, MD  Assessment/Plan: Active Problems:   Type II or unspecified type diabetes mellitus without mention of complication, not stated as uncontrolled: CBGs improving as urinary infection treated.    Essential hypertension, benign: Stable.   UTI (urinary tract infection): Responding to IV fluids and Rocephin. Awaiting cultures.     Rhabdomyolysis: Continue IV fluids.  ARF: Mild. Resolved with IV fluids.    Code Status: Full code  Family Communication: Spoke with patient's oldest daughter at the bedside.  Disposition Plan: Likely will be home tomorrow with home health   Consultants:  None  Procedures:  None  Antibiotics:  IV Rocephin day 2  HPI/Subjective: Feeling better. Is alert and speech is clear. No focal weakness, but still feels somewhat fatigued  Objective: Filed Vitals:   01/20/13 1700 01/20/13 2201 01/21/13 0438 01/21/13 1329  BP: 119/43 142/42 133/48 109/36  Pulse: 98 66 90 97  Temp: 98.3 F (36.8 C) 98 F (36.7 C) 98.7 F (37.1 C) 99.1 F (37.3 C)  TempSrc: Oral Oral Oral Oral  Resp:  20 21 20   Height: 5\' 7"  (1.702 m)     Weight: 97.1 kg (214 lb 1.1 oz)     SpO2: 94% 96% 96% 98%    Intake/Output Summary (Last 24 hours) at 01/21/13 1440 Last data filed at 01/21/13 0900  Gross per 24 hour  Intake 1357.5 ml  Output    875 ml  Net  482.5 ml   Filed Weights   01/20/13 1700  Weight: 97.1 kg (214 lb 1.1 oz)    Exam:   General:  Alert and oriented x3, no acute distress   Cardiovascular: Regular rate and rhythm, S1-S2  Respiratory: Clear to auscultation bilaterally  Abdomen: Soft, nontender, nondistended, positive bowel sounds  Musculoskeletal: No clubbing or cyanosis or edema   Data Reviewed: Basic Metabolic Panel:  Recent Labs Lab 01/20/13 1030 01/21/13 0520  NA 135 141  K 3.6 3.3*  CL 99 106  CO2 27 26  GLUCOSE  222* 130*  BUN 39* 26*  CREATININE 1.18* 0.86  CALCIUM 9.7 8.5   CBC:  Recent Labs Lab 01/20/13 1030 01/21/13 0520  WBC 17.5* 13.0*  NEUTROABS 13.9*  --   HGB 13.0 11.4*  HCT 38.5 34.3*  MCV 97.7 98.6  PLT 172 160   Cardiac Enzymes:  Recent Labs Lab 01/20/13 1030 01/21/13 0520  CKTOTAL 3358* 3933*  TROPONINI <0.30  --     CBG:  Recent Labs Lab 01/20/13 1751 01/21/13 0803 01/21/13 1204  GLUCAP 241* 129* 182*    No results found for this or any previous visit (from the past 240 hour(s)).   Studies: Ct Head Wo Contrast  01/20/2013    IMPRESSION:  1.  No acute finding. 2.  Extensive chronic microvascular ischemic change.   Original Report Authenticated By: Holley Dexter, M.D.    Dg Chest Port 1 View  01/20/2013    IMPRESSION: No acute finding.   Original Report Authenticated By: Holley Dexter, M.D.     Scheduled Meds: . sodium chloride   Intravenous Once  . carvedilol  25 mg Oral BID WC  . cefTRIAXone (ROCEPHIN)  IV  1 g Intravenous Q24H  . clopidogrel  75 mg Oral Q breakfast  . darifenacin  15 mg Oral Daily  . gabapentin  300 mg Oral Daily  . heparin  5,000 Units Subcutaneous Q8H  . insulin aspart  0-9  Units Subcutaneous TID WC  . sodium chloride  3 mL Intravenous Q12H   Continuous Infusions:   Active Problems:   Type II or unspecified type diabetes mellitus without mention of complication, not stated as uncontrolled   Essential hypertension, benign   UTI (urinary tract infection)   Rhabdomyolysis    Time spent: 20 min    Hollice Espy  [facility] Pager [phone]. If 7PM-7AM, please contact night-coverage at [email or web address], password [facility] 01/21/2013, 2:40 PM  LOS: 1 day

## 2013-01-22 LAB — CBC
MCH: 33.8 pg (ref 26.0–34.0)
MCHC: 34.2 g/dL (ref 30.0–36.0)
MCV: 98.8 fL (ref 78.0–100.0)
Platelets: 168 10*3/uL (ref 150–400)
RDW: 12.8 % (ref 11.5–15.5)

## 2013-01-22 LAB — URINE CULTURE

## 2013-01-22 MED ORDER — CIPROFLOXACIN HCL 500 MG PO TABS: 500.0000 mg | ORAL_TABLET | Freq: Two times a day (BID) | ORAL | Status: AC

## 2013-01-22 NOTE — Discharge Summary (Signed)
Physician Discharge Summary  Kristin Willis [facility] RUE:[id number] DOB: March 26, 1929 DOA: 01/20/2013  PCP: Lavonda Jumbo, MD  Admit date: 01/20/2013 Discharge date: 01/22/2013  Time spent: 25 minutes  Recommendations for Outpatient Follow-up:  1. Patient will follow up with her primary care physician as needed in the next 2 months 2. She'll be followed by home health with PT OT, RN, aide and rolling walker  Discharge Diagnoses:  Active Problems:   Type II or unspecified type diabetes mellitus without mention of complication, not stated as uncontrolled   Essential hypertension, benign   UTI (urinary tract infection)   Rhabdomyolysis   Discharge Condition: Improved, being discharged home  Diet recommendation: Low-sodium carb modified  Filed Weights   01/20/13 1700  Weight: 97.1 kg (214 lb 1.1 oz)    History of present illness:  On 5/8:Kristin Willis is a 79 y.o. female has a past medical history significant for HTN, DM2, remote CVA with residual right LE weakness on chronic Plavix, left knee replacement 2 years ago presents with a chief complaint of weakness and inability to get up after lying on the floor. Over night last night she slid down to the floor from her bed and was unable to get back up. She denies falling, denies focal weakness, denies HA, lightheadedness or dizziness, denies chest pain, just states that her right knee was painful and that is the reason why she couldn't get up. She did not call anyone since she did not want to disturb her family and waited until 4:30 am when she called for help. She denies nausea/abdominal pain, denies fever/chills. She has a history of UTIs in the past usually accompanied by dysuria, but denies it now. Does feel some "pressure" in the lower abdominal area. Denies breathing difficulties. Denies new numbness or tingling, denies slurred speech or facial asymmetry; daughter in the room endorses that her mother is at baseline. In the ED patient with positive UA  for UTI, elevated CK and AKI and hospitalist service was asked for admission.    Hospital Course:  Active Problems:   Type II or unspecified type diabetes mellitus without mention of complication, not stated as uncontrolled: True ascites only. A1c of 5.7.    Essential hypertension, benign: Stable. Antihypertensives held initially secondary to dehydration. Restart on discharge    UTI (urinary tract infection): Patient started on IV Rocephin. By hospital day 3, white blood cell count which was 17 on admission was down to 8.4. Patient will continue on several more days of by mouth Cipro to complete a total 7 day course. Urine grew out Escherichia coli. Cultures final are pending we'll follow those to ensure sensitivity. Patient showed no signs of stroke following treatment hydration and treatment of UTI. CT negative.    Rhabdomyolysis: Felt to be secondary to prolonged period on the floor. CPK minimally elevated around 3000. Patient was gently hydrated. Renal function which is minimally elevated at 1.18 on admission was normalized by day of discharge with creatinine of 0.86   Procedures:  None  Consultations:  None  Discharge Exam: Filed Vitals:   01/21/13 0438 01/21/13 1329 01/21/13 2120 01/22/13 0559  BP: 133/48 109/36 148/55 133/59  Pulse: 90 97 96 84  Temp: 98.7 F (37.1 C) 99.1 F (37.3 C) 100.1 F (37.8 C) 98.4 F (36.9 C)  TempSrc: Oral Oral Oral Oral  Resp: 21 20 22 20   Height:      Weight:      SpO2: 96% 98% 94% 92%  General: Alert and oriented x3, no acute distress Cardiovascular: Regular rate and rhythm, S1-S2 Respiratory: Clear to auscultation bilaterally Abdomen: Soft, nontender, nondistended, positive bowel sounds Extremities: No clubbing or cyanosis or edema  Discharge Instructions  Discharge Orders   Future Appointments Provider Department Dept Phone   04/21/2013 11:00 AM Delcie Roch [facility] [phone]   04/21/2013  11:30 AM Lowella Dell, MD [facility] MEDICAL ONCOLOGY [phone]   Future Orders Complete By Expires     Diet - low sodium heart healthy  As directed     Increase activity slowly  As directed         Medication List    TAKE these medications       acetaminophen 500 MG tablet  Commonly known as:  TYLENOL  Take 1,000 mg by mouth 2 (two) times daily.     allopurinol 300 MG tablet  Commonly known as:  ZYLOPRIM  TAKE 1/2 TABLET DAILY     amLODipine 5 MG tablet  Commonly known as:  NORVASC  TAKE 1 TABLET BY MOUTH EVERY DAY     CALTRATE 600+D PO  Take 2 tablets by mouth daily.     carvedilol 25 MG tablet  Commonly known as:  COREG  TAKE 1 & 1/2 TABLETS BY MOUTH TWICE A DAY     ciprofloxacin 500 MG tablet  Commonly known as:  CIPRO  Take 1 tablet (500 mg total) by mouth 2 (two) times daily.     clindamycin 150 MG capsule  Commonly known as:  CLEOCIN  Take 600 mg by mouth See admin instructions. 1 hour prior to teeth cleaning     clopidogrel 75 MG tablet  Commonly known as:  PLAVIX  TAKE 1 TABLET BY MOUTH EVERY DAY     CO Q 10 PO  Take 2 tablets by mouth daily.     Evening Primrose Oil Caps  Take 1 capsule by mouth daily.     gabapentin 300 MG capsule  Commonly known as:  NEURONTIN  Take 300 mg by mouth daily.     glucose blood test strip  Use as instructed     hydrochlorothiazide 12.5 MG capsule  Commonly known as:  MICROZIDE  Take 12.5 mg by mouth daily.     ibandronate 150 MG tablet  Commonly known as:  BONIVA  Take 1 tablet (150 mg total) by mouth every 30 (thirty) days. Take in the morning with a full glass of water, on an empty stomach, and do not take anything else by mouth or lie down for the next 30 min.     KLOR-CON 10 10 MEQ tablet  Generic drug:  potassium chloride  TAKE 1 TABLET BY MOUTH EVERY DAY     Krill Oil 500 MG Caps  Take 1 capsule by mouth daily.     letrozole 2.5 MG tablet  Commonly known as:  FEMARA  TAKE 1  TABLET BY MOUTH EVERY DAY     lisinopril 20 MG tablet  Commonly known as:  PRINIVIL,ZESTRIL  TAKE 1 TABLET BY MOUTH EVERY DAY     losartan 100 MG tablet  Commonly known as:  COZAAR  TAKE 1 TABLET BY MOUTH TWICE A DAY     Needles & Syringes Misc  1 each by Does not apply route once. Pt uses freestyles lancets     OSTEO BI-FLEX ADV TRIPLE ST PO  Take 2 capsules by mouth daily.     solifenacin  5 MG tablet  Commonly known as:  VESICARE  Take 10 mg by mouth daily.     SUPER B COMPLEX PO  Take 1 capsule by mouth daily.     Vitamin D 2000 UNITS tablet  Take 2,000 Units by mouth daily.     ZETIA 10 MG tablet  Generic drug:  ezetimibe  TAKE 1 TABLET BY MOUTH EVERY DAY       Allergies  Allergen Reactions  . Codeine Nausea And Vomiting  . Contrast Media (Iodinated Diagnostic Agents) Nausea And Vomiting  . Morphine And Related Nausea And Vomiting  . Statins Other (See Comments)    lethargic  . Toprol Xl (Metoprolol Tartrate)     Symptoms of heart attack, per pt, told not to take it again (?chest pain)  . Penicillins Hives, Swelling and Rash       Follow-up Information   Follow up with KNAPP,EVE A, MD. (As needed)    Contact information:   [address] [place] [address] [phone]        The results of significant diagnostics from this hospitalization (including imaging, microbiology, ancillary and laboratory) are listed below for reference.    Significant Diagnostic Studies: Ct Head Wo Contrast  01/20/2013    IMPRESSION:  1.  No acute finding. 2.  Extensive chronic microvascular ischemic change.   Original Report Authenticated By: Holley Dexter, M.D.    Dg Chest Port 1 View  01/20/2013   IMPRESSION: No acute finding.   Original Report Authenticated By: Holley Dexter, M.D.    Dg Knee Complete 4 Views Left  01/10/2013   IMPRESSION: Knee arthroplasty without complicating feature.   Original Report Authenticated By: Leanna Battles, M.D.      Microbiology: Recent Results (from the past 240 hour(s))  URINE CULTURE     Status: None   Collection Time    01/20/13 11:46 AM      Result Value Range Status   Specimen Description URINE, CLEAN CATCH   Final   Special Requests NONE   Final   Culture  Setup Time 01/20/2013 14:39   Final   Colony Count >=100,000 COLONIES/ML   Final   Culture ESCHERICHIA COLI   Final   Report Status PENDING   Incomplete     Labs: Basic Metabolic Panel:  Recent Labs Lab 01/20/13 1030 01/21/13 0520  NA 135 141  K 3.6 3.3*  CL 99 106  CO2 27 26  GLUCOSE 222* 130*  BUN 39* 26*  CREATININE 1.18* 0.86  CALCIUM 9.7 8.5   CBC:  Recent Labs Lab 01/20/13 1030 01/21/13 0520 01/22/13 0530  WBC 17.5* 13.0* 8.4  NEUTROABS 13.9*  --   --   HGB 13.0 11.4* 11.5*  HCT 38.5 34.3* 33.6*  MCV 97.7 98.6 98.8  PLT 172 160 168   Cardiac Enzymes:  Recent Labs Lab 01/20/13 1030 01/21/13 0520  CKTOTAL 3358* 3933*  TROPONINI <0.30  --    CBG:  Recent Labs Lab 01/21/13 0803 01/21/13 1204 01/21/13 1654 01/21/13 2211 01/22/13 0730  GLUCAP 129* 182* 118* 189* 115*       Signed:  KRISHNAN,SENDIL K  [facility] 01/22/2013, 12:28 PM

## 2013-01-28 ENCOUNTER — Other Ambulatory Visit: Payer: Self-pay | Admitting: Family Medicine

## 2013-02-18 ENCOUNTER — Other Ambulatory Visit: Payer: Self-pay | Admitting: Family Medicine

## 2013-03-03 ENCOUNTER — Telehealth: Payer: Self-pay | Admitting: Internal Medicine

## 2013-03-03 MED ORDER — POTASSIUM CHLORIDE ER 10 MEQ PO TBCR: 10.0000 meq | EXTENDED_RELEASE_TABLET | Freq: Every day | ORAL | Status: DC

## 2013-03-03 NOTE — Telephone Encounter (Signed)
Done

## 2013-03-03 NOTE — Telephone Encounter (Signed)
Pt needs a refill on kor-clon 10mg  to [facility]

## 2013-03-05 ENCOUNTER — Other Ambulatory Visit: Payer: Self-pay | Admitting: Family Medicine

## 2013-03-15 ENCOUNTER — Other Ambulatory Visit: Payer: Self-pay | Admitting: Family Medicine

## 2013-03-17 ENCOUNTER — Telehealth: Payer: Self-pay | Admitting: *Deleted

## 2013-03-17 NOTE — Telephone Encounter (Signed)
Patient scheduled for 04/22/13.

## 2013-03-17 NOTE — Telephone Encounter (Signed)
Kristin Willis with [facility] called and wanted to let you know that she has been contacting patient since April to try to get ABI scheduled. She wanted to know if you are still wanting this test done and should she still continue to try to get in touch with patient? Please advise. Thanks.

## 2013-03-17 NOTE — Telephone Encounter (Signed)
She was in hospital 5/8-10; d/c summary reviewed, and recommended she f/u here within 2 months.  No appt is scheduled.  Due for f/u.  I can reassess need at f/u visit.

## 2013-03-29 ENCOUNTER — Other Ambulatory Visit: Payer: Self-pay | Admitting: Neurology

## 2013-04-05 ENCOUNTER — Telehealth (HOSPITAL_COMMUNITY): Payer: Self-pay | Admitting: *Deleted

## 2013-04-06 ENCOUNTER — Other Ambulatory Visit: Payer: Self-pay | Admitting: Obstetrics & Gynecology

## 2013-04-06 ENCOUNTER — Telehealth: Payer: Self-pay | Admitting: Neurology

## 2013-04-06 DIAGNOSIS — Z853 Personal history of malignant neoplasm of breast: Secondary | ICD-10-CM

## 2013-04-06 DIAGNOSIS — Z9889 Other specified postprocedural states: Secondary | ICD-10-CM

## 2013-04-06 MED ORDER — CLOPIDOGREL BISULFATE 75 MG PO TABS: 75.0000 mg | ORAL_TABLET | Freq: Every day | ORAL | Status: DC

## 2013-04-06 NOTE — Addendum Note (Signed)
Addended by: Stephanie Acre on: 04/06/2013 06:25 PM   Modules accepted: Orders

## 2013-04-06 NOTE — Telephone Encounter (Signed)
I called patient. The patient went to a screening Center, and had carotid Doppler studies. This showed mild bilateral carotid stenosis. This at the most would require annual screening. The patient will continue the Plavix. She needs a refill this time.

## 2013-04-06 NOTE — Telephone Encounter (Signed)
Patient says she was told to inform all her physicians she has recently had carotid artery testing which showed mild plaque build up.

## 2013-04-18 ENCOUNTER — Encounter: Payer: Self-pay | Admitting: Family Medicine

## 2013-04-18 ENCOUNTER — Ambulatory Visit (INDEPENDENT_AMBULATORY_CARE_PROVIDER_SITE_OTHER): Payer: Medicare Other | Admitting: Family Medicine

## 2013-04-18 VITALS — BP 122/70 | HR 68 | Ht 67.0 in | Wt 206.0 lb

## 2013-04-18 DIAGNOSIS — M6282 Rhabdomyolysis: Secondary | ICD-10-CM

## 2013-04-18 DIAGNOSIS — E119 Type 2 diabetes mellitus without complications: Secondary | ICD-10-CM

## 2013-04-18 DIAGNOSIS — M109 Gout, unspecified: Secondary | ICD-10-CM

## 2013-04-18 DIAGNOSIS — I6529 Occlusion and stenosis of unspecified carotid artery: Secondary | ICD-10-CM

## 2013-04-18 DIAGNOSIS — I10 Essential (primary) hypertension: Secondary | ICD-10-CM

## 2013-04-18 DIAGNOSIS — N390 Urinary tract infection, site not specified: Secondary | ICD-10-CM

## 2013-04-18 DIAGNOSIS — R0989 Other specified symptoms and signs involving the circulatory and respiratory systems: Secondary | ICD-10-CM

## 2013-04-18 DIAGNOSIS — E7800 Pure hypercholesterolemia, unspecified: Secondary | ICD-10-CM

## 2013-04-18 MED ORDER — ALLOPURINOL 100 MG PO TABS: 100.0000 mg | ORAL_TABLET | Freq: Every day | ORAL | Status: DC
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
## 2013-04-18 NOTE — Patient Instructions (Addendum)
Change to the lower dose of allopurinol so that you don't have to cut the pills anymore.  I'm sending a copy of my note to Dr. Pasty Arch are a couple of labs that I want to make sure that he does when you have your blood drawn this week (blood count, CK--to follow up on the elevated muscle enzyme from your last hospitalization) and a chemistry panel to recheck the potassium)  You can ask Dr. Eliott Nine about the question of lisinopril and losartan.  You have been on both for quite some time.  I have no problem with you remaining on these, since your blood pressure is perfect and your kidneys have been stable.  I leave any recommendation regarding change up to Dr. Eliott Nine.  We will be rescheduling you for the ABI (circulation test for your feet).

## 2013-04-20 ENCOUNTER — Other Ambulatory Visit: Payer: Self-pay | Admitting: Physician Assistant

## 2013-04-20 DIAGNOSIS — C50919 Malignant neoplasm of unspecified site of unspecified female breast: Secondary | ICD-10-CM

## 2013-04-21 ENCOUNTER — Other Ambulatory Visit (HOSPITAL_BASED_OUTPATIENT_CLINIC_OR_DEPARTMENT_OTHER): Payer: Medicare Other | Admitting: Lab

## 2013-04-21 ENCOUNTER — Ambulatory Visit (HOSPITAL_BASED_OUTPATIENT_CLINIC_OR_DEPARTMENT_OTHER): Payer: Medicare Other | Admitting: Oncology

## 2013-04-21 VITALS — BP 155/82 | HR 76 | Temp 97.7°F | Resp 20 | Ht 67.0 in | Wt 208.7 lb

## 2013-04-21 DIAGNOSIS — M6282 Rhabdomyolysis: Secondary | ICD-10-CM

## 2013-04-21 DIAGNOSIS — Z170 Estrogen receptor positive status [ER+]: Secondary | ICD-10-CM

## 2013-04-21 DIAGNOSIS — C50919 Malignant neoplasm of unspecified site of unspecified female breast: Secondary | ICD-10-CM

## 2013-04-21 DIAGNOSIS — E119 Type 2 diabetes mellitus without complications: Secondary | ICD-10-CM

## 2013-04-21 LAB — CBC WITH DIFFERENTIAL/PLATELET
Basophils Absolute: 0.1 10*3/uL (ref 0.0–0.1)
Eosinophils Absolute: 0.2 10*3/uL (ref 0.0–0.5)
HGB: 13.8 g/dL (ref 11.6–15.9)
LYMPH%: 38.1 % (ref 14.0–49.7)
MCV: 100.7 fL (ref 79.5–101.0)
MONO#: 0.6 10*3/uL (ref 0.1–0.9)
MONO%: 10.1 % (ref 0.0–14.0)
NEUT#: 2.8 10*3/uL (ref 1.5–6.5)
Platelets: 174 10*3/uL (ref 145–400)

## 2013-04-21 LAB — COMPREHENSIVE METABOLIC PANEL (CC13)
Albumin: 3.3 g/dL — ABNORMAL LOW (ref 3.5–5.0)
Alkaline Phosphatase: 36 U/L — ABNORMAL LOW (ref 40–150)
BUN: 32.4 mg/dL — ABNORMAL HIGH (ref 7.0–26.0)
CO2: 30 mEq/L — ABNORMAL HIGH (ref 22–29)
Glucose: 98 mg/dl (ref 70–140)
Potassium: 4 mEq/L (ref 3.5–5.1)
Total Bilirubin: 0.68 mg/dL (ref 0.20–1.20)

## 2013-04-21 LAB — CK: Total CK: 35 U/L (ref 7–177)

## 2013-04-21 NOTE — Progress Notes (Signed)
ID: Kristin Willis   DOB: [birth year]  MR#: [id number]  WUJ#:[id number]  PCP: Lavonda Jumbo, MD GYN: SU:  OTHER MD:   HISTORY OF PRESENT ILLNESS: Kristin Willis had a screening mammogram at the breast center March 10, 2007, which showed a possible mass with some calcifications in the left breast.  Diagnostic left mammogram on July 3 showed a spiculated density suspicious for invasive ductal carcinoma, which was not palpable by physical exam.  Ultrasound showed an ill-defined hypoechoic mass 2 cm from the left nipple in the 12 o'clock position measuring 1.5 cm.  This was biopsied under ultrasound guidance the same day and showed ([id number] and [id number]) an invasive ductal carcinoma, which appeared at least intermediate grade.  It was 100% ER positive, 100% PR positive, had a borderline proliferation marker at 19% and was equivocal on the HercepTest at 2+, but negative by FISH with a ratio of 1.33.  With this information the patient was referred to Dr. Luisa Hart and on April 01, 2007 bilateral breast MRIs were obtained.  This showed only the solitary abnormality in the left breast measuring up to 1.4 cm.  The right breast and both axillae were unremarkable.  Accordingly, on April 28, 2007, Dr. Luisa Hart proceeded to left lumpectomy with sentinel lymph node mapping.  The final pathology [id number]) found a 1.7 cm infiltrating ductal carcinoma, grade 2, with negative margins, and 0 of 1 sentinel lymph node involved.  There was no evidence of lymphovascular invasion. Her subsequent history is as detailed below.  INTERVAL HISTORY: Kristin Willis returns today for followup of her breast cancer. The interval history is significant for her having had an episode of pneumonia. She also had a fall in the setting of a urinary tract infection and dehydration.  REVIEW OF SYSTEMS: She has sciatica more right than left, but this has not changed in intensity or frequency. Just problem walking up stairs. She has history of urinary tract  infections. She bruises easily. She has numbness in her left toe tips but no tingling or pain. She tells me her diabetes is well controlled just with diet. She enrolled in the new drug assistance program and has lost 26 pounds as a result. She had an extensive screening through a portable medical clinic which evaluated her carotid arteries, her peripheral arteries, and her EKG, and the only abnormality they found was an elevated body mass index. Otherwise a detailed review of systems today was entirely noncontributory  PAST MEDICAL HISTORY: Past Medical History  Diagnosis Date  . Incontinence   . Wears glasses   . Arthritis     right knee, left wrist  . Diabetes mellitus     diet controlled  . Hypertension   . Asthma     related to allergies when working; resolved  . Obesity, unspecified   . Dyslipidemia   . Gout   . DJD (degenerative joint disease)   . Diabetic retinopathy   . Membranous glomerulonephropathy     Dr. Eliott Nine  . Osteopenia L hip, 4/06  . DDD (degenerative disc disease), lumbar 4/06  . Sliding hiatal hernia small sliding hiatal hernia  . Hepatitis A history(in [place]-1950's)  . Mini stroke DrWillis  . Colon polyps     4/09--adenomatous. 04/2010--normal.  Repeat 2014  . Endometrial cancer hx  . Breast cancer 7/08 Inrasine ductal L breast  . Skin cancer     Dr. Terri Piedra    PAST SURGICAL HISTORY: Past Surgical History  Procedure Laterality Date  . Breast lumpectomy  left  . Abdominal hysterectomy      endometrial cancer  . Kidney stones    . Left middle finger    . Skin cancer excision    . Total knee arthroplasty      left  . Cataract extraction    . Breast lumpectomy  8/08 L Breast ([name])  . Colonoscopy  8/08 [name] (normal)  . Replacement total knee  Left  . Appendectomy    Significant for endometrial cancer, the patient being status post total hysterectomy with bilateral salpingo-oophorectomy in 1987.  The cancer was "level II" and she  received no adjuvant therapy.  She also had surgery to her third digit in the left hand, which is not fully mobile at this point.  She had reconstructive left kidney surgery in 1964 following problems with stones.  She is status post appendectomy.  She is status post bilateral cataract surgery.  She has a history of type 2 diabetes mellitus, which is now controlled with diet with a good hemoglobin A1c according to her report.  She has a history of membranous glomerulonephropathy followed by Dr. Camille Bal.  She has a history of hypercholesterolemia, a history of recurrent urinary tract infections, which has resolved, a history of hypertension, a history of osteoarthritis particularly involving the knees, and a history of osteoporosis.  FAMILY HISTORY Family History  Problem Relation Age of Onset  . Heart attack Father   . Diabetes Father   . Heart attack Mother   . Hypertension Mother   . Diabetes Mother   . Hypertension Brother   . Melanoma Brother   The patient's father died from myocardial infarction in the setting of diabetes at age 34.  The patient's mother died from complications of a cholecystectomy at the age of 3.  The patient has one brother who was  diagnosed with malignant melanoma.  GYNECOLOGIC HISTORY: She is Gx, P3, first pregnancy age 26, change of life in 34 when she had her surgery.  She never had hormone replacement.  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  Her husband, Burley Saver, died in 2023-09-17 from pulmonary fibrosis, which was felt to be secondary to reflux.  He also had prostate cancer, which was treated at this center successfully.  The patient's children are Edsel Petrin who lives in [place], works as a Child psychotherapist particularly with Hispanic women, a daughter in [place] who teaches second grade, and a son in [place], [place] who is a Chiropodist and works for [employer].  The patient has  five grandchildren. She is a member of the [employer].   ADVANCED DIRECTIVES: in place  HEALTH MAINTENANCE: History  Substance Use Topics  . Smoking status: Never Smoker   . Smokeless tobacco: Never Used  . Alcohol Use: No     Colonoscopy:  PAP:  Bone density:  Lipid panel:  Allergies  Allergen Reactions  . Codeine Nausea And Vomiting  . Contrast Media (Iodinated Diagnostic Agents) Nausea And Vomiting  . Morphine And Related Nausea And Vomiting  . Statins Other (See Comments)    lethargic  . Toprol Xl (Metoprolol Tartrate)     Symptoms of heart attack, per pt, told not to take it again (?chest pain)  . Clindamycin/Lincomycin Itching and Rash  . Penicillins Hives, Swelling and Rash    Current Outpatient Prescriptions  Medication Sig Dispense Refill  . acetaminophen (TYLENOL) 500 MG tablet Take 1,000 mg by mouth  2 (two) times daily.        Marland Kitchen allopurinol (ZYLOPRIM) 100 MG tablet Take 1 tablet (100 mg total) by mouth daily.  90 tablet  1  . amLODipine (NORVASC) 5 MG tablet TAKE 1 TABLET BY MOUTH EVERY DAY  30 tablet  5  . B Complex-C (SUPER B COMPLEX PO) Take 1 capsule by mouth daily.        . Calcium Carbonate-Vitamin D (CALTRATE 600+D PO) Take 2 tablets by mouth daily.      . carvedilol (COREG) 25 MG tablet TAKE 1 & 1/2 TABLETS BY MOUTH TWICE A DAY  45 tablet  5  . Cholecalciferol (VITAMIN D) 2000 UNITS tablet Take 2,000 Units by mouth daily.      . clopidogrel (PLAVIX) 75 MG tablet Take 1 tablet (75 mg total) by mouth daily.  30 tablet  5  . Coenzyme Q10 (CO Q 10 PO) Take 1 tablet by mouth daily.       . Evening Primrose Oil CAPS Take 1 capsule by mouth daily.        Marland Kitchen gabapentin (NEURONTIN) 300 MG capsule Take 300 mg by mouth daily.      Marland Kitchen glucose blood test strip Use as instructed  100 each  0  . hydrochlorothiazide (,MICROZIDE/HYDRODIURIL,) 12.5 MG capsule Take 12.5 mg by mouth daily.        Marland Kitchen ibandronate (BONIVA) 150 MG tablet Take 1 tablet (150 mg total) by mouth  every 30 (thirty) days. Take in the morning with a full glass of water, on an empty stomach, and do not take anything else by mouth or lie down for the next 30 min.  1 tablet  11  . Krill Oil 500 MG CAPS Take 1 capsule by mouth daily.      Marland Kitchen letrozole (FEMARA) 2.5 MG tablet TAKE 1 TABLET BY MOUTH EVERY DAY  30 tablet  PRN  . lisinopril (PRINIVIL,ZESTRIL) 20 MG tablet TAKE 1 TABLET BY MOUTH EVERY DAY  60 tablet  3  . losartan (COZAAR) 100 MG tablet TAKE 1 TABLET BY MOUTH TWICE A DAY  60 tablet  5  . Misc Natural Products (OSTEO BI-FLEX ADV TRIPLE ST PO) Take 2 capsules by mouth daily.      . Multiple Vitamin (MULTIVITAMIN) tablet Take 1 tablet by mouth daily.      . Needles & Syringes MISC 1 each by Does not apply route once. Pt uses freestyles lancets  100 each  0  . potassium chloride (KLOR-CON 10) 10 MEQ tablet Take 1 tablet (10 mEq total) by mouth daily.  30 tablet  4  . solifenacin (VESICARE) 5 MG tablet Take 10 mg by mouth daily.        Marland Kitchen ZETIA 10 MG tablet TAKE 1 TABLET BY MOUTH EVERY DAY  30 tablet  5   No current facility-administered medications for this visit.    OBJECTIVE: Elderly white woman who walks with a cane Filed Vitals:   04/21/13 1152  BP: 155/82  Pulse: 76  Temp: 97.7 F (36.5 C)  Resp: 20     Body mass index is 32.68 kg/(m^2).    ECOG FS: 2  Sclerae unicteric, pupils equal round and reactive to light Oropharynx clear No cervical or supraclavicular adenopathy Lungs no rales or rhonchi Heart regular rate and rhythm Abd obese, benign MSK moderate scoliosis, no focal spinal tenderness including no pain in the lumbosacral spine area Neuro: nonfocal, well oriented, pleasant affect Breasts: The right breast is  unremarkable. The left breast is status post lumpectomy and radiation. The nipple is chronically inverted. There is no evidence of local recurrence. Left axilla is benign  LAB RESULTS: Lab Results  Component Value Date   WBC 5.9 04/21/2013   NEUTROABS 2.8  04/21/2013   HGB 13.8 04/21/2013   HCT 41.5 04/21/2013   MCV 100.7 04/21/2013   PLT 174 04/21/2013      Chemistry      Component Value Date/Time   NA 143 04/21/2013 1112   NA 141 01/21/2013 0520   K 4.0 04/21/2013 1112   K 3.3* 01/21/2013 0520   CL 106 01/21/2013 0520   CO2 30* 04/21/2013 1112   CO2 26 01/21/2013 0520   BUN 32.4* 04/21/2013 1112   BUN 26* 01/21/2013 0520   CREATININE 0.8 04/21/2013 1112   CREATININE 0.86 01/21/2013 0520   CREATININE 0.84 01/10/2013 1115      Component Value Date/Time   CALCIUM 10.0 04/21/2013 1112   CALCIUM 8.5 01/21/2013 0520   ALKPHOS 36* 04/21/2013 1112   ALKPHOS 32* 01/10/2013 1115   AST 18 04/21/2013 1112   AST 13 01/10/2013 1115   ALT 16 04/21/2013 1112   ALT 12 01/10/2013 1115   BILITOT 0.68 04/21/2013 1112   BILITOT 0.9 01/10/2013 1115       Lab Results  Component Value Date   LABCA2 21 01/29/2012    No components found with this basename: [id number]    No results found for this basename: INR,  in the last 168 hours  Urinalysis    Component Value Date/Time   COLORURINE YELLOW 01/20/2013 1146    STUDIES: No results found. Routine mammography scheduled for later this month  ASSESSMENT: 79 y.o. [place] woman status post left lumpectomy and sentinel lymph node biopsy August of 2008 for a T1 N0 (Stage I) invasive ductal carcinoma, grade 2, strongly estrogen receptor and progesterone receptor positive, HER-2 negative, with a borderline MIB-1. She opted to forego radiation and started tamoxifen September of 2008. After a TIA January 2012 she was switched to Femara on which she continues with good tolerance.   PLAN: The plan is to continue letrozole to September of 2015. She is going to continue to work on her weight, and we'll try to increase her exercise tolerance. Otherwise she knows to call for any problems that may develop before next visit here, which will be in one year.  Kristin Willis C    04/21/2013

## 2013-04-27 ENCOUNTER — Encounter (HOSPITAL_COMMUNITY): Payer: Medicare Other

## 2013-04-28 ENCOUNTER — Ambulatory Visit (HOSPITAL_COMMUNITY)
Admission: RE | Admit: 2013-04-28 | Discharge: 2013-04-28 | Disposition: A | Discharge status: Home / Self Care | Payer: Medicare Other | Source: Ambulatory Visit | Attending: Family Medicine | Admitting: Family Medicine

## 2013-04-28 ENCOUNTER — Ambulatory Visit: Payer: Medicare Other | Admitting: Oncology

## 2013-04-28 ENCOUNTER — Other Ambulatory Visit: Payer: Medicare Other | Admitting: Lab

## 2013-04-28 DIAGNOSIS — R0989 Other specified symptoms and signs involving the circulatory and respiratory systems: Secondary | ICD-10-CM

## 2013-04-28 DIAGNOSIS — I739 Peripheral vascular disease, unspecified: Secondary | ICD-10-CM | POA: Insufficient documentation

## 2013-04-28 DIAGNOSIS — I10 Essential (primary) hypertension: Secondary | ICD-10-CM

## 2013-04-28 DIAGNOSIS — E119 Type 2 diabetes mellitus without complications: Secondary | ICD-10-CM

## 2013-04-28 NOTE — Progress Notes (Signed)
ABI Completed. Mild arterial insufficiency noted. Kristin Willis

## 2013-05-05 ENCOUNTER — Other Ambulatory Visit: Payer: Self-pay | Admitting: Family Medicine

## 2013-05-12 ENCOUNTER — Ambulatory Visit
Admission: RE | Admit: 2013-05-12 | Discharge: 2013-05-12 | Disposition: A | Discharge status: Home / Self Care | Payer: Medicare Other | Source: Ambulatory Visit | Attending: Obstetrics & Gynecology | Admitting: Obstetrics & Gynecology

## 2013-05-12 DIAGNOSIS — Z9889 Other specified postprocedural states: Secondary | ICD-10-CM

## 2013-05-12 DIAGNOSIS — Z853 Personal history of malignant neoplasm of breast: Secondary | ICD-10-CM

## 2013-05-18 ENCOUNTER — Other Ambulatory Visit: Payer: Self-pay | Admitting: Oncology

## 2013-05-26 ENCOUNTER — Other Ambulatory Visit: Payer: Self-pay | Admitting: Oncology

## 2013-06-20 ENCOUNTER — Encounter: Payer: Self-pay | Admitting: *Deleted

## 2013-06-27 ENCOUNTER — Other Ambulatory Visit: Payer: Self-pay | Admitting: Family Medicine

## 2013-06-30 ENCOUNTER — Other Ambulatory Visit: Payer: Self-pay | Admitting: Family Medicine

## 2013-07-06 ENCOUNTER — Telehealth: Payer: Self-pay | Admitting: *Deleted

## 2013-07-06 NOTE — Telephone Encounter (Signed)
Patient walked in office today after procedure at [facility] to discuss the side effects of Femara and her hair thinning. This is a major concern to her and would like to know if there is an alternative or if she could go back on Tamoxifen. Discussed with Dr Darnelle Catalan, and based on patient history of TIA/Stroke, we would not put her back on Tamoxifen.  However, patient has done well with her treatment since 2008 and can stop taking the Femara for now and we can discuss other treatment with next office visit. Will notify patient later today, as she states she is out running errands most of today.

## 2013-07-11 ENCOUNTER — Ambulatory Visit (INDEPENDENT_AMBULATORY_CARE_PROVIDER_SITE_OTHER): Payer: Medicare Other | Admitting: Family Medicine

## 2013-07-11 ENCOUNTER — Encounter: Payer: Self-pay | Admitting: Family Medicine

## 2013-07-11 VITALS — BP 108/62 | HR 68 | Ht 67.0 in | Wt 208.0 lb

## 2013-07-11 DIAGNOSIS — E7800 Pure hypercholesterolemia, unspecified: Secondary | ICD-10-CM

## 2013-07-11 DIAGNOSIS — E119 Type 2 diabetes mellitus without complications: Secondary | ICD-10-CM

## 2013-07-11 DIAGNOSIS — I10 Essential (primary) hypertension: Secondary | ICD-10-CM

## 2013-07-11 DIAGNOSIS — M109 Gout, unspecified: Secondary | ICD-10-CM

## 2013-07-11 DIAGNOSIS — I739 Peripheral vascular disease, unspecified: Secondary | ICD-10-CM

## 2013-07-11 DIAGNOSIS — G629 Polyneuropathy, unspecified: Secondary | ICD-10-CM

## 2013-07-11 DIAGNOSIS — G609 Hereditary and idiopathic neuropathy, unspecified: Secondary | ICD-10-CM

## 2013-07-11 LAB — COMPREHENSIVE METABOLIC PANEL
ALT: 10 U/L (ref 0–35)
AST: 14 U/L (ref 0–37)
Albumin: 3.9 g/dL (ref 3.5–5.2)
Alkaline Phosphatase: 31 U/L — ABNORMAL LOW (ref 39–117)
Potassium: 4.2 mEq/L (ref 3.5–5.3)
Sodium: 139 mEq/L (ref 135–145)
Total Protein: 6.1 g/dL (ref 6.0–8.3)

## 2013-07-11 LAB — URIC ACID: Uric Acid, Serum: 4.3 mg/dL (ref 2.4–7.0)

## 2013-07-11 LAB — LIPID PANEL
LDL Cholesterol: 70 mg/dL (ref 0–99)
Total CHOL/HDL Ratio: 4 Ratio
VLDL: 31 mg/dL (ref 0–40)

## 2013-07-11 NOTE — Patient Instructions (Signed)
Stop taking the lisinopril. Continue current dose of losartan Continue current dose of amlodipine and coreg. Call with home blood pressure readings in 1 month, checking your BP once or twice a week. Will wait to make any decision regarding the coreg dose until after seeing her BP readings next month.  Once you finish the 300mg  allopurinol (which you are cutting in half), switch to the 100mg  tablets (and do NOT cut in half)

## 2013-07-11 NOTE — Progress Notes (Signed)
Chief Complaint  Patient presents with  . Diabetes    fasting med check. Would like to see vascular doctor about the bruising she is having.    Patient presents for med check and labs.  She doesn't like having to cut the coreg, and is wondering if dose could be decreased to make it easier.  She knows that her BP's have been running normal/low, so it hoping to be able to lower the dose to just 1 tablet BID.  Blood pressures elsewhere (has a home cuff) 100-128/60's.  Denies dizziness, headaches, chest pain.  Denies side effects of medications. Dr. Eliott Nine recommended not continuing her on the ACEI/ARB combination.  She was diagnosed with a UTI and put on Cipro. She never had any urinary symptoms, so doesn't notice a difference since treatment.  DM--diet controlled, never on meds.  Her eye exams are every August. Sugars are running 87-127 usually (once 155). Denies polydipsia, polyuria, vision changes, hypoglycemia, numbness/tingling.  She lost weight with nutrisystem last year.  She has maintained her weight loss, but not lost further.  [facility] to [facility], doing water aerobics.  She cut back on salt in her diet.  Breast cancer: Stopped the letrozole, rx'd by Dr. Darnelle Catalan, told she could stop it at this point.  Stopped last Wed due to hair loss.  H/o TIA: She sees Dr. Anne Hahn, who has her on Plavix.  She has appt in 10/2013, but is on cancellation list for sooner appointment.  She is complaining of easy bruising (which isn't new). Denies any bleeding.  CBC was normal in August  Past Medical History  Diagnosis Date  . Incontinence   . Wears glasses   . Arthritis     right knee, left wrist  . Diabetes mellitus     diet controlled  . Hypertension   . Asthma     related to allergies when working; resolved  . Obesity, unspecified   . Dyslipidemia   . Gout   . DJD (degenerative joint disease)   . Diabetic retinopathy   . Membranous  glomerulonephropathy     Dr. Eliott Nine  . Osteopenia L hip, 4/06  . DDD (degenerative disc disease), lumbar 4/06  . Sliding hiatal hernia small sliding hiatal hernia  . Hepatitis A history(in [place]-1950's)  . Mini stroke DrWillis  . Colon polyps     4/09--adenomatous. 04/2010--normal.  Repeat 2014  . Endometrial cancer hx  . Breast cancer 7/08 Inrasine ductal L breast  . Skin cancer     Dr. Terri Piedra   Past Surgical History  Procedure Laterality Date  . Breast lumpectomy      left  . Abdominal hysterectomy      endometrial cancer  . Kidney stones    . Left middle finger    . Skin cancer excision    . Total knee arthroplasty      left  . Cataract extraction    . Breast lumpectomy  8/08 L Breast ([name])  . Colonoscopy  8/08 [name] (normal)  . Replacement total knee  Left  . Appendectomy     History   Social History  . Marital Status: Married    Spouse Name: N/A    Number of Children: N/A  . Years of Education: N/A   Occupational History  . Not on file.   Social History Main Topics  . Smoking status: Never Smoker   . Smokeless tobacco: Never Used  . Alcohol Use:  No  . Drug Use: No  . Sexual Activity: No   Other Topics Concern  . Not on file   Social History Narrative   Lives alone.  Daughter lives in [place], daughter lives in [place], son in [place], [place].  Widowed (husband was in [place])   Current outpatient prescriptions:acetaminophen (TYLENOL) 500 MG tablet, Take 1,000 mg by mouth 2 (two) times daily.  , Disp: , Rfl: ;  allopurinol (ZYLOPRIM) 100 MG tablet, Take 50 mg by mouth daily., Disp: , Rfl: ;  allopurinol (ZYLOPRIM) 300 MG tablet, Take 150 mg by mouth daily., Disp: , Rfl: ;  amLODipine (NORVASC) 5 MG tablet, TAKE 1 TABLET BY MOUTH EVERY DAY, Disp: 30 tablet, Rfl: 5 B Complex-C (SUPER B COMPLEX PO), Take 1 capsule by mouth daily.  , Disp: , Rfl: ;  Calcium Carbonate-Vitamin D (CALTRATE 600+D PO), Take 2 tablets by mouth daily., Disp: ,  Rfl: ;  carvedilol (COREG) 25 MG tablet, TAKE 1 & 1/2 TABLETS BY MOUTH TWICE A DAY, Disp: 45 tablet, Rfl: 5;  Cholecalciferol (VITAMIN D) 2000 UNITS tablet, Take 2,000 Units by mouth daily., Disp: , Rfl:  clopidogrel (PLAVIX) 75 MG tablet, Take 1 tablet (75 mg total) by mouth daily., Disp: 30 tablet, Rfl: 5;  Coenzyme Q10 (CO Q 10 PO), Take 1 tablet by mouth daily. , Disp: , Rfl: ;  Evening Primrose Oil CAPS, Take 1 capsule by mouth daily.  , Disp: , Rfl: ;  gabapentin (NEURONTIN) 300 MG capsule, TAKE ONE CAPSULE BY MOUTH AT BEDTIME, Disp: 30 capsule, Rfl: 11;  glucose blood test strip, Use as instructed, Disp: 100 each, Rfl: 0 hydrochlorothiazide (,MICROZIDE/HYDRODIURIL,) 12.5 MG capsule, Take 12.5 mg by mouth daily.  , Disp: , Rfl: ;  ibandronate (BONIVA) 150 MG tablet, TAKE 1 TAB EVERY 30 DAYS EVERY MORNING WITH H20 ON EMPTY STOMACH. NO FOOD/LAY DOWN FOR 30 MIN, Disp: 1 tablet, Rfl: 5;  Krill Oil 500 MG CAPS, Take 1 capsule by mouth daily., Disp: , Rfl: ;  losartan (COZAAR) 100 MG tablet, TAKE 1 TABLET BY MOUTH TWICE A DAY, Disp: 60 tablet, Rfl: 5 Misc Natural Products (OSTEO BI-FLEX ADV TRIPLE ST PO), Take 2 capsules by mouth daily., Disp: , Rfl: ;  Multiple Vitamin (MULTIVITAMIN) tablet, Take 1 tablet by mouth daily., Disp: , Rfl: ;  Needles & Syringes MISC, 1 each by Does not apply route once. Pt uses freestyles lancets, Disp: 100 each, Rfl: 0;  potassium chloride (KLOR-CON 10) 10 MEQ tablet, Take 1 tablet (10 mEq total) by mouth daily., Disp: 30 tablet, Rfl: 4 solifenacin (VESICARE) 5 MG tablet, Take 5 mg by mouth daily. , Disp: , Rfl: ;  Specialty Vitamins Products ([employer] HAIR/SKIN/NAILS PO), Take 2 each by mouth daily., Disp: , Rfl: ;  ZETIA 10 MG tablet, TAKE 1 TABLET BY MOUTH EVERY DAY, Disp: 30 tablet, Rfl: 5  Allergies  Allergen Reactions  . Codeine Nausea And Vomiting  . Contrast Media [Iodinated Diagnostic Agents] Nausea And Vomiting  . Morphine And Related Nausea And Vomiting  . Statins  Other (See Comments)    lethargic  . Toprol Xl [Metoprolol Tartrate]     Symptoms of heart attack, per pt, told not to take it again (?chest pain)  . Clindamycin/Lincomycin Itching and Rash  . Penicillins Hives, Swelling and Rash   ROS:  Denies fevers, chills, nausea, vomiting, abdominal pain, bowel changes.  Denies headaches, dizziness, chest pain, palpitations, shortness of breath. Gets small amount of phlegm up just  in the mornings.  Having some occasional sniffles and blood-tinged mucus. No dysuria, urgency frequency (symptoms controlled with Vesicare). Occasional indigestion after certain foods Right knee pain  PHYSICAL EXAM: BP 108/62  Pulse 68  Ht 5\' 7"  (1.702 m)  Wt 208 lb (94.348 kg)  BMI 32.57 kg/m2 Well developed, pleasant elderly female, appearing younger than stated age. HEENT: PERRL conjunctiva clear  Neck: no lymphadenopathy, thyromegaly or carotid bruit  Heart: regular rate and rhythm  Lungs: clear bilaterally  Back: no CVA tenderness Abdomen: soft, nontender, no mass  Extremities: Palpable DP pulses bilaterally, but faint, more easily felt on R than left. Diminished PT bilaterally. Brisk cap refill. Decreased sensation in toes on L subjectively, but normal sensation to monofilament testing bilaterally Psych: normal mood, affect, hygiene and grooming  Neuro: alert and oriented. Normal gait, cranial nerves grossly intact Skin: ecchymoses on hands/forearms.  ASSESSMENT/PLAN:  Essential hypertension, benign - well controlled; shouldn't be on both ARB and ACEI--stop lisinopril and monitor BP's.  consider further changes to lower coreg (and titrate up amlodipine prn) - Plan: Comprehensive metabolic panel  Type II or unspecified type diabetes mellitus without mention of complication, not stated as uncontrolled - diet controlled.  encouraged further weight loss, continue exercise - Plan: HgB A1c, HM Diabetes Foot Exam  Pure hypercholesterolemia - Plan: Lipid  panel  Peripheral neuropathy - controlled  Gout - controlled. change to 100mg  daily once she finishes up 300 (1/2 tab) tablets - Plan: Uric acid  Pure hypercholesterolemia - continue lowfat, low cholesterol diet. - Plan: Lipid panel  PAD (peripheral artery disease) - diminished pulses.  only mildly abnormal ABI   D/c lisinopril. Continue current dose of losartan Continue current dose of amlodipine and coreg. Call with home blood pressure readings in 1 month, checking BP once or twice a week. (vs dropping by list of BP's) ?consider lowering dose of coreg, but may need to increase amlodipine dose.  Will wait to make these decision after 1 month, seeing her BP readings after stopping the lisinopril  Reviewed meds--pt states no refills needed.  Likely only the potassium is the one nearest to needing refill.  F/u 6 months

## 2013-07-20 ENCOUNTER — Encounter: Payer: Self-pay | Admitting: Family Medicine

## 2013-07-27 ENCOUNTER — Other Ambulatory Visit: Payer: Self-pay | Admitting: Family Medicine

## 2013-08-15 ENCOUNTER — Other Ambulatory Visit: Payer: Self-pay | Admitting: Family Medicine

## 2013-09-11 ENCOUNTER — Other Ambulatory Visit: Payer: Self-pay | Admitting: Family Medicine

## 2013-09-20 ENCOUNTER — Encounter: Payer: Self-pay | Admitting: Internal Medicine

## 2013-09-20 LAB — HM DIABETES EYE EXAM

## 2013-10-03 ENCOUNTER — Other Ambulatory Visit: Payer: Self-pay | Admitting: Family Medicine

## 2013-10-13 ENCOUNTER — Other Ambulatory Visit: Payer: Self-pay | Admitting: Family Medicine

## 2013-10-21 ENCOUNTER — Other Ambulatory Visit: Payer: Self-pay | Admitting: Family Medicine

## 2013-10-24 ENCOUNTER — Encounter (INDEPENDENT_AMBULATORY_CARE_PROVIDER_SITE_OTHER): Payer: Self-pay

## 2013-10-24 ENCOUNTER — Ambulatory Visit (INDEPENDENT_AMBULATORY_CARE_PROVIDER_SITE_OTHER): Payer: Medicare Other | Admitting: Neurology

## 2013-10-24 ENCOUNTER — Encounter: Payer: Self-pay | Admitting: Neurology

## 2013-10-24 VITALS — BP 141/68 | HR 89 | Wt 218.0 lb

## 2013-10-24 DIAGNOSIS — Z8673 Personal history of transient ischemic attack (TIA), and cerebral infarction without residual deficits: Secondary | ICD-10-CM

## 2013-10-24 HISTORY — DX: Personal history of transient ischemic attack (TIA), and cerebral infarction without residual deficits: Z86.73

## 2013-10-24 MED ORDER — CLOPIDOGREL BISULFATE 75 MG PO TABS: 75.0000 mg | ORAL_TABLET | Freq: Every day | ORAL | Status: DC

## 2013-10-24 NOTE — Progress Notes (Signed)
Reason for visit: History of cerebrovascular disease  Kristin Willis is an 80 y.o. female  History of present illness:  Kristin Willis this and 80 year old right-handed white female with a history of cerebrovascular disease. The patient has some residual left foot numbness following a small stroke several years ago. The patient remains on Plavix, and she is tolerating this fairly well. The patient bruises easily on the medication. The patient has significant knee arthritis, and she has had a left total knee replacement. The patient has difficulty getting out of a chair. The patient walks with a cane. The patient denies any recent falls. Carotid Doppler studies done within the last 10 months showed mild bilateral carotid stenosis.  Past Medical History  Diagnosis Date  . Incontinence   . Wears glasses   . Arthritis     right knee, left wrist  . Diabetes mellitus     diet controlled  . Hypertension   . Asthma     related to allergies when working; resolved  . Obesity, unspecified   . Dyslipidemia   . Gout   . DJD (degenerative joint disease)   . Diabetic retinopathy   . Membranous glomerulonephropathy     [name]  . Osteopenia L hip, 4/06  . DDD (degenerative disc disease), lumbar 4/06  . Sliding hiatal hernia small sliding hiatal hernia  . Hepatitis A history(in [place]-1950's)  . Mini stroke Kristin Willis  . Colon polyps     4/09--adenomatous. 04/2010--normal.  Repeat 2014  . Endometrial cancer hx  . Breast cancer 7/08 Inrasine ductal L breast  . Skin cancer     [name]  . History of stroke 10/24/2013    Past Surgical History  Procedure Laterality Date  . Breast lumpectomy      left  . Abdominal hysterectomy      endometrial cancer  . Kidney stones    . Left middle finger    . Skin cancer excision    . Total knee arthroplasty      left  . Cataract extraction    . Breast lumpectomy  8/08 L Breast ([name])  . Colonoscopy  8/08 [name] (normal)  . Replacement  total knee  Left  . Appendectomy      Family History  Problem Relation Age of Onset  . Heart attack Father   . Diabetes Father   . Heart attack Mother   . Hypertension Mother   . Diabetes Mother   . Hypertension Brother   . Melanoma Brother   . Stroke Brother     Social history:  reports that she has never smoked. She has never used smokeless tobacco. She reports that she does not drink alcohol or use illicit drugs.    Allergies  Allergen Reactions  . Codeine Nausea And Vomiting  . Contrast Media [Iodinated Diagnostic Agents] Nausea And Vomiting  . Morphine And Related Nausea And Vomiting  . Statins Other (See Comments)    lethargic  . Toprol Xl [Metoprolol Tartrate]     Symptoms of heart attack, per pt, told not to take it again (?chest pain)  . Clindamycin/Lincomycin Itching and Rash  . Penicillins Hives, Swelling and Rash    Medications:  Current Outpatient Prescriptions on File Prior to Visit  Medication Sig Dispense Refill  . acetaminophen (TYLENOL) 500 MG tablet Take 1,000 mg by mouth 2 (two) times daily.        Marland Kitchen allopurinol (ZYLOPRIM) 300 MG tablet Take 150 mg by mouth daily.      Marland Kitchen  amLODipine (NORVASC) 5 MG tablet TAKE 1 TABLET BY MOUTH EVERY DAY  30 tablet  4  . B Complex-C (SUPER B COMPLEX PO) Take 1 capsule by mouth daily.        . Calcium Carbonate-Vitamin D (CALTRATE 600+D PO) Take 2 tablets by mouth daily.      . carvedilol (COREG) 25 MG tablet TAKE 1 & 1/2 TABLETS BY MOUTH TWICE A DAY  45 tablet  4  . Cholecalciferol (VITAMIN D) 2000 UNITS tablet Take 2,000 Units by mouth daily.      . Coenzyme Q10 (CO Q 10 PO) Take 1 tablet by mouth daily.       . Evening Primrose Oil CAPS Take 1 capsule by mouth daily.        Marland Kitchen gabapentin (NEURONTIN) 300 MG capsule TAKE ONE CAPSULE BY MOUTH AT BEDTIME  30 capsule  11  . glucose blood test strip Use as instructed  100 each  0  . hydrochlorothiazide (,MICROZIDE/HYDRODIURIL,) 12.5 MG capsule Take 12.5 mg by mouth daily.         Marland Kitchen ibandronate (BONIVA) 150 MG tablet TAKE 1 TAB EVERY 30 DAYS EVERY MORNING WITH H20 ON EMPTY STOMACH. NO FOOD/LAY DOWN FOR 30 MIN  1 tablet  5  . Krill Oil 500 MG CAPS Take 1 capsule by mouth daily.      Marland Kitchen losartan (COZAAR) 100 MG tablet TAKE 1 TABLET BY MOUTH TWICE A DAY  60 tablet  4  . Misc Natural Products (OSTEO BI-FLEX ADV TRIPLE ST PO) Take 2 capsules by mouth daily.      . Multiple Vitamin (MULTIVITAMIN) tablet Take 1 tablet by mouth daily.      . Needles & Syringes MISC 1 each by Does not apply route once. Pt uses freestyles lancets  100 each  0  . potassium chloride (K-DUR) 10 MEQ tablet TAKE 1 TABLET BY MOUTH EVERY DAY  30 tablet  5  . solifenacin (VESICARE) 5 MG tablet Take 5 mg by mouth daily.       Marland Kitchen Specialty Vitamins Products ([employer] HAIR/SKIN/NAILS PO) Take 2 each by mouth daily.      Marland Kitchen ZETIA 10 MG tablet TAKE 1 TABLET BY MOUTH EVERY DAY  30 tablet  1   No current facility-administered medications on file prior to visit.    ROS:  Out of a complete 14 system review of symptoms, the patient complains only of the following symptoms, and all other reviewed systems are negative.  Incontinence of bladder, frequency of urination Joint pain, walking difficulty Bruising easily Left foot numbness  Blood pressure 141/68, pulse 89, weight 218 lb (98.884 kg).  Physical Exam  General: The patient is alert and cooperative at the time of the examination.  Skin: 2+ edema below the knees is noted bilaterally.   Neurologic Exam  Mental status: The patient is oriented x 3.  Cranial nerves: Facial symmetry is present. Speech is normal, no aphasia or dysarthria is noted. Extraocular movements are full. Visual fields are full.  Motor: The patient has good strength in all 4 extremities.  Sensory examination: Soft touch sensation is symmetric on the face, arms, and legs.  Coordination: The patient has good finger-nose-finger and heel-to-shin bilaterally.  Gait and station: The  patient has a slightly wide-based, unsteady gait. The patient walks with a cane. Tandem gait is unsteady. Romberg is negative. No drift is seen.  Reflexes: Deep tendon reflexes are symmetric.   Assessment/Plan:  One. Cerebrovascular disease  2. Gait  disturbance  3. Degenerative arthritis  The patient will continue on Plavix for now. A prescription was written today. The patient will followup in one year.  Jill Alexanders MD 10/24/2013 2:27 PM  [facility] [address] [place], [place] [address]  Phone [phone] Fax [phone]

## 2013-10-24 NOTE — Patient Instructions (Signed)

## 2013-11-06 ENCOUNTER — Telehealth: Payer: Self-pay | Admitting: Family Medicine

## 2013-11-07 NOTE — Telephone Encounter (Signed)
P.A. Losartan denied, only covers #1 tablet a day.  Dr. Tomi Bamberger see approval criteria on form on your desk.

## 2013-11-07 NOTE — Telephone Encounter (Signed)
Pt's chart was reviewed.  She has been on BID dosing of this med since 2011. She actually was recently ALSO on ACEI, and that was stopped.  Since stopping the lisinopril, her BP is borderline.  I do not want to cut her losartan dose in 1/2, or else her BP won't be controlled, and we will have to make significantly more med adjustments.  She is also followed by [facility], who knows that she is on this dose and is fine with it.  If there is no way to get it covered, can she look into what the out of pocket cost would be for the add'l quantity?  Only other option would be to decrease dose to 100mg  once daily (what they pay for), monitor BP's regularly, and return in 3-4 weeks for OV where we could make further med adjustments if BP's are running high.  Let me know if you can think of other options.

## 2013-11-09 ENCOUNTER — Other Ambulatory Visit: Payer: Self-pay | Admitting: Family Medicine

## 2013-11-20 ENCOUNTER — Other Ambulatory Visit: Payer: Self-pay | Admitting: Neurology

## 2013-11-21 NOTE — Telephone Encounter (Signed)
[employer] Medicare complete [id number] [facility] Medicare Advantage [id number]  Not sure which is her primary insurance.  Will try calling these numbers tomorrow to appeal by phone.  Ref: [id number]

## 2013-11-21 NOTE — Telephone Encounter (Signed)
Dr. Tomi Bamberger, if you want to write a letter of appeal, stating why BID dosing is appropriate for this pt, then I will send in an appeal and see if insurance will cover it.  Chart on your desk

## 2013-11-22 NOTE — Telephone Encounter (Signed)
[phone] ([facility] pharmacy benefit)--said they don't handle this and gave me number to appeal Appeal department [phone]--fax number, not a phone number   [phone] ([facility])--this person was more helpful, checked with other people after only being able to see medical claims on her end, not the denial for the losartan.  She said we need to send info to appeals dept--no phone number found to speak with anyone in appeals dept. After speaking with another supervisor, she said they don't really handle this at all, should be [facility].  [facility] earlier had told me that because it is a part D claim, that they DON'T handle it, that [facility] does.  They are giving me completely contradicting information about who takes care of the appeal.  So after wasting about an hour on the phone, talking to at least 5 different people telling me completely different things, I guess we need to write a letter.  I thought it would be easier on my day off to simply speak with a person to get this taken care of, but clearly, after an hour of my time wasted, I was wrong.  Please have someone write a letter to appeal the decision as written below, and fax to the numbers on the denial letter. The letter is still on the patient's chart, on my desk.  I am writing to appeal the decision to limit the quantity of losartan to #30 per month for this patient.  She has been on twice daily dosing for over 4 years. Until recently, she had also been on an ACE inhibitor in addition to this ARB.  This was felt to not be a good choice medically for her, so the lisinopril was stopped.  Her blood pressure increased some since the other medication was stopped, and is currently borderline.  I feel that if we further cut back on her blood pressure medication (by changing the losartan to 100 mg just once daily instead of twice daily), her blood pressure will not be under good control, increasing her risk of cardiovascular complications.  She is also on  amlodipine, carvedilol and HCTZ to help control her blood pressure.  She has h/o underlying kidney disease, and is under the care of a nephrologist at [facility], who has no concerns with her remaining on this medication at this dose.  Please reconsider the quantity limit to allow this 80 year old woman, on a very complex medical regimen, to remain on the regimen that has been effective for her.  Thanks 

## 2013-11-23 NOTE — Telephone Encounter (Signed)
[phone] ([facility] pharmacy benefit)--said they don't handle this and gave me number to appeal Appeal department [phone] number, not a phone number   [phone] ([facility])--this person was more helpful, checked with other people after only being able to see medical claims on her end, not the denial for the losartan.  She said we need to send info to appeals dept--no phone number found to speak with anyone in appeals dept. After speaking with another supervisor, she said they don't really handle this at all, should be [facility].  [facility] earlier had told me that because it is a part D claim, that they DON'T handle it, that [facility] does.  They are giving me completely contradicting information about who takes care of the appeal.  So after wasting about an hour on the phone, talking to at least 5 different people telling me completely different things, I guess we need to write a letter.  I thought it would be easier on my day off to simply speak with a person to get this taken care of, but clearly, after an hour of my time wasted, I was wrong.  Please have someone write a letter to appeal the decision as written below, and fax to the numbers on the denial letter. The letter is still on the patient's chart, on my desk.  I am writing to appeal the decision to limit the quantity of losartan to #30 per month for this patient.  She has been on twice daily dosing for over 4 years. Until recently, she had also been on an ACE inhibitor in addition to this ARB.  This was felt to not be a good choice medically for her, so the lisinopril was stopped.  Her blood pressure increased some since the other medication was stopped, and is currently borderline.  I feel that if we further cut back on her blood pressure medication (by changing the losartan to 100 mg just once daily instead of twice daily), her blood pressure will not be under good control, increasing her risk of cardiovascular complications.  She is also on  amlodipine, carvedilol and HCTZ to help control her blood pressure.  She has h/o underlying kidney disease, and is under the care of a nephrologist at [facility], who has no concerns with her remaining on this medication at this dose.  Please reconsider the quantity limit to allow this 80 year old woman, on a very complex medical regimen, to remain on the regimen that has been effective for her.  Thanks

## 2013-11-24 NOTE — Telephone Encounter (Signed)
Appeal letter faxed.

## 2013-12-05 ENCOUNTER — Other Ambulatory Visit: Payer: Self-pay | Admitting: Family Medicine

## 2013-12-09 NOTE — Telephone Encounter (Signed)
P.A. LOSARTAN (2 A DAY) approved til 09/14/14, left message for pt

## 2013-12-12 ENCOUNTER — Other Ambulatory Visit: Payer: Self-pay | Admitting: Family Medicine

## 2013-12-27 ENCOUNTER — Other Ambulatory Visit: Payer: Self-pay | Admitting: Family Medicine

## 2014-01-01 ENCOUNTER — Other Ambulatory Visit: Payer: Self-pay | Admitting: Family Medicine

## 2014-01-09 ENCOUNTER — Encounter: Payer: Self-pay | Admitting: Family Medicine

## 2014-01-09 ENCOUNTER — Ambulatory Visit (INDEPENDENT_AMBULATORY_CARE_PROVIDER_SITE_OTHER): Payer: Medicare Other | Admitting: Family Medicine

## 2014-01-09 ENCOUNTER — Other Ambulatory Visit: Payer: Self-pay | Admitting: Family Medicine

## 2014-01-09 VITALS — BP 140/80 | HR 72 | Ht 67.0 in | Wt 216.0 lb

## 2014-01-09 DIAGNOSIS — E119 Type 2 diabetes mellitus without complications: Secondary | ICD-10-CM

## 2014-01-09 DIAGNOSIS — C50919 Malignant neoplasm of unspecified site of unspecified female breast: Secondary | ICD-10-CM

## 2014-01-09 DIAGNOSIS — N3941 Urge incontinence: Secondary | ICD-10-CM

## 2014-01-09 DIAGNOSIS — M810 Age-related osteoporosis without current pathological fracture: Secondary | ICD-10-CM

## 2014-01-09 DIAGNOSIS — Z23 Encounter for immunization: Secondary | ICD-10-CM

## 2014-01-09 DIAGNOSIS — E7800 Pure hypercholesterolemia, unspecified: Secondary | ICD-10-CM

## 2014-01-09 DIAGNOSIS — M109 Gout, unspecified: Secondary | ICD-10-CM

## 2014-01-09 DIAGNOSIS — Z8673 Personal history of transient ischemic attack (TIA), and cerebral infarction without residual deficits: Secondary | ICD-10-CM

## 2014-01-09 DIAGNOSIS — Z79899 Other long term (current) drug therapy: Secondary | ICD-10-CM

## 2014-01-09 DIAGNOSIS — I739 Peripheral vascular disease, unspecified: Secondary | ICD-10-CM

## 2014-01-09 DIAGNOSIS — M8580 Other specified disorders of bone density and structure, unspecified site: Secondary | ICD-10-CM

## 2014-01-09 DIAGNOSIS — M949 Disorder of cartilage, unspecified: Secondary | ICD-10-CM

## 2014-01-09 DIAGNOSIS — M899 Disorder of bone, unspecified: Secondary | ICD-10-CM

## 2014-01-09 DIAGNOSIS — I10 Essential (primary) hypertension: Secondary | ICD-10-CM

## 2014-01-09 LAB — COMPREHENSIVE METABOLIC PANEL
ALBUMIN: 3.7 g/dL (ref 3.5–5.2)
ALT: 12 U/L (ref 0–35)
AST: 16 U/L (ref 0–37)
Alkaline Phosphatase: 39 U/L (ref 39–117)
BUN: 26 mg/dL — ABNORMAL HIGH (ref 6–23)
CALCIUM: 9.7 mg/dL (ref 8.4–10.5)
CHLORIDE: 104 meq/L (ref 96–112)
CO2: 30 mEq/L (ref 19–32)
CREATININE: 0.83 mg/dL (ref 0.50–1.10)
GLUCOSE: 120 mg/dL — AB (ref 70–99)
POTASSIUM: 4.1 meq/L (ref 3.5–5.3)
Sodium: 142 mEq/L (ref 135–145)
Total Bilirubin: 0.6 mg/dL (ref 0.2–1.2)
Total Protein: 6.2 g/dL (ref 6.0–8.3)

## 2014-01-09 LAB — LIPID PANEL
CHOL/HDL RATIO: 3.9 ratio
CHOLESTEROL: 133 mg/dL (ref 0–200)
HDL: 34 mg/dL — AB (ref >39)
LDL Cholesterol: 47 mg/dL (ref 0–99)
Triglycerides: 260 mg/dL — ABNORMAL HIGH (ref <150)
VLDL: 52 mg/dL — ABNORMAL HIGH (ref 0–40)

## 2014-01-09 LAB — CBC WITH DIFFERENTIAL/PLATELET
BASOS PCT: 1 % (ref 0–1)
Basophils Absolute: 0.1 10*3/uL (ref 0.0–0.1)
Eosinophils Absolute: 0.4 10*3/uL (ref 0.0–0.7)
Eosinophils Relative: 6 % — ABNORMAL HIGH (ref 0–5)
HEMATOCRIT: 40.9 % (ref 36.0–46.0)
Hemoglobin: 13.7 g/dL (ref 12.0–15.0)
LYMPHS ABS: 2.1 10*3/uL (ref 0.7–4.0)
LYMPHS PCT: 36 % (ref 12–46)
MCH: 31.1 pg (ref 26.0–34.0)
MCHC: 33.5 g/dL (ref 30.0–36.0)
MCV: 93 fL (ref 78.0–100.0)
MONO ABS: 0.8 10*3/uL (ref 0.1–1.0)
MONOS PCT: 13 % — AB (ref 3–12)
NEUTROS PCT: 44 % (ref 43–77)
Neutro Abs: 2.6 10*3/uL (ref 1.7–7.7)
Platelets: 171 10*3/uL (ref 150–400)
RBC: 4.4 MIL/uL (ref 3.87–5.11)
RDW: 15.6 % — ABNORMAL HIGH (ref 11.5–15.5)
WBC: 5.9 10*3/uL (ref 4.0–10.5)

## 2014-01-09 LAB — POCT GLYCOSYLATED HEMOGLOBIN (HGB A1C): Hemoglobin A1C: 5.8

## 2014-01-09 LAB — URIC ACID: URIC ACID, SERUM: 3.8 mg/dL (ref 2.4–7.0)

## 2014-01-09 NOTE — Patient Instructions (Signed)
Continue your current medications.  The new allopurinol dose (100mg ) should be the FULL tablet that you take, no longer cutting it in half.  Let us know if you haven't heard from [facility] to schedule your bone density test.

## 2014-01-09 NOTE — Progress Notes (Signed)
Chief Complaint  Patient presents with  . Diabetes    fasting med check, also has form from [facility] to be filled out with her today. New rx was sent in for her for allopurinol-as taking 152m daily, new rx was for 1048mso she is confused. [facility] [place] [facility] 2nd [facility] shot, urine for microalbumin, A1C, eye exam and foot exam. Wants to know if her taking Plavix is similiar to taking warfarin-should she avoid leafy vegetables.     Patient presents for med check and labs.    Hypertension:  She denies dizziness, headaches, chest pain. Denies side effects of medications. Her lisinopril was stopped after her last visit, and we have been fighting to get her BID dosing of losartan covered. She continues to take Coreg, amlodipine, HCTZ (and potassium). She checks BP's periodically, didn't bring in list.  Recalls them running  120-130/70's. 904/48hen [place] [facility] [place] (asymptomatic). She ultimately was able to get her losartan for $3 (insurance wasn't covering the BID) Hasn't taken her coreg yet this morning--getting from pharmacy today  DM--diet controlled, never on meds. Her last diabetic eye exam was 09/2013; no evidence of retinopathy. Sugars are running 90-130. Denies polydipsia (she has dry mouth from her medications), polyuria, vision changes, hypoglycemia, numbness/tingling.   She lost weight with nutrisystem last year. She had maintained her weight loss for a while, but has gained 8 pounds in the last 6 months. She stopped doing water aerobics with the colder weather, but plans to restart.   H/o TIA: She sees Dr. WiJannifer Franklinwho has her on Plavix (last seen in 10/2013). She is complaining of easy bruising (which isn't new). Denies any bleeding.  She is asking if she needs to avoid foods rich in vitamin K, like with warfarin.  Gout--she only changed from the 30056mablets to the 100m15mblets in the last couple of weeks.  She didn't recall  how to take, and has only been taking 1/2 of the 100mg9mhe previously was taking 1/2 of the 300mg.76mnies any joint swelling or gout flare (not for years).  She had some hair loss from the letrozole. She started taking selenium, hair/skin/nail vitamin.  She spoke with oncologist, and they told her she took it long enough, and was able to stop the letrozole (prior to her last visit here).  She has had improvement in her hair, it is growing back. She sees Dr. MagrinJana Hakimarly.  Urge incontinence: She continues to get the neoplasty (needle in the foot) through urology, which has been helpful in controlling her urinary symptoms.  Osteoporosis/osteopenia: last DEXA 02/2011 (ordered by Dr. MagrinJana Hakimst T -1.1 L femur, -1 at L forearm.    Membranous glomerulonephropathy--she sees Dr. DunhamLorrene Reids reportedly in remission.  Past Medical History  Diagnosis Date  . Incontinence   . Wears glasses   . Arthritis     right knee, left wrist  . Diabetes mellitus     diet controlled  . Hypertension   . Asthma     related to allergies when working; resolved  . Obesity, unspecified   . Dyslipidemia   . Gout   . DJD (degenerative joint disease)   . Diabetic retinopathy   . Membranous glomerulonephropathy     Dr. DunhamLorrene Reidteopenia L hip, 4/06  . DDD (degenerative disc disease), lumbar 4/06  . Sliding hiatal hernia small sliding hiatal hernia  .  Hepatitis A history(in [place]-1950's)  . Mini stroke DrWillis  . Colon polyps     4/09--adenomatous. 04/2010--normal.  Repeat 2014  . Endometrial cancer hx  . Breast cancer 7/08 Inrasine ductal L breast  . Skin cancer     [name]  . History of stroke 10/24/2013   Past Surgical History  Procedure Laterality Date  . Breast lumpectomy      left  . Abdominal hysterectomy      endometrial cancer  . Kidney stones    . Left middle finger    . Skin cancer excision    . Total knee arthroplasty      left  . Cataract extraction    . Breast  lumpectomy  8/08 L Breast ([name])  . Colonoscopy  8/08 [name] (normal)  . Replacement total knee  Left  . Appendectomy     History   Social History  . Marital Status: Married    Spouse Name: N/A    Number of Children: N/A  . Years of Education: N/A   Occupational History  . Not on file.   Social History Main Topics  . Smoking status: Never Smoker   . Smokeless tobacco: Never Used  . Alcohol Use: No  . Drug Use: No  . Sexual Activity: No   Other Topics Concern  . Not on file   Social History Narrative   Lives alone.  Daughter lives in [place], daughter lives in [place], son in [place], [place].  Widowed (husband was in [place])   Outpatient Encounter Prescriptions as of 01/09/2014  Medication Sig Note  . acetaminophen (TYLENOL) 500 MG tablet Take 1,000 mg by mouth 2 (two) times daily.     Marland Kitchen allopurinol (ZYLOPRIM) 300 MG tablet Take 150 mg by mouth daily.   Marland Kitchen amLODipine (NORVASC) 5 MG tablet TAKE 1 TABLET BY MOUTH EVERY DAY 01/09/2014: She got 125m tablets about a week ago, and has been cutting them in 1/2 (previously taking 150/day, half of 3059m  . B Complex-C (SUPER B COMPLEX PO) Take 1 capsule by mouth daily.     . Calcium Carbonate-Vitamin D (CALTRATE 600+D PO) Take 2 tablets by mouth daily.   . Cholecalciferol (VITAMIN D) 2000 UNITS tablet Take 2,000 Units by mouth daily.   . clopidogrel (PLAVIX) 75 MG tablet TAKE 1 TABLET BY MOUTH EVERY DAY   . Coenzyme Q10 (CO Q 10 PO) Take 1 tablet by mouth daily.    . Evening Primrose Oil CAPS Take 1 capsule by mouth daily.     . Marland Kitchenabapentin (NEURONTIN) 300 MG capsule TAKE ONE CAPSULE BY MOUTH AT BEDTIME   . glucose blood test strip Use as instructed   . hydrochlorothiazide (,MICROZIDE/HYDRODIURIL,) 12.5 MG capsule Take 12.5 mg by mouth daily.     . Marland Kitchenbandronate (BONIVA) 150 MG tablet TAKE 1 TAB EVERY 30 DAYS EVERY MORNING WITH H20 ON EMPTY STOMACH. NO FOOD/LAY DOWN FOR 30 MIN   . Krill Oil 500 MG CAPS Take 1 capsule  by mouth daily. 07/11/2013: Takes about 3 times a week.   . losartan (COZAAR) 100 MG tablet TAKE 1 TABLET BY MOUTH TWICE A DAY   . Misc Natural Products (OSTEO BI-FLEX ADV TRIPLE ST PO) Take 2 capsules by mouth daily.   . Multiple Vitamin (MULTIVITAMIN) tablet Take 1 tablet by mouth daily. 04/18/2013: Takes a vitamin pack for diabetics  . Needles & Syringes MISC 1 each by Does not apply route once. Pt uses freestyles lancets   .  potassium chloride (K-DUR) 10 MEQ tablet TAKE 1 TABLET BY MOUTH EVERY DAY   . Selenium 200 MCG TABS Take 1 tablet by mouth daily. 01/09/2014: Taking this to get her hair/skin/nail vitamin better absorbed  . solifenacin (VESICARE) 5 MG tablet Take 5 mg by mouth daily.    Marland Kitchen Specialty Vitamins Products ([employer] HAIR/SKIN/NAILS PO) Take 2 each by mouth daily.   Marland Kitchen ZETIA 10 MG tablet TAKE 1 TABLET BY MOUTH EVERY DAY   . [DISCONTINUED] KLOR-CON M10 10 MEQ tablet Take 10 mEq by mouth daily. 10/24/2013: Received from: External Pharmacy Received Sig:   . [DISCONTINUED] carvedilol (COREG) 25 MG tablet TAKE 1 & 1/2 TABLETS BY MOUTH TWICE A DAY 01/09/2014: Ran out yesterday, has refill at pharmacy, hasn't taken yet today   Allergies  Allergen Reactions  . Codeine Nausea And Vomiting  . Contrast Media [Iodinated Diagnostic Agents] Nausea And Vomiting  . Morphine And Related Nausea And Vomiting  . Statins Other (See Comments)    lethargic  . Toprol Xl [Metoprolol Tartrate]     Symptoms of heart attack, per pt, told not to take it again (?chest pain)  . Clindamycin/Lincomycin Itching and Rash  . Penicillins Hives, Swelling and Rash   ROS:  Denies headaches, dizziness, URI or allergy symptoms, cough, shortness of breath, chest pain, palpitations, nausea, vomiting, bowel changes.  Denies dysuria, hematuria.  Incontinence controlled with meds and needle therapy per urologist.  Denies depression, memory concerns, bleeding/bruising, rash, falls, joint pains or other problems.  PHYSICAL EXAM: BP  140/80  Pulse 72  Ht 5' 7"  (1.702 m)  Wt 216 lb (97.977 kg)  BMI 33.82 kg/m2  Well developed, pleasant elderly female, appearing younger than stated age.  HEENT: PERRL conjunctiva clear, EOMI, OP clear Neck: no lymphadenopathy, thyromegaly or carotid bruit  Heart: regular rate and rhythm  Lungs: clear bilaterally  Back: no CVA tenderness, spine tenderness Abdomen: soft, nontender, no mass or oganomegaly Extremities: Palpable DP pulses bilaterally, but faint.  Diminished PT bilaterally. Brisk cap refill, feet are warm.  Trace edema on the left.  Normal sensation to monofilament testing bilaterally  Psych: normal mood, affect, hygiene and grooming  Neuro: alert and oriented x 3. Normal gait, cranial nerves grossly intact Normal cognitive function.  Alert and oriented. Skin:   Dry skin on lower legs.  Some bruises on forearms. Psych: normal mood, affect, hygiene and grooming.  Negative depression screen.  Lab Results  Component Value Date   HGBA1C 5.8 01/09/2014    ASSESSMENT/PLAN:  Type II or unspecified type diabetes mellitus without mention of complication, not stated as uncontrolled - diet-controlled.  daily exercise and weight loss encouraged. cont yearly eye exams - Plan: HgB A1c, Comprehensive metabolic panel, TSH, Microalbumin / creatinine urine ratio, HM Diabetes Foot Exam  Pure hypercholesterolemia - Plan: Lipid panel  Essential hypertension, benign - controlled per home #'s, borderline here.  continue to monitor, low sodium diet; daily exercie and wt loss encouraged - Plan: Comprehensive metabolic panel  PAD (peripheral artery disease) - stable, asymptomatic.  continue on plavix  History of stroke  Encounter for long-term (current) use of other medications - Plan: Lipid panel, Comprehensive metabolic panel, CBC with Differential, Microalbumin / creatinine urine ratio, Uric Acid  Gout - no flares in years.  allopurinol dose was decreased from 150 to 100 to avoid cutting  pills (pt in error taking 15m--will change back to 1064m - Plan: Uric Acid  Osteoporosis, unspecified - Plan: DG Bone Density  Need for  prophylactic vaccination against Streptococcus pneumoniae (pneumococcus) - Plan: Pneumococcal conjugate vaccine 13-valent  Osteopenia - due for Dexa.  On Boniva-- Consider stopping, after review of DEXA--discuss with Dr. Jana Hakim prior to stopping.  no longer on letrozole  Breast cancer - mammo UTD, last 04/2013; under care of Dr. Jana Hakim  Urge incontinence  PAD--stable on plavix, continue; asymptomatic TIA's--no further.  Sees Dr. Jannifer Franklin regularly.  Continue plavix and ensure BP and lipids are at goal Restart water aerobics; further weight loss encouraged.  Osteoporosis--last dexa showed significant improvement.  She is no longer on letrozole.  Repeat DEXA and consider stopping bisphosphonate   c-met, lipid, CBC, TSH, urine microalb, uric acid  F/u 6 mos, sooner prn

## 2014-01-10 ENCOUNTER — Encounter: Payer: Self-pay | Admitting: Family Medicine

## 2014-01-10 DIAGNOSIS — R809 Proteinuria, unspecified: Secondary | ICD-10-CM | POA: Insufficient documentation

## 2014-01-10 DIAGNOSIS — N3941 Urge incontinence: Secondary | ICD-10-CM | POA: Insufficient documentation

## 2014-01-10 LAB — MICROALBUMIN / CREATININE URINE RATIO
CREATININE, URINE: 85.2 mg/dL
Microalb Creat Ratio: 48 mg/g — ABNORMAL HIGH (ref 0.0–30.0)
Microalb, Ur: 4.09 mg/dL — ABNORMAL HIGH (ref 0.00–1.89)

## 2014-01-10 LAB — TSH: TSH: 0.893 u[IU]/mL (ref 0.350–4.500)

## 2014-01-23 ENCOUNTER — Other Ambulatory Visit: Payer: Self-pay | Admitting: Family Medicine

## 2014-01-27 ENCOUNTER — Other Ambulatory Visit: Payer: Self-pay | Admitting: Family Medicine

## 2014-01-30 ENCOUNTER — Other Ambulatory Visit: Payer: Self-pay | Admitting: Family Medicine

## 2014-02-01 ENCOUNTER — Other Ambulatory Visit: Payer: Self-pay | Admitting: Gastroenterology

## 2014-02-01 LAB — HM COLONOSCOPY

## 2014-02-10 ENCOUNTER — Other Ambulatory Visit: Payer: Self-pay | Admitting: Family Medicine

## 2014-02-13 ENCOUNTER — Telehealth: Payer: Self-pay | Admitting: *Deleted

## 2014-02-13 NOTE — Telephone Encounter (Signed)
BP's are ranging from 108-174/62-86, averaging 130's/70's.  Blood sugars are 122-164, average approx 140 (these were fasting).    Advise pt that BP's are fine, but fasting blood sugars seem to be running higher than in the past.  Last A1c was 5.8 in end of April, but I suspect it might be creeping up. Have her make sure to be limiting her sweets/ sugars (in candy, tea, juices, sodas), limit fruit intake to just 2 servings daily, as well as limiting her carbs and trying to get some daily exercise and lose some of the weight that she had regained.  If sugars remain mostly >140 fasting (as they have been), then I'd prefer her to come the end of July for f/u on DM with A1c to be done at visit (ensure 3 mos from last visit)

## 2014-02-13 NOTE — Telephone Encounter (Signed)
Patient mailed paper with her bp readings. I will place on your shelf. Ranges 108-169/62-86. For your review.

## 2014-02-16 NOTE — Telephone Encounter (Signed)
Left message for patient to call me back. 

## 2014-02-17 ENCOUNTER — Encounter: Payer: Self-pay | Admitting: Family Medicine

## 2014-02-22 NOTE — Telephone Encounter (Signed)
Spoke with patient and scheduled her for 04/26/14, she was out of town the last week of July, and Dr.Knapp is out the first week of August.

## 2014-02-26 ENCOUNTER — Other Ambulatory Visit: Payer: Self-pay | Admitting: Family Medicine

## 2014-03-01 ENCOUNTER — Other Ambulatory Visit: Payer: Self-pay | Admitting: Family Medicine

## 2014-03-24 ENCOUNTER — Telehealth: Payer: Self-pay | Admitting: Oncology

## 2014-03-24 NOTE — Telephone Encounter (Signed)
per GM to r/s pt-will call pt to adv of new time & date

## 2014-03-30 ENCOUNTER — Telehealth: Payer: Self-pay | Admitting: Oncology

## 2014-03-30 NOTE — Telephone Encounter (Signed)
cld pt and adv of r/s time & date of appt-will mail appt to pt

## 2014-03-31 ENCOUNTER — Other Ambulatory Visit: Payer: Self-pay | Admitting: Family Medicine

## 2014-04-03 ENCOUNTER — Other Ambulatory Visit: Payer: Self-pay | Admitting: Family Medicine

## 2014-04-18 ENCOUNTER — Other Ambulatory Visit: Payer: Self-pay | Admitting: Family Medicine

## 2014-04-18 DIAGNOSIS — Z853 Personal history of malignant neoplasm of breast: Secondary | ICD-10-CM

## 2014-04-20 ENCOUNTER — Other Ambulatory Visit: Payer: Medicare Other

## 2014-04-24 ENCOUNTER — Ambulatory Visit: Payer: Medicare Other | Admitting: Oncology

## 2014-04-24 ENCOUNTER — Ambulatory Visit
Admission: RE | Admit: 2014-04-24 | Discharge: 2014-04-24 | Disposition: A | Discharge status: Home / Self Care | Payer: Medicare Other | Source: Ambulatory Visit | Attending: Family Medicine | Admitting: Family Medicine

## 2014-04-24 ENCOUNTER — Other Ambulatory Visit: Payer: Medicare Other

## 2014-04-24 DIAGNOSIS — M810 Age-related osteoporosis without current pathological fracture: Secondary | ICD-10-CM

## 2014-04-26 ENCOUNTER — Ambulatory Visit (INDEPENDENT_AMBULATORY_CARE_PROVIDER_SITE_OTHER): Payer: Medicare Other | Admitting: Family Medicine

## 2014-04-26 ENCOUNTER — Encounter: Payer: Self-pay | Admitting: Family Medicine

## 2014-04-26 VITALS — BP 118/70 | HR 68 | Ht 67.0 in | Wt 216.0 lb

## 2014-04-26 DIAGNOSIS — E119 Type 2 diabetes mellitus without complications: Secondary | ICD-10-CM

## 2014-04-26 DIAGNOSIS — I10 Essential (primary) hypertension: Secondary | ICD-10-CM

## 2014-04-26 DIAGNOSIS — M949 Disorder of cartilage, unspecified: Secondary | ICD-10-CM

## 2014-04-26 DIAGNOSIS — M899 Disorder of bone, unspecified: Secondary | ICD-10-CM

## 2014-04-26 DIAGNOSIS — M8580 Other specified disorders of bone density and structure, unspecified site: Secondary | ICD-10-CM

## 2014-04-26 LAB — POCT GLYCOSYLATED HEMOGLOBIN (HGB A1C): Hemoglobin A1C: 6

## 2014-04-26 MED ORDER — GLUCOSE BLOOD VI STRP: ORAL_STRIP | Status: DC

## 2014-04-26 NOTE — Progress Notes (Signed)
Chief Complaint  Patient presents with  . Diabetes    nonfasting med check.    Patient presents today (sooner than her regular 6 month check that was due in October) for follow up on her blood sugars.  She had sent in a list of her blood sugars and blood pressures in June.  At that time, BP's were ranging from 108-174/62-86, averaging 130's/70's. Blood sugars were 122-164, average approx 140 (these were fasting).  Patient was advised at that time that BP's were fine, but fasting blood sugars seem to be running higher than in the past. Last A1c was 5.8 in end of April, but I suspected it might be creeping up.  We reinforced diet (limiting her sweets/ sugars (in candy, tea, juices, sodas), limit fruit intake to just 2 servings daily, as well as limiting her carbs and trying to get some daily exercise and lose some of the weight that she had regained). She was told that if sugars remain mostly >140 fasting (as they have been), to come the end of July for f/u on DM with A1c to be done at visit.  She admits that she hasn't been checking her sugars very regularly since then, but has made changes to her diet--she isn't eating desserts.  She hasn't lost any weight.  She goes to water aerobics and is walking some (in stores, her [place]). Her test strips have expired and needs new prescription.  She had her bone density test earlier this week.  She didn't take her Jaclyn Prime this past month (but has been taking it monthly up until then). She denies any side effects to the Boniva; no chest pain, dysphagia.  She reports that she was put on cipro by Dr. Angelica Ran for a UTI.  She has 3 doses left.  She never had symptoms, except perhaps getting up a little more frequently at night. She is scheduled for recheck next week.  Tolerating ABX without side effects.  She has been having a little tough time lately--a good friend died, her hair dresser is in a coma, another friend fell, hit his head and died.   She  has been struggling some, dealing with losses of people younger than she is. She feels she has a good support system, people to talk to.   Past Medical History  Diagnosis Date  . Incontinence   . Wears glasses   . Arthritis     right knee, left wrist  . Diabetes mellitus     diet controlled  . Hypertension   . Asthma     related to allergies when working; resolved  . Obesity, unspecified   . Dyslipidemia   . Gout   . DJD (degenerative joint disease)   . Diabetic retinopathy   . Membranous glomerulonephropathy     [name]  . Osteopenia L hip, 4/06  . DDD (degenerative disc disease), lumbar 4/06  . Sliding hiatal hernia small sliding hiatal hernia  . Hepatitis A history(in [place]-1950's)  . Mini stroke DrWillis  . Colon polyps     4/09--adenomatous. 04/2010--normal.  Repeat 2014  . Endometrial cancer hx  . Breast cancer 7/08 Inrasine ductal L breast  . Skin cancer     [name]  . History of stroke 10/24/2013   Past Surgical History  Procedure Laterality Date  . Breast lumpectomy      left  . Abdominal hysterectomy      endometrial cancer  . Kidney stones    . Left  middle finger    . Skin cancer excision    . Total knee arthroplasty      left  . Cataract extraction    . Breast lumpectomy  8/08 L Breast ([name])  . Colonoscopy  8/08 [name] (normal)  . Replacement total knee  Left  . Appendectomy     History   Social History  . Marital Status: Married    Spouse Name: N/A    Number of Children: N/A  . Years of Education: N/A   Occupational History  . Not on file.   Social History Main Topics  . Smoking status: Never Smoker   . Smokeless tobacco: Never Used  . Alcohol Use: No  . Drug Use: No  . Sexual Activity: No   Other Topics Concern  . Not on file   Social History Narrative   Lives alone.  Daughter lives in [place], daughter lives in [place], son in [place], [place].  Widowed (husband was in [place])   Outpatient Encounter  Prescriptions as of 04/26/2014  Medication Sig Note  . acetaminophen (TYLENOL) 500 MG tablet Take 1,000 mg by mouth 2 (two) times daily.     Marland Kitchen allopurinol (ZYLOPRIM) 300 MG tablet Take 150 mg by mouth daily.   Marland Kitchen amLODipine (NORVASC) 5 MG tablet TAKE 1 TABLET BY MOUTH EVERY DAY   . B Complex-C (SUPER B COMPLEX PO) Take 1 capsule by mouth daily.     . Calcium Carbonate-Vitamin D (CALTRATE 600+D PO) Take 2 tablets by mouth daily.   . carvedilol (COREG) 25 MG tablet TAKE 1 & 1/2 TABLETS BY MOUTH TWICE A DAY   . Cholecalciferol (VITAMIN D) 2000 UNITS tablet Take 2,000 Units by mouth daily.   . ciprofloxacin (CIPRO) 250 MG tablet Take 250 mg by mouth 2 (two) times daily.   . clopidogrel (PLAVIX) 75 MG tablet TAKE 1 TABLET BY MOUTH EVERY DAY   . Coenzyme Q10 (CO Q 10 PO) Take 1 tablet by mouth daily.    . Evening Primrose Oil CAPS Take 1 capsule by mouth daily.     Marland Kitchen gabapentin (NEURONTIN) 300 MG capsule TAKE ONE CAPSULE BY MOUTH AT BEDTIME   . glucose blood test strip Use as instructed   . hydrochlorothiazide (,MICROZIDE/HYDRODIURIL,) 12.5 MG capsule Take 12.5 mg by mouth daily.     Marland Kitchen ibandronate (BONIVA) 150 MG tablet TAKE 1 TAB EVERY 30 DAYS EVERY MORNING WITH H20 ON EMPTY STOMACH. NO FOOD/LAY DOWN FOR 30 MIN   . KLOR-CON M10 10 MEQ tablet TAKE 1 TABLET BY MOUTH EVERY DAY   . losartan (COZAAR) 100 MG tablet TAKE 1 TABLET BY MOUTH TWICE A DAY   . Misc Natural Products (OSTEO BI-FLEX ADV TRIPLE ST PO) Take 2 capsules by mouth daily.   . Multiple Vitamin (MULTIVITAMIN) tablet Take 1 tablet by mouth daily. 04/18/2013: Takes a vitamin pack for diabetics  . Needles & Syringes MISC 1 each by Does not apply route once. Pt uses freestyles lancets   . Omega-3 Fatty Acids (FISH OIL) 1000 MG CAPS Take 1 capsule by mouth 2 (two) times daily.   . Selenium 200 MCG TABS Take 1 tablet by mouth daily. 01/09/2014: Taking this to get her hair/skin/nail vitamin better absorbed  . solifenacin (VESICARE) 5 MG tablet Take 5  mg by mouth daily.    Marland Kitchen Specialty Vitamins Products ([employer] HAIR/SKIN/NAILS PO) Take 2 each by mouth daily.   Marland Kitchen ZETIA 10 MG tablet TAKE 1 TABLET BY MOUTH EVERY DAY   . [  DISCONTINUED] glucose blood test strip Use as instructed   . [DISCONTINUED] Krill Oil 500 MG CAPS Take 1 capsule by mouth daily. 07/11/2013: Takes about 3 times a week.    (didn't take boniva this month)  Allergies  Allergen Reactions  . Codeine Nausea And Vomiting  . Contrast Media [Iodinated Diagnostic Agents] Nausea And Vomiting  . Morphine And Related Nausea And Vomiting  . Statins Other (See Comments)    lethargic  . Toprol Xl [Metoprolol Tartrate]     Symptoms of heart attack, per pt, told not to take it again (?chest pain)  . Clindamycin/Lincomycin Itching and Rash  . Penicillins Hives, Swelling and Rash   ROS:  Denies fevers, chills, URI symptoms, headaches, dizziness, nausea, vomiting, chest pain, dysphagia, bowel changes.  Denies urinary complaints, bleeding, bruising.  Moods somewhat down related to recent losses, but overall okay.  No SI. Seeing chiro for right frozen shoulder.  PHYSICAL EXAM: BP 118/70  Pulse 68  Ht 5\' 7"  (1.702 m)  Wt 216 lb (97.977 kg)  BMI 33.82 kg/m2 Pleasant, talkative elderly female in no distress Neck: no lymphadenopathy or mass Heart: regular rate and rhythm Lungs: clear Neuro: alert and oriented  Lab Results  Component Value Date   HGBA1C 6.0 04/26/2014   ASSESSMENT/PLAN:  Type II or unspecified type diabetes mellitus without mention of complication, not stated as uncontrolled - A1c up some,but diabetes is stil controlled with diet overall.  Diet was reviewed. New test strips rx'd so she can start checking more regularly - Plan: HgB A1c  Essential hypertension, benign - well controlled  Osteopenia - mild, but statistically worse from prior exam.  No longer on Letrozole.  Probably okay to stop the Boniva (she already did), but will run by her oncologist  DM--diet  controlled.  Encouraged ongoing exercise, weight loss. Don't skip meals (she had questions about new report she heard about skipping breakfast to lose weight).  Osteopenia--DEXA results reviewed with patient; awaiting response from Dr. Jana Hakim.  I think she can stop the Boniva, but want his approval.  She hasn't taken in a month, so can hold off on filling new prescription until her f/u with him next month, if I don't hear back from him before then.  we will need to recheck bone density in 1-2 years.  UTI (vs asymptomatic bacteriuria).  Discussed symptoms with patient, as well as colonization. She will complete course of Cipro, and recheck next week as scheduled.   F/u in 3 mos (fasting--lipids need to be repeated, as well as A1c)

## 2014-04-26 NOTE — Patient Instructions (Signed)
Continue lowfat, low carb diet.  Do not skip meals.  Limit your sweets, carbs, sugar.  Try and check your blood sugars more regularly (2-3 times/week) and record.  Bring your list to your next visit.  Hold off on taking the Boniva for now.  We need to get Dr. Virgie Dad opinion as to whether he would like for you to continue this.  You have bone thinning, which is worse than at last check, but still mild overall, and you are no longer on medications which can weaken the bones.  I would like his opinion, so talk with him about it next month when you see him.  If he prefers for you to go back on it, I'm happy to write the prescription for you.  If you remain off, we will need to recheck bone density in 1-2 years.

## 2014-04-28 ENCOUNTER — Other Ambulatory Visit: Payer: Self-pay | Admitting: Family Medicine

## 2014-05-01 ENCOUNTER — Other Ambulatory Visit: Payer: Self-pay | Admitting: Family Medicine

## 2014-05-01 ENCOUNTER — Telehealth: Payer: Self-pay | Admitting: *Deleted

## 2014-05-01 NOTE — Telephone Encounter (Signed)
Message copied by Clinton Sawyer on Mon May 01, 2014 12:10 PM ------      Message from: KNAPP, EVE      Created: Mon May 01, 2014  8:36 AM      Regarding: FW: DEXA results on mutual patient       Please call pt and let her know that Dr. Jana Hakim is fine with her stopping the Boniva.  (we will recheck DEXA in 2 years).            We had discussed this at her visit, but I was waiting to make sure that he was okay with this decision.                   ----- Message -----         From: Chauncey Cruel, MD         Sent: 04/30/2014  11:35 AM           To: Rita Ohara, MD      Subject: RE: DEXA results on mutual patient                       I would be fine stopping Boniva and reassessing in 2 years, if that seems OK to you      ----- Message -----         From: Rita Ohara, MD         Sent: 04/25/2014   4:10 PM           To: Chauncey Cruel, MD      Subject: DEXA results on mutual patient                           FYI--here is recent DEXA.  She is on Boniva.  It shows osteopenia (mild), but with decline from prior tests.  I believe she was taken off her cancer meds that contributed to bone loss.  Just wanted to touch base with you to see if you thought the Boniva could be stopped, or if you would prefer that she stay on it.             She has an appointment scheduled with you in September (and is seeing me 8/12).            I hope you're well!            Eve            ----- Message -----         From: Rad Results In Interface         Sent: 04/24/2014   4:36 PM           To: Rita Ohara, MD                               ------

## 2014-05-01 NOTE — Telephone Encounter (Signed)
Patient advised.

## 2014-05-04 ENCOUNTER — Other Ambulatory Visit: Payer: Self-pay | Admitting: Family Medicine

## 2014-05-15 ENCOUNTER — Ambulatory Visit
Admission: RE | Admit: 2014-05-15 | Discharge: 2014-05-15 | Disposition: A | Discharge status: Home / Self Care | Payer: Medicare Other | Source: Ambulatory Visit | Attending: Family Medicine | Admitting: Family Medicine

## 2014-05-15 DIAGNOSIS — Z853 Personal history of malignant neoplasm of breast: Secondary | ICD-10-CM

## 2014-05-26 ENCOUNTER — Other Ambulatory Visit: Payer: Medicare Other

## 2014-05-26 ENCOUNTER — Ambulatory Visit (HOSPITAL_BASED_OUTPATIENT_CLINIC_OR_DEPARTMENT_OTHER): Payer: Medicare Other | Admitting: Oncology

## 2014-05-26 VITALS — BP 161/66 | HR 70 | Temp 97.6°F | Resp 18 | Ht 67.0 in | Wt 220.9 lb

## 2014-05-26 DIAGNOSIS — I10 Essential (primary) hypertension: Secondary | ICD-10-CM

## 2014-05-26 DIAGNOSIS — M8580 Other specified disorders of bone density and structure, unspecified site: Secondary | ICD-10-CM

## 2014-05-26 DIAGNOSIS — E119 Type 2 diabetes mellitus without complications: Secondary | ICD-10-CM

## 2014-05-26 DIAGNOSIS — I739 Peripheral vascular disease, unspecified: Secondary | ICD-10-CM

## 2014-05-26 DIAGNOSIS — Z8673 Personal history of transient ischemic attack (TIA), and cerebral infarction without residual deficits: Secondary | ICD-10-CM

## 2014-05-26 DIAGNOSIS — G629 Polyneuropathy, unspecified: Secondary | ICD-10-CM

## 2014-05-26 DIAGNOSIS — C50919 Malignant neoplasm of unspecified site of unspecified female breast: Secondary | ICD-10-CM

## 2014-05-26 DIAGNOSIS — Z853 Personal history of malignant neoplasm of breast: Secondary | ICD-10-CM

## 2014-05-26 DIAGNOSIS — M10072 Idiopathic gout, left ankle and foot: Secondary | ICD-10-CM

## 2014-05-26 NOTE — Progress Notes (Signed)
ID: Wandra Arthurs Kerce   DOB: March 20, 1929  MR#: [id number]  ZYS#:[id number]  PCP: Vikki Ports, MD GYN: SU:  OTHER MD:   HISTORY OF PRESENT ILLNESS: From the original intake note:  Surena had a screening mammogram at the breast center March 10, 2007, which showed a possible mass with some calcifications in the left breast.  Diagnostic left mammogram on July 3 showed a spiculated density suspicious for invasive ductal carcinoma, which was not palpable by physical exam.  Ultrasound showed an ill-defined hypoechoic mass 2 cm from the left nipple in the 12 o'clock position measuring 1.5 cm.  This was biopsied under ultrasound guidance the same day and showed ([id number] and [id number]) an invasive ductal carcinoma, which appeared at least intermediate grade.  It was 100% ER positive, 100% PR positive, had a borderline proliferation marker at 19% and was equivocal on the HercepTest at 2+, but negative by FISH with a ratio of 1.33.  With this information the patient was referred to Dr. Brantley Stage and on April 01, 2007 bilateral breast MRIs were obtained.  This showed only the solitary abnormality in the left breast measuring up to 1.4 cm.  The right breast and both axillae were unremarkable.  Accordingly, on April 28, 2007, Dr. Brantley Stage proceeded to left lumpectomy with sentinel lymph node mapping.  The final pathology [id number]) found a 1.7 cm infiltrating ductal carcinoma, grade 2, with negative margins, and 0 of 1 sentinel lymph node involved.  There was no evidence of lymphovascular invasion.   Her subsequent history is as detailed below.  INTERVAL HISTORY: Maydelin returns today for followup of her breast cancer. The interval history is significant for her having completed her adjuvant therapy, stopping the letrozole August of this year. Since she did, she's noted that she has more energy and she thinks her hair is no longer thinning.  REVIEW OF SYSTEMS: She tells me her right shoulder is "frozen was close and  she is getting some therapy for that. She does have multiple muscle aches, and joint pains. These are not new or more intense than before. She sleeps on 3 pillows. She has stress urinary incontinence and frequent urinary tract infections. She bruises easily (on Plavix). Her left foot remains numb. Her blood sugars were "okay". Overall a detailed review of systems today was entirely stable.  PAST MEDICAL HISTORY: Past Medical History  Diagnosis Date  . Incontinence   . Wears glasses   . Arthritis     right knee, left wrist  . Diabetes mellitus     diet controlled  . Hypertension   . Asthma     related to allergies when working; resolved  . Obesity, unspecified   . Dyslipidemia   . Gout   . DJD (degenerative joint disease)   . Diabetic retinopathy   . Membranous glomerulonephropathy     [name]  . Osteopenia L hip, 4/06  . DDD (degenerative disc disease), lumbar 4/06  . Sliding hiatal hernia small sliding hiatal hernia  . Hepatitis A history(in [place]-1950's)  . Mini stroke DrWillis  . Colon polyps     4/09--adenomatous. 04/2010--normal.  Repeat 2014  . Endometrial cancer hx  . Breast cancer 7/08 Inrasine ductal L breast  . Skin cancer     [name]  . History of stroke 10/24/2013    PAST SURGICAL HISTORY: Past Surgical History  Procedure Laterality Date  . Breast lumpectomy      left  . Abdominal hysterectomy      endometrial cancer  .  Kidney stones    . Left middle finger    . Skin cancer excision    . Total knee arthroplasty      left  . Cataract extraction    . Breast lumpectomy  8/08 L Breast ([name])  . Colonoscopy  8/08 [name] (normal)  . Replacement total knee  Left  . Appendectomy    Significant for endometrial cancer, the patient being status post total hysterectomy with bilateral salpingo-oophorectomy in 1987.  The cancer was "level II" and she received no adjuvant therapy.  She also had surgery to her third digit in the left hand, which is not  fully mobile at this point.  She had reconstructive left kidney surgery in 1964 following problems with stones.  She is status post appendectomy.  She is status post bilateral cataract surgery.  She has a history of type 2 diabetes mellitus, which is now controlled with diet with a good hemoglobin A1c according to her report.  She has a history of membranous glomerulonephropathy followed by Dr. Jamal Maes.  She has a history of hypercholesterolemia, a history of recurrent urinary tract infections, which has resolved, a history of hypertension, a history of osteoarthritis particularly involving the knees, and a history of osteoporosis.  FAMILY HISTORY Family History  Problem Relation Age of Onset  . Heart attack Father   . Diabetes Father   . Heart attack Mother   . Hypertension Mother   . Diabetes Mother   . Hypertension Brother   . Melanoma Brother   . Stroke Brother   The patient's father died from myocardial infarction in the setting of diabetes at age 29.  The patient's mother died from complications of a cholecystectomy at the age of 78.  The patient has one brother who was  diagnosed with malignant melanoma.  GYNECOLOGIC HISTORY: She is Gx, P3, first pregnancy age 80, change of life in 80 when she had her surgery.  She never had hormone replacement.  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  Her husband, Shellee Milo, died from pulmonary fibrosis, which was felt to be secondary to reflux.  He also had prostate cancer, which was treated at this center successfully.  The patient's children are Mayra Neer who lives in [place], works as a Education officer, museum particularly with Hispanic women, a daughter in [place] who teaches second grade, and a son in [place], [place] who is a Personal assistant and works for [employer]  The patient has five grandchildren. She tells me her brother used to be a player for the [employer]. She is a  member of the [employer].   ADVANCED DIRECTIVES: in place  HEALTH MAINTENANCE: History  Substance Use Topics  . Smoking status: Never Smoker   . Smokeless tobacco: Never Used  . Alcohol Use: No     Colonoscopy:  PAP:  Bone density:  Lipid panel:  Allergies  Allergen Reactions  . Codeine Nausea And Vomiting  . Contrast Media [Iodinated Diagnostic Agents] Nausea And Vomiting  . Morphine And Related Nausea And Vomiting  . Statins Other (See Comments)    lethargic  . Toprol Xl [Metoprolol Tartrate]     Symptoms of heart attack, per pt, told not to take it again (?chest pain)  . Clindamycin/Lincomycin Itching and Rash  . Penicillins Hives, Swelling and Rash    Current Outpatient Prescriptions  Medication Sig Dispense Refill  . acetaminophen (TYLENOL) 500 MG tablet Take 1,000  mg by mouth 2 (two) times daily.        Marland Kitchen allopurinol (ZYLOPRIM) 300 MG tablet Take 150 mg by mouth daily.      Marland Kitchen amLODipine (NORVASC) 5 MG tablet TAKE 1 TABLET BY MOUTH EVERY DAY  30 tablet  5  . B Complex-C (SUPER B COMPLEX PO) Take 1 capsule by mouth daily.        . Calcium Carbonate-Vitamin D (CALTRATE 600+D PO) Take 2 tablets by mouth daily.      . carvedilol (COREG) 25 MG tablet TAKE 1 & 1/2 TABLETS BY MOUTH TWICE A DAY  45 tablet  2  . Cholecalciferol (VITAMIN D) 2000 UNITS tablet Take 2,000 Units by mouth daily.      . ciprofloxacin (CIPRO) 250 MG tablet Take 250 mg by mouth 2 (two) times daily.      . clopidogrel (PLAVIX) 75 MG tablet TAKE 1 TABLET BY MOUTH EVERY DAY  90 tablet  3  . Coenzyme Q10 (CO Q 10 PO) Take 1 tablet by mouth daily.       . Evening Primrose Oil CAPS Take 1 capsule by mouth daily.        Marland Kitchen gabapentin (NEURONTIN) 300 MG capsule TAKE ONE CAPSULE BY MOUTH AT BEDTIME  30 capsule  11  . glucose blood test strip Use as instructed  100 each  PRN  . hydrochlorothiazide (,MICROZIDE/HYDRODIURIL,) 12.5 MG capsule Take 12.5 mg by mouth daily.        Marland Kitchen ibandronate (BONIVA) 150 MG  tablet TAKE 1 TAB EVERY 30 DAYS EVERY MORNING WITH H20 ON EMPTY STOMACH. NO FOOD/LAY DOWN FOR 30 MIN  1 tablet  5  . KLOR-CON M10 10 MEQ tablet TAKE 1 TABLET BY MOUTH EVERY DAY  30 tablet  5  . losartan (COZAAR) 100 MG tablet TAKE 1 TABLET BY MOUTH TWICE A DAY  60 tablet  5  . Misc Natural Products (OSTEO BI-FLEX ADV TRIPLE ST PO) Take 2 capsules by mouth daily.      . Multiple Vitamin (MULTIVITAMIN) tablet Take 1 tablet by mouth daily.      . Needles & Syringes MISC 1 each by Does not apply route once. Pt uses freestyles lancets  100 each  0  . Omega-3 Fatty Acids (FISH OIL) 1000 MG CAPS Take 1 capsule by mouth 2 (two) times daily.      . Selenium 200 MCG TABS Take 1 tablet by mouth daily.      . solifenacin (VESICARE) 5 MG tablet Take 5 mg by mouth daily.       Marland Kitchen Specialty Vitamins Products ([facility] HAIR/SKIN/NAILS PO) Take 2 each by mouth daily.      Marland Kitchen ZETIA 10 MG tablet TAKE 1 TABLET BY MOUTH EVERY DAY  30 tablet  5  . ZETIA 10 MG tablet TAKE 1 TABLET BY MOUTH EVERY DAY  30 tablet  2  . [DISCONTINUED] potassium chloride (K-DUR) 10 MEQ tablet TAKE 1 TABLET BY MOUTH EVERY DAY  30 tablet  5   No current facility-administered medications for this visit.    OBJECTIVE: Elderly white woman who appears stated age 81 Vitals:   05/26/14 1112  BP: 161/66  Pulse: 70  Temp: 97.6 F (36.4 C)  Resp: 18     Body mass index is 34.59 kg/(m^2).    ECOG FS: 1  Sclerae unicteric, EOMs intact Oropharynx clear, teeth in good repair No cervical or supraclavicular adenopathy Lungs no rales or rhonchi Heart regular rate and  rhythm Abd soft, obese, nontender, positive bowel sounds MSK moderate scoliosis but no focal spinal tenderness  Neuro: nonfocal, well oriented, positive affect Breasts: The right breast is unremarkable except for a chronically inverted nipple. The left breast is status post lumpectomy and radiation. The nipple is chronically inverted. There is no evidence of local recurrence. Left  axilla is benign  LAB RESULTS: Lab Results  Component Value Date   WBC 5.9 01/09/2014   NEUTROABS 2.6 01/09/2014   HGB 13.7 01/09/2014   HCT 40.9 01/09/2014   MCV 93.0 01/09/2014   PLT 171 01/09/2014      Chemistry      Component Value Date/Time   NA 142 01/09/2014 1214   NA 143 04/21/2013 1112   K 4.1 01/09/2014 1214   K 4.0 04/21/2013 1112   CL 104 01/09/2014 1214   CO2 30 01/09/2014 1214   CO2 30* 04/21/2013 1112   BUN 26* 01/09/2014 1214   BUN 32.4* 04/21/2013 1112   CREATININE 0.83 01/09/2014 1214   CREATININE 0.8 04/21/2013 1112   CREATININE 0.86 01/21/2013 0520      Component Value Date/Time   CALCIUM 9.7 01/09/2014 1214   CALCIUM 10.0 04/21/2013 1112   ALKPHOS 39 01/09/2014 1214   ALKPHOS 36* 04/21/2013 1112   AST 16 01/09/2014 1214   AST 18 04/21/2013 1112   ALT 12 01/09/2014 1214   ALT 16 04/21/2013 1112   BILITOT 0.6 01/09/2014 1214   BILITOT 0.68 04/21/2013 1112       Lab Results  Component Value Date   LABCA2 21 01/29/2012    No components found with this basename: [id number]    No results found for this basename: INR,  in the last 168 hours  Urinalysis    Component Value Date/Time   COLORURINE YELLOW 01/20/2013 1146    STUDIES: Mm Digital Diagnostic Bilat  05/15/2014   CLINICAL DATA:  History of malignant lumpectomy of the left breast in 2008. Annual re-evaluation.  EXAM: DIGITAL DIAGNOSTIC  bilateral MAMMOGRAM WITH CAD  COMPARISON:  Previous examinations the most recent of which is dated 05/12/2013.  ACR Breast Density Category c: The breast tissue is heterogeneously dense, which may obscure small masses.  FINDINGS: There are mild scarring changes within the central left breast related to the patient's lumpectomy. There is no specific evidence for recurrent tumor or developing malignancy within either breast.  Mammographic images were processed with CAD.  IMPRESSION: No findings worrisome for recurrent tumor or developing malignancy.  RECOMMENDATION: Bilateral screening mammography  in 1 year.  I have discussed the findings and recommendations with the patient. Results were also provided in writing at the conclusion of the visit. If applicable, a reminder letter will be sent to the patient regarding the next appointment.  BI-RADS CATEGORY  1: Negative.   Electronically Signed   By: Luberta Robertson M.D.   On: 05/15/2014 14:39     DUAL X-RAY ABSORPTIOMETRY (DXA) FOR BONE MINERAL DENSITY 04/24/2014 FINDINGS:  Left FOREARM (1/3 RADIUS)  Bone Mineral Density (BMD): 0.630  Young Adult T Score: -1.1  Z Score: 2.4  Left FEMUR new  Bone Mineral Density (BMD): 0.692 G/cm2  Young Adult T-Score: -1.4  Z-Score: 1.0  ASSESSMENT: Patient's diagnostic category is low bone mass by WHO  Criteria.  FRACTURE RISK: Increased  FRAX: World Health Organization FRAX assessment of absolute fracture  risk is not calculated for this patient because the patient has a  history of bone building therapy.  COMPARISON: Statistically significant worsening  bone mineral density  when compared with previous and baseline.   ASSESSMENT: 80 y.o. [place] woman status post left lumpectomy and sentinel lymph node biopsy August of 2008 for a T1 N0 (Stage I) invasive ductal carcinoma, grade 2, strongly estrogen receptor and progesterone receptor positive, HER-2 negative, with a borderline MIB-1. She opted to forego radiation and started tamoxifen September of 2008. After a TIA January 2012 she was switched to Femara which was continued until August 2015  PLAN:  Kalah has completed her adjuvant systemic therapy. She has an excellent prognosis as far as her breast cancer is concerned. At this point I am comfortable releasing her to her primary care physician. All she will need in terms of breast cancer followup is yearly mammography and 8 yearly physician breast exam. That he knows that we'll be glad to see her again at any point in the future if the need arises, but as of now we are making no further routine  appointment for her here.  Rider Ermis C    05/26/2014

## 2014-05-26 NOTE — Addendum Note (Signed)
Addended by: Laureen Abrahams on: 05/26/2014 06:13 PM   Modules accepted: Orders

## 2014-05-29 ENCOUNTER — Telehealth: Payer: Self-pay | Admitting: Oncology

## 2014-05-29 NOTE — Telephone Encounter (Signed)
Sent letter to Dr. Rita Ohara office from [facility]

## 2014-06-03 ENCOUNTER — Other Ambulatory Visit: Payer: Self-pay | Admitting: Oncology

## 2014-07-13 ENCOUNTER — Encounter: Payer: Self-pay | Admitting: Family Medicine

## 2014-07-15 ENCOUNTER — Other Ambulatory Visit: Payer: Self-pay | Admitting: Family Medicine

## 2014-07-27 ENCOUNTER — Encounter: Payer: Self-pay | Admitting: Family Medicine

## 2014-07-27 ENCOUNTER — Ambulatory Visit (INDEPENDENT_AMBULATORY_CARE_PROVIDER_SITE_OTHER): Payer: Medicare Other | Admitting: Family Medicine

## 2014-07-27 VITALS — BP 138/70 | HR 68 | Ht 67.0 in | Wt 215.0 lb

## 2014-07-27 DIAGNOSIS — I739 Peripheral vascular disease, unspecified: Secondary | ICD-10-CM

## 2014-07-27 DIAGNOSIS — E7800 Pure hypercholesterolemia, unspecified: Secondary | ICD-10-CM

## 2014-07-27 DIAGNOSIS — E1159 Type 2 diabetes mellitus with other circulatory complications: Secondary | ICD-10-CM

## 2014-07-27 DIAGNOSIS — E1151 Type 2 diabetes mellitus with diabetic peripheral angiopathy without gangrene: Secondary | ICD-10-CM

## 2014-07-27 DIAGNOSIS — C50912 Malignant neoplasm of unspecified site of left female breast: Secondary | ICD-10-CM

## 2014-07-27 DIAGNOSIS — E119 Type 2 diabetes mellitus without complications: Secondary | ICD-10-CM

## 2014-07-27 DIAGNOSIS — I10 Essential (primary) hypertension: Secondary | ICD-10-CM

## 2014-07-27 LAB — COMPREHENSIVE METABOLIC PANEL
ALBUMIN: 3.6 g/dL (ref 3.5–5.2)
ALK PHOS: 37 U/L — AB (ref 39–117)
ALT: 10 U/L (ref 0–35)
AST: 15 U/L (ref 0–37)
BILIRUBIN TOTAL: 0.9 mg/dL (ref 0.2–1.2)
BUN: 35 mg/dL — AB (ref 6–23)
CO2: 29 mEq/L (ref 19–32)
Calcium: 10.4 mg/dL (ref 8.4–10.5)
Chloride: 103 mEq/L (ref 96–112)
Creat: 0.86 mg/dL (ref 0.50–1.10)
GLUCOSE: 113 mg/dL — AB (ref 70–99)
Potassium: 4.1 mEq/L (ref 3.5–5.3)
Sodium: 139 mEq/L (ref 135–145)
Total Protein: 6.2 g/dL (ref 6.0–8.3)

## 2014-07-27 LAB — LIPID PANEL
CHOL/HDL RATIO: 3.8 ratio
CHOLESTEROL: 125 mg/dL (ref 0–200)
HDL: 33 mg/dL — AB (ref >39)
LDL Cholesterol: 52 mg/dL (ref 0–99)
Triglycerides: 201 mg/dL — ABNORMAL HIGH (ref <150)
VLDL: 40 mg/dL (ref 0–40)

## 2014-07-27 LAB — POCT GLYCOSYLATED HEMOGLOBIN (HGB A1C): Hemoglobin A1C: 6.1

## 2014-07-27 MED ORDER — AMLODIPINE BESYLATE 5 MG PO TABS: ORAL_TABLET | ORAL | Status: DC

## 2014-07-27 MED ORDER — CARVEDILOL 25 MG PO TABS: ORAL_TABLET | ORAL | Status: DC

## 2014-07-27 NOTE — Patient Instructions (Signed)
Continue to try and limit your sweets, sugar, etc. Your diabetes is still controlled without any medication. Getting more frequent exercise will help with the weight and sugars.  Continue to take your same medications.  We will let you know if something else is recommended for triglycerides based on today's results.  Feel free to try other types of omega-3's if you can't tolerate more than 1/day of the fish oil.

## 2014-07-27 NOTE — Progress Notes (Signed)
Chief Complaint  Patient presents with  . Hypertension    fasting med check.    Diabetes:  Controlled by diet, never taken medications.  She states that sugars are usually running in the 130's. She brings in a list from the last week with just 3 values:  149, 138, 153. She states these were fasting.  Denies polydipsia, polyuria.  She has tingling in her left foot since her minor stroke, unchanged. No paresthesias elsewhere.  She is going to water aerobics once a week. She is too busy to go more often than that. She walks when she goes to the store (can't in the neighborhood, uses cane).  HTN:  Bp's over the last week have been 97-117/51-59 with pulse in the mid 60's.  She states that it was unusually low this week, but didn't have any dizziness or symptoms.  Usually her blood pressure runs a little higher, especially when she is upset (related to her son getting a divorce).  She can't remember what BP's are more typical for her. Denies side effects to her medications.  Denies chest pain.  Denies claudication.  She has h/o stroke, and denies any neurologic symptoms (other than the persistent tingling in foot).  Left breast cancer: She "graduated" from going to the oncologist, after going for 7 years. Last visit with Dr. Jana Hakim was in September. She will continue to get yearly mammograms, and see Dr. Nori Riis yearly for her breast (and pelvic) exams.  Chol--last TG was elevated at 260 in April.  She limits sweets, fried foods.  She didn't tolerate fish oil at twice daily, so has only been taking once daily.  She recently started taking Prevagen (for "her mind"), to help with memory.  Denies any specific memory concerns.  PMH, PSH, SH reviewed.  Outpatient Encounter Prescriptions as of 07/27/2014  Medication Sig Note  . Acetaminophen (TYLENOL ARTHRITIS PAIN PO) Take 2 tablets by mouth daily.   Marland Kitchen allopurinol (ZYLOPRIM) 300 MG tablet Take 150 mg by mouth daily.   Marland Kitchen amLODipine (NORVASC) 5 MG tablet TAKE  1 TABLET BY MOUTH EVERY DAY   . Apoaequorin (PREVAGEN PO) Take 1 tablet by mouth daily.   . B Complex-C (SUPER B COMPLEX PO) Take 1 capsule by mouth daily.     . Calcium Carbonate-Vitamin D (CALTRATE 600+D PO) Take 2 tablets by mouth daily.   . carvedilol (COREG) 25 MG tablet TAKE 1 & 1/2 TABLETS BY MOUTH TWICE A DAY   . Cholecalciferol (VITAMIN D) 2000 UNITS tablet Take 2,000 Units by mouth daily.   . clopidogrel (PLAVIX) 75 MG tablet TAKE 1 TABLET BY MOUTH EVERY DAY   . Coenzyme Q10 (CO Q 10 PO) Take 1 tablet by mouth daily.    . Evening Primrose Oil CAPS Take 1 capsule by mouth daily.     Marland Kitchen gabapentin (NEURONTIN) 300 MG capsule TAKE ONE CAPSULE BY MOUTH AT BEDTIME   . glucose blood test strip Use as instructed   . hydrochlorothiazide (,MICROZIDE/HYDRODIURIL,) 12.5 MG capsule Take 12.5 mg by mouth daily.     Marland Kitchen KLOR-CON M10 10 MEQ tablet TAKE 1 TABLET BY MOUTH EVERY DAY   . losartan (COZAAR) 100 MG tablet TAKE 1 TABLET BY MOUTH TWICE A DAY   . Misc Natural Products (OSTEO BI-FLEX ADV TRIPLE ST PO) Take 2 capsules by mouth daily.   . Multiple Vitamin (MULTIVITAMIN) tablet Take 1 tablet by mouth daily. 04/18/2013: Takes a vitamin pack for diabetics  . Needles & Syringes MISC 1  each by Does not apply route once. Pt uses freestyles lancets   . Omega-3 Fatty Acids (FISH OIL) 1000 MG CAPS Take 1 capsule by mouth 2 (two) times daily. 07/27/2014: Takes just 1/day  . Selenium 200 MCG TABS Take 1 tablet by mouth daily. 01/09/2014: Taking this to get her hair/skin/nail vitamin better absorbed  . solifenacin (VESICARE) 5 MG tablet Take 5 mg by mouth daily.    Marland Kitchen Specialty Vitamins Products ([employer]S HAIR/SKIN/NAILS PO) Take 2 each by mouth daily.   Marland Kitchen ZETIA 10 MG tablet TAKE 1 TABLET BY MOUTH EVERY DAY   . [DISCONTINUED] amLODipine (NORVASC) 5 MG tablet TAKE 1 TABLET BY MOUTH EVERY DAY   . [DISCONTINUED] carvedilol (COREG) 25 MG tablet TAKE 1 & 1/2 TABLETS BY MOUTH TWICE A DAY   . [DISCONTINUED] acetaminophen  (TYLENOL) 500 MG tablet Take 1,000 mg by mouth 2 (two) times daily.     . [DISCONTINUED] ciprofloxacin (CIPRO) 250 MG tablet Take 250 mg by mouth 2 (two) times daily.   . [DISCONTINUED] ZETIA 10 MG tablet TAKE 1 TABLET BY MOUTH EVERY DAY    Allergies  Allergen Reactions  . Codeine Nausea And Vomiting  . Contrast Media [Iodinated Diagnostic Agents] Nausea And Vomiting  . Morphine And Related Nausea And Vomiting  . Statins Other (See Comments)    lethargic  . Toprol Xl [Metoprolol Tartrate]     Symptoms of heart attack, per pt, told not to take it again (?chest pain)  . Clindamycin/Lincomycin Itching and Rash  . Penicillins Hives, Swelling and Rash   ROS:  Denies headaches, dizziness, URI symptoms, cough, shortness of breath, chest pain. occ indigestion relieved by Tums. Denies nausea, vomiting, bowel changes. Edema in ankles, unchanged/stable. No bleeding.  +bruising (from meds, unchanged).  Denies any urinary complaints. See HPI  PHYSICAL EXAM: BP 138/70 mmHg  Pulse 68  Ht 5\' 7"  (1.702 m)  Wt 215 lb (97.523 kg)  BMI 33.67 kg/m2 Pleasant, elderly female, appears somewhat younger than stated age.  In good spirits HEENT: PERRL, EOMI, conjunctiva clear Neck: no lymphadenopathy, thyromegaly or carotid bruit Heart: regular rate and rhythm, no murmur Lungs: clear bilaterally  Abdomen: soft, nontender, no mass Extremities: Palpable but diminished pulses.  Trace ankle edema bilaterally Psych: normal mood, affect, hygiene and grooming  Neuro: alert and oriented x 3. Cranial nerves grossly intact Skin: Dry skin on lower legs. Some bruises on forearms.  Lab Results  Component Value Date   HGBA1C 6.1 07/27/2014   ASSESSMENT/PLAN:  Diabetes mellitus without complication - continues to be controlled by diet.  Diet reviewed. Weight loss and daily exercise encouraged - Plan: HgB A1c  Pure hypercholesterolemia - low fat diet. If TG remains elevated, might need to retry increasing fish  oil (vs other omega-3) - Plan: Lipid panel, Comprehensive metabolic panel  Essential hypertension, benign - controlled.  low numbers at home, asymptomatic. Bring monitor to verify accuracy to next visit - Plan: Comprehensive metabolic panel, amLODipine (NORVASC) 5 MG tablet, carvedilol (COREG) 25 MG tablet  Breast cancer, left - in remission, recently discharged from oncologist's care  DM (diabetes mellitus), type 2 with peripheral vascular complications  PAD (peripheral artery disease) - mild/stable.  continue current meds.  BP at goal, LDL has been at goal  Recommend bringing bp monitor to verify accuracy at next apointment  6 months med check

## 2014-07-28 ENCOUNTER — Encounter: Payer: Self-pay | Admitting: Family Medicine

## 2014-08-07 ENCOUNTER — Other Ambulatory Visit: Payer: Self-pay | Admitting: Family Medicine

## 2014-08-18 ENCOUNTER — Other Ambulatory Visit: Payer: Self-pay | Admitting: Family Medicine

## 2014-09-14 ENCOUNTER — Other Ambulatory Visit: Payer: Self-pay | Admitting: Family Medicine

## 2014-09-19 LAB — HM DIABETES EYE EXAM

## 2014-09-20 ENCOUNTER — Encounter: Payer: Self-pay | Admitting: *Deleted

## 2014-10-02 ENCOUNTER — Other Ambulatory Visit: Payer: Self-pay | Admitting: Family Medicine

## 2014-10-24 ENCOUNTER — Ambulatory Visit (INDEPENDENT_AMBULATORY_CARE_PROVIDER_SITE_OTHER): Payer: Medicare Other | Admitting: Neurology

## 2014-10-24 ENCOUNTER — Encounter: Payer: Self-pay | Admitting: Neurology

## 2014-10-24 VITALS — BP 161/88 | HR 88

## 2014-10-24 DIAGNOSIS — G629 Polyneuropathy, unspecified: Secondary | ICD-10-CM

## 2014-10-24 DIAGNOSIS — Z8673 Personal history of transient ischemic attack (TIA), and cerebral infarction without residual deficits: Secondary | ICD-10-CM

## 2014-10-24 MED ORDER — CLOPIDOGREL BISULFATE 75 MG PO TABS: 75.0000 mg | ORAL_TABLET | Freq: Every day | ORAL | Status: DC

## 2014-10-24 NOTE — Patient Instructions (Signed)
Stroke Prevention Some medical conditions and behaviors are associated with an increased chance of having a stroke. You may prevent a stroke by making healthy choices and managing medical conditions. HOW CAN I REDUCE MY RISK OF HAVING A STROKE?   Stay physically active. Get at least 30 minutes of activity on most or all days.  Do not smoke. It may also be helpful to avoid exposure to secondhand smoke.  Limit alcohol use. Moderate alcohol use is considered to be:  No more than 2 drinks per day for men.  No more than 1 drink per day for nonpregnant women.  Eat healthy foods. This involves:  Eating 5 or more servings of fruits and vegetables a day.  Making dietary changes that address high blood pressure (hypertension), high cholesterol, diabetes, or obesity.  Manage your cholesterol levels.  Making food choices that are high in fiber and low in saturated fat, trans fat, and cholesterol may control cholesterol levels.  Take any prescribed medicines to control cholesterol as directed by your health care provider.  Manage your diabetes.  Controlling your carbohydrate and sugar intake is recommended to manage diabetes.  Take any prescribed medicines to control diabetes as directed by your health care provider.  Control your hypertension.  Making food choices that are low in salt (sodium), saturated fat, trans fat, and cholesterol is recommended to manage hypertension.  Take any prescribed medicines to control hypertension as directed by your health care provider.  Maintain a healthy weight.  Reducing calorie intake and making food choices that are low in sodium, saturated fat, trans fat, and cholesterol are recommended to manage weight.  Stop drug abuse.  Avoid taking birth control pills.  Talk to your health care provider about the risks of taking birth control pills if you are over 35 years old, smoke, get migraines, or have ever had a blood clot.  Get evaluated for sleep  disorders (sleep apnea).  Talk to your health care provider about getting a sleep evaluation if you snore a lot or have excessive sleepiness.  Take medicines only as directed by your health care provider.  For some people, aspirin or blood thinners (anticoagulants) are helpful in reducing the risk of forming abnormal blood clots that can lead to stroke. If you have the irregular heart rhythm of atrial fibrillation, you should be on a blood thinner unless there is a good reason you cannot take them.  Understand all your medicine instructions.  Make sure that other conditions (such as anemia or atherosclerosis) are addressed. SEEK IMMEDIATE MEDICAL CARE IF:   You have sudden weakness or numbness of the face, arm, or leg, especially on one side of the body.  Your face or eyelid droops to one side.  You have sudden confusion.  You have trouble speaking (aphasia) or understanding.  You have sudden trouble seeing in one or both eyes.  You have sudden trouble walking.  You have dizziness.  You have a loss of balance or coordination.  You have a sudden, severe headache with no known cause.  You have new chest pain or an irregular heartbeat. Any of these symptoms may represent a serious problem that is an emergency. Do not wait to see if the symptoms will go away. Get medical help at once. Call your local emergency services (911 in [place] Do not drive yourself to the hospital. Document Released: 10/09/2004 Document Revised: 01/16/2014 Document Reviewed: 03/04/2013 Exit[employer] Patient Information 2015 [employer]. This information is not intended to replace advice given   to you by your health care provider. Make sure you discuss any questions you have with your health care provider.  

## 2014-10-24 NOTE — Progress Notes (Signed)
Reason for visit: History of stroke  Kristin Willis is an 81 y.o. female  History of present illness:  Kristin Willis is an 81 year old right-handed white female with a prior history of cerebrovascular disease. The patient has been on Plavix, she is doing well on this medication. She has not had any further cerebrovascular events, and no new medical issues that have come up. She has improved her chronic renal insufficiency, and she is being treated for left-sided plantar fasciitis. She has a frozen shoulder on the right. She does have some gait instability, she has not had any falls, she uses a cane for ambulation. She has diet-controlled diabetes.  Past Medical History  Diagnosis Date  . Incontinence   . Wears glasses   . Arthritis     right knee, left wrist  . Diabetes mellitus     diet controlled  . Hypertension   . Asthma     related to allergies when working; resolved  . Obesity, unspecified   . Dyslipidemia   . Gout   . DJD (degenerative joint disease)   . Diabetic retinopathy   . Membranous glomerulonephropathy     Kristin Willis  . Osteopenia L hip, 4/06  . DDD (degenerative disc disease), lumbar 4/06  . Sliding hiatal hernia small sliding hiatal hernia  . Hepatitis A history(in [place]-1950's)  . Mini stroke DrWillis  . Colon polyps     4/09--adenomatous. 04/2010--normal.  Repeat 2014  . Endometrial cancer hx  . Breast cancer 7/08 Inrasine ductal L breast  . Skin cancer     Kristin Willis  . History of stroke 10/24/2013    Past Surgical History  Procedure Laterality Date  . Breast lumpectomy      left  . Abdominal hysterectomy      endometrial cancer  . Kidney stones    . Left middle finger    . Skin cancer excision    . Total knee arthroplasty      left  . Cataract extraction    . Breast lumpectomy  8/08 L Breast (Kristin Willis)  . Colonoscopy  8/08 Kristin Willis (normal)  . Replacement total knee  Left  . Appendectomy      Family History  Problem Relation Age of  Onset  . Heart attack Father   . Diabetes Father   . Heart attack Mother   . Hypertension Mother   . Diabetes Mother   . Hypertension Brother   . Melanoma Brother   . Stroke Brother     Social history:  reports that she has never smoked. She has never used smokeless tobacco. She reports that she does not drink alcohol or use illicit drugs.    Allergies  Allergen Reactions  . Codeine Nausea And Vomiting  . Contrast Media [Iodinated Diagnostic Agents] Nausea And Vomiting  . Morphine And Related Nausea And Vomiting  . Statins Other (See Comments)    lethargic  . Toprol Xl [Metoprolol Tartrate]     Symptoms of heart attack, per pt, told not to take it again (?chest pain)  . Clindamycin/Lincomycin Itching and Rash  . Penicillins Hives, Swelling and Rash    Medications:  Current Outpatient Prescriptions on File Prior to Visit  Medication Sig Dispense Refill  . Acetaminophen (TYLENOL ARTHRITIS PAIN PO) Take 2 tablets by mouth daily.    Marland Kitchen allopurinol (ZYLOPRIM) 300 MG tablet Take 150 mg by mouth daily.    Marland Kitchen amLODipine (NORVASC) 5 MG tablet TAKE 1 TABLET BY MOUTH EVERY  DAY 90 tablet 1  . Apoaequorin (PREVAGEN PO) Take 1 tablet by mouth daily.    . B Complex-C (SUPER B COMPLEX PO) Take 1 capsule by mouth daily.      . Calcium Carbonate-Vitamin D (CALTRATE 600+D PO) Take 2 tablets by mouth daily.    . carvedilol (COREG) 25 MG tablet TAKE 1 & 1/2 TABLETS BY MOUTH TWICE A DAY 135 tablet 1  . Cholecalciferol (VITAMIN D) 2000 UNITS tablet Take 2,000 Units by mouth daily.    . Coenzyme Q10 (CO Q 10 PO) Take 1 tablet by mouth daily.     . Evening Primrose Oil CAPS Take 1 capsule by mouth daily.      Marland Kitchen gabapentin (NEURONTIN) 300 MG capsule TAKE ONE CAPSULE BY MOUTH AT BEDTIME 30 capsule 11  . glucose blood test strip Use as instructed 100 each PRN  . hydrochlorothiazide (,MICROZIDE/HYDRODIURIL,) 12.5 MG capsule Take 12.5 mg by mouth daily.      Marland Kitchen KLOR-CON M10 10 MEQ tablet TAKE 1 TABLET BY  MOUTH EVERY DAY 30 tablet 5  . losartan (COZAAR) 100 MG tablet TAKE 1 TABLET BY MOUTH TWICE A DAY 60 tablet 5  . Misc Natural Products (OSTEO BI-FLEX ADV TRIPLE ST PO) Take 2 capsules by mouth daily.    . Multiple Vitamin (MULTIVITAMIN) tablet Take 1 tablet by mouth daily.    . Needles & Syringes MISC 1 each by Does not apply route once. Pt uses freestyles lancets 100 each 0  . Omega-3 Fatty Acids (FISH OIL) 1000 MG CAPS Take 1 capsule by mouth 2 (two) times daily.    . Selenium 200 MCG TABS Take 1 tablet by mouth daily.    . solifenacin (VESICARE) 5 MG tablet Take 5 mg by mouth daily.     Marland Kitchen Specialty Vitamins Products ([facility] HAIR/SKIN/NAILS PO) Take 2 each by mouth daily.    Marland Kitchen ZETIA 10 MG tablet TAKE 1 TABLET BY MOUTH EVERY DAY 30 tablet 5  . [DISCONTINUED] potassium chloride (K-DUR) 10 MEQ tablet TAKE 1 TABLET BY MOUTH EVERY DAY 30 tablet 5   No current facility-administered medications on file prior to visit.    ROS:  Out of a complete 14 system review of symptoms, the patient complains only of the following symptoms, and all other reviewed systems are negative.  Excessive of sweating Occasional difficulty swallowing  Eye discharge Leg swelling Bruising easily Back pain, right-sided sciatica, neck stiffness Headache, numbness, left foot  Blood pressure 161/88, pulse 88.  Physical Exam  General: The patient is alert and cooperative at the time of the examination. The patient is moderately obese.  Skin: Trace edema of ankles and foot is noted bilaterally.   Neurologic Exam  Mental status: The patient is oriented x 3.  Cranial nerves: Facial symmetry is present. Speech is normal, no aphasia or dysarthria is noted. Extraocular movements are full. Visual fields are full.  Motor: The patient has good strength in all 4 extremities.  Sensory examination: Soft touch sensation of the face, arms, and legs is symmetric.  Coordination: The patient has good finger-nose-finger and  heel-to-shin bilaterally.  Gait and station: The patient has difficulty arising from a seated position. The patient uses a cane for ambulation, gait is slightly wide-based, unsteady. Tandem gait was not attempted.  Reflexes: Deep tendon reflexes are symmetric, but are depressed.   Assessment/Plan:  1. Cerebrovascular disease  2. Gait disorder  The patient will continue Plavix, prescription was given today, she will follow-up in one  year or if needed.  Jill Alexanders MD 10/24/2014 8:03 PM  [facility] [address] [place],  [address]  Phone [phone] Fax [phone]

## 2014-10-26 ENCOUNTER — Other Ambulatory Visit: Payer: Self-pay | Admitting: Family Medicine

## 2014-10-27 ENCOUNTER — Telehealth: Payer: Self-pay | Admitting: Family Medicine

## 2014-10-27 NOTE — Telephone Encounter (Signed)
Pt informed waiting response

## 2014-10-27 NOTE — Telephone Encounter (Signed)
Patient came in 07/27/14 for a med check and she has an appointment 01/29/15 med check

## 2014-11-04 ENCOUNTER — Other Ambulatory Visit: Payer: Self-pay | Admitting: Family Medicine

## 2014-11-08 ENCOUNTER — Other Ambulatory Visit: Payer: Self-pay | Admitting: Family Medicine

## 2014-11-08 NOTE — Telephone Encounter (Signed)
Faxed appeal for P.A.

## 2014-11-15 NOTE — Telephone Encounter (Signed)
P.A. Appeal for quantity limits approved from 10/27/14 thru 09/15/15, pt informed. Faxed pharmacy

## 2014-12-25 ENCOUNTER — Other Ambulatory Visit: Payer: Self-pay | Admitting: Family Medicine

## 2015-01-29 ENCOUNTER — Encounter: Payer: Self-pay | Admitting: Family Medicine

## 2015-01-29 ENCOUNTER — Ambulatory Visit (INDEPENDENT_AMBULATORY_CARE_PROVIDER_SITE_OTHER): Payer: Medicare Other | Admitting: Family Medicine

## 2015-01-29 ENCOUNTER — Other Ambulatory Visit: Payer: Self-pay | Admitting: Family Medicine

## 2015-01-29 VITALS — BP 138/60 | HR 68 | Ht 66.5 in | Wt 223.2 lb

## 2015-01-29 DIAGNOSIS — E1151 Type 2 diabetes mellitus with diabetic peripheral angiopathy without gangrene: Secondary | ICD-10-CM

## 2015-01-29 DIAGNOSIS — E119 Type 2 diabetes mellitus without complications: Secondary | ICD-10-CM

## 2015-01-29 DIAGNOSIS — Z5181 Encounter for therapeutic drug level monitoring: Secondary | ICD-10-CM

## 2015-01-29 DIAGNOSIS — I10 Essential (primary) hypertension: Secondary | ICD-10-CM | POA: Diagnosis not present

## 2015-01-29 DIAGNOSIS — M8580 Other specified disorders of bone density and structure, unspecified site: Secondary | ICD-10-CM | POA: Diagnosis not present

## 2015-01-29 DIAGNOSIS — M10072 Idiopathic gout, left ankle and foot: Secondary | ICD-10-CM | POA: Diagnosis not present

## 2015-01-29 DIAGNOSIS — R809 Proteinuria, unspecified: Secondary | ICD-10-CM | POA: Diagnosis not present

## 2015-01-29 DIAGNOSIS — N3941 Urge incontinence: Secondary | ICD-10-CM | POA: Diagnosis not present

## 2015-01-29 DIAGNOSIS — I739 Peripheral vascular disease, unspecified: Secondary | ICD-10-CM | POA: Diagnosis not present

## 2015-01-29 DIAGNOSIS — C50912 Malignant neoplasm of unspecified site of left female breast: Secondary | ICD-10-CM | POA: Diagnosis not present

## 2015-01-29 DIAGNOSIS — Z8673 Personal history of transient ischemic attack (TIA), and cerebral infarction without residual deficits: Secondary | ICD-10-CM

## 2015-01-29 DIAGNOSIS — E1159 Type 2 diabetes mellitus with other circulatory complications: Secondary | ICD-10-CM | POA: Diagnosis not present

## 2015-01-29 DIAGNOSIS — E782 Mixed hyperlipidemia: Secondary | ICD-10-CM | POA: Insufficient documentation

## 2015-01-29 DIAGNOSIS — G629 Polyneuropathy, unspecified: Secondary | ICD-10-CM

## 2015-01-29 LAB — CBC WITH DIFFERENTIAL/PLATELET
BASOS PCT: 2 % — AB (ref 0–1)
Basophils Absolute: 0.1 10*3/uL (ref 0.0–0.1)
EOS ABS: 0.3 10*3/uL (ref 0.0–0.7)
Eosinophils Relative: 5 % (ref 0–5)
HCT: 41.2 % (ref 36.0–46.0)
HEMOGLOBIN: 14.1 g/dL (ref 12.0–15.0)
Lymphocytes Relative: 42 % (ref 12–46)
Lymphs Abs: 2.5 10*3/uL (ref 0.7–4.0)
MCH: 33.2 pg (ref 26.0–34.0)
MCHC: 34.2 g/dL (ref 30.0–36.0)
MCV: 96.9 fL (ref 78.0–100.0)
MONO ABS: 0.6 10*3/uL (ref 0.1–1.0)
MPV: 8.4 fL — AB (ref 8.6–12.4)
Monocytes Relative: 10 % (ref 3–12)
NEUTROS PCT: 41 % — AB (ref 43–77)
Neutro Abs: 2.4 10*3/uL (ref 1.7–7.7)
PLATELETS: 163 10*3/uL (ref 150–400)
RBC: 4.25 MIL/uL (ref 3.87–5.11)
RDW: 13.5 % (ref 11.5–15.5)
WBC: 5.9 10*3/uL (ref 4.0–10.5)

## 2015-01-29 LAB — POCT GLYCOSYLATED HEMOGLOBIN (HGB A1C): Hemoglobin A1C: 5.8

## 2015-01-29 MED ORDER — LOSARTAN POTASSIUM 100 MG PO TABS: 100.0000 mg | ORAL_TABLET | Freq: Two times a day (BID) | ORAL | Status: DC

## 2015-01-29 MED ORDER — GLUCOSE BLOOD VI STRP
ORAL_STRIP | Status: DC
Start: 2015-01-29 — End: 2017-05-05

## 2015-01-29 MED ORDER — CARVEDILOL 25 MG PO TABS: ORAL_TABLET | ORAL | Status: DC

## 2015-01-29 MED ORDER — AMLODIPINE BESYLATE 5 MG PO TABS: 5.0000 mg | ORAL_TABLET | Freq: Every day | ORAL | Status: DC

## 2015-01-29 NOTE — Progress Notes (Signed)
Chief Complaint  Patient presents with  . Diabetes    fasting med check.    Diabetes: Controlled by diet, she has never taken medications. She states that sugars are usually running in the 103-161, mostly 120-140.  Denies polydipsia, polyuria. She has tingling in her left foot since her minor stroke, unchanged. No paresthesias elsewhere. She hasn't been going to water aerobics--used to go once a week, reported she was too busy to go more often than that. She walks when she goes to the store (can't in the neighborhood, uses cane). She is concerned about her weight gain.  She went to [employer] just twice--she gained weight. She was told she wasn't eating enough, and needed to drink more water.  She has not yet made any changes.  She is asking about weight loss medications.  HTN: BP's elsewhere fluctuate widely, ranging from 80's/50's, up to 193/94.  More common upper numbers are upper 150's. Denies any headaches, dizziness or other symptoms when BP was noted at home to be high or low.  +significant stressors in the last month (losses). Denies side effects to her medications. Denies chest pain. Denies claudication.  Left breast cancer: She "graduated" from going to the oncologist, after going for 7 years. Last visit with Dr. Jana Hakim was in September 2015. She will continue to get yearly mammograms, and see Dr. Nori Riis yearly for her breast (and pelvic) exams (in July).  hyperlipidemia-- TG was elevated at 260 in April, down to 201 on recheck in November.  HDL was low at 33.  She was advised to try and increase fish oil (try different brands) to 2-3/day, as she had only been taking 1/day.She admits to not increasing the dose, still taking just one daily.  She keeps them in the fridge to minimize the odor.  She limits sweets, fried foods.  She has been taking Prevagen for over 6 months, to help with memory. She notices improvement--easier to recall names.  She is very busy, but keeps up with  everything by keeping an organized, detailed calendar.  Denies any memory concerns--doesn't lose keys, car, get lost driving.  H/o CVA, on Plavix.  Last saw Dr. Jannifer Franklin in February, to continue on Plavix. Some gait instability, uses cane.  No falls. She notes some bruising on her forearms related to her pocketbook.  Gout:  She hasn't had a flare in well over a year.  She is asking about whether or not she could do without taking the allopurinol.  She was started on gabapentin by Dr. Jana Hakim prior to being discharged, for left foot pain.  Currently denies any pain in foot, just a mild tingling.  Got shots in her foot for plantar fasciitis, and pain is improved. She continues to have pain in her hands/fingers, shoulder (frozen).  Recently lost 48 yo son-in-law due to MI.  She is helping her daughter out.  She denies depression or falls. Uses a cane when walking outside the home, to prevent falls. See screening questionnaires.  Outpatient Encounter Prescriptions as of 01/29/2015  Medication Sig  . Acetaminophen (TYLENOL ARTHRITIS PAIN PO) Take 2 tablets by mouth 2 (two) times daily.   Marland Kitchen allopurinol (ZYLOPRIM) 100 MG tablet TAKE 1 TABLET BY MOUTH EVERY DAY  . amLODipine (NORVASC) 5 MG tablet Take 1 tablet (5 mg total) by mouth daily.  Marland Kitchen Apoaequorin (PREVAGEN PO) Take 1 tablet by mouth daily.  . B Complex-C (SUPER B COMPLEX PO) Take 1 capsule by mouth daily.    . Calcium  Carbonate-Vitamin D (CALTRATE 600+D PO) Take 2 tablets by mouth daily.  . carvedilol (COREG) 25 MG tablet TAKE 1 & 1/2 TABLETS BY MOUTH TWICE A DAY  . Cholecalciferol (VITAMIN D) 2000 UNITS tablet Take 2,000 Units by mouth daily.  . clopidogrel (PLAVIX) 75 MG tablet Take 1 tablet (75 mg total) by mouth daily.  . Coenzyme Q10 (CO Q 10 PO) Take 1 tablet by mouth daily.   . Evening Primrose Oil CAPS Take 1 capsule by mouth daily.    Marland Kitchen gabapentin (NEURONTIN) 300 MG capsule TAKE ONE CAPSULE BY MOUTH AT BEDTIME  . glucose blood  test strip Use as instructed  . hydrochlorothiazide (,MICROZIDE/HYDRODIURIL,) 12.5 MG capsule Take 12.5 mg by mouth daily.    Marland Kitchen KLOR-CON M10 10 MEQ tablet TAKE 1 TABLET BY MOUTH EVERY DAY  . losartan (COZAAR) 100 MG tablet Take 1 tablet (100 mg total) by mouth 2 (two) times daily.  . Misc Natural Products (OSTEO BI-FLEX ADV TRIPLE ST PO) Take 2 capsules by mouth daily.  . Multiple Vitamin (MULTIVITAMIN) tablet Take 1 tablet by mouth daily.  . Needles & Syringes MISC 1 each by Does not apply route once. Pt uses freestyles lancets  . Omega-3 Fatty Acids (FISH OIL) 1000 MG CAPS Take 1 capsule by mouth 2 (two) times daily.  . Selenium 200 MCG TABS Take 1 tablet by mouth daily.  . solifenacin (VESICARE) 5 MG tablet Take 5 mg by mouth daily.   Marland Kitchen Specialty Vitamins Products ([facility]S HAIR/SKIN/NAILS PO) Take 2 each by mouth daily.  Marland Kitchen ZETIA 10 MG tablet TAKE 1 TABLET BY MOUTH EVERY DAY  . [DISCONTINUED] amLODipine (NORVASC) 5 MG tablet TAKE 1 TABLET BY MOUTH EVERY DAY  . [DISCONTINUED] carvedilol (COREG) 25 MG tablet TAKE 1 & 1/2 TABLETS BY MOUTH TWICE A DAY  . [DISCONTINUED] glucose blood test strip Use as instructed  . [DISCONTINUED] losartan (COZAAR) 100 MG tablet TAKE 1 TABLET BY MOUTH TWICE A DAY  . Probiotic Product (PROBIOTIC DAILY PO) Take by mouth daily.  . [DISCONTINUED] allopurinol (ZYLOPRIM) 300 MG tablet Take 150 mg by mouth daily.  . [DISCONTINUED] Chlorpheniramine-Pseudoeph 4-60 MG TABS Take by mouth daily.  . [DISCONTINUED] hydrochlorothiazide (HYDRODIURIL) 12.5 MG tablet Take 12.5 mg by mouth daily.  . [DISCONTINUED] letrozole (FEMARA) 2.5 MG tablet Take 2.5 mg by mouth daily.  . [DISCONTINUED] levofloxacin (LEVAQUIN) 500 MG tablet   . [DISCONTINUED] VESICARE 10 MG tablet    No facility-administered encounter medications on file as of 01/29/2015.   Allergies  Allergen Reactions  . Codeine Nausea And Vomiting  . Contrast Media [Iodinated Diagnostic Agents] Nausea And Vomiting  .  Morphine And Related Nausea And Vomiting  . Statins Other (See Comments)    lethargic  . Toprol Xl [Metoprolol Tartrate]     Symptoms of heart attack, per pt, told not to take it again (?chest pain)  . Clindamycin/Lincomycin Itching and Rash  . Penicillins Hives, Swelling and Rash   ROS:  No fevers, chills, headaches, dizziness, chest pain palpitations, nausea, vomiting, bowel changes. Seeing chiro 3x/week for right frozen shoulder. PF pain improved s/p injection and orthotics, periodically flares. Easy bruising, unchanged. No longer having headaches. Persistent numbness in the left foot, but no pain. Recent infection with postnasal drainage, and UTI (seen in urgent care and treated)--denies any recurrent symptoms.  PHYSICAL EXAM:  BP 148/78 mmHg  Pulse 68  Ht 5' 6.5" (1.689 m)  Wt 223 lb 3.2 oz (101.243 kg)  BMI 35.49 kg/m2 138/60  on repeat by MD Pleasant, elderly female, appears somewhat younger than stated age. In good spirits HEENT: PERRL, EOMI, conjunctiva clear Neck: no lymphadenopathy, thyromegaly or carotid bruit Heart: regular rate and rhythm, no murmur Lungs: clear bilaterally  Abdomen: soft, nontender, no mass.  Small, reducible umbilical hernia, nontender Extremities: Palpable but diminished pulses. Trace ankle edema bilaterally Psych: normal mood, affect, hygiene and grooming  Neuro: alert and oriented x 3. Cranial nerves grossly intact Skin: Dry skin on lower legs. Some bruises on forearms and small one on left shin  Diabetic foot exam performed  Lab Results  Component Value Date   HGBA1C 5.8 01/29/2015   ASSESSMENT/PLAN  DM (diabetes mellitus), type 2 with peripheral vascular complications - Plan: Comprehensive metabolic panel, TSH, Microalbumin / creatinine urine ratio  Diabetes mellitus without complication - diet controlled - Plan: HgB A1c  PAD (peripheral artery disease) - stable, no claudication  Microalbuminuria  Peripheral neuropathy -  controlled with neurontin  Breast cancer, left - continue yearly mammograms and breast exam with GYN  Urge incontinence - controlled  Osteopenia  Idiopathic gout of left foot, unspecified chronicity - consider stopping low dose allopurinol if uric acid is very low - Plan: Uric Acid  Essential hypertension, benign - wide fluctuations, overall controlled on current regimen - Plan: amLODipine (NORVASC) 5 MG tablet, carvedilol (COREG) 25 MG tablet, losartan (COZAAR) 100 MG tablet  Mixed hyperlipidemia - elevated TG on last check; encouraged increasing fish oil to 2000-3018m daily - Plan: Lipid panel  History of stroke  Medication monitoring encounter - Plan: Comprehensive metabolic panel, CBC with Differential/Platelet, Lipid panel, Uric Acid  Essential hypertension, benign - Plan: amLODipine (NORVASC) 5 MG tablet, carvedilol (COREG) 25 MG tablet, losartan (COZAAR) 100 MG tablet   c-met, lipids, TSH, CBC, microalbumin, uric acid   If uric acid is <5.5, then likely okay to stop allopurinol.  Consider weaning off gabapentin--was started for foot pain/neuropathy.  Denies any current painful neuropathy, just slight tingling.  Has other pain (in joints). Continue for now, but may not need longterm.   It is important not to skip meals, and not to eat less than 1200 calories in a day.  These two things will slow your metabolism, and make it hard to lose weight. Eat more frequently, in small portions. Resume regular exercise (ie water aerobic classes).  With these measures, you should see the weight come down--not quickly, but with time. I don't recommend any medications for weight loss.   F/u 6 months, sooner prn

## 2015-01-29 NOTE — Patient Instructions (Signed)
It is important not to skip meals, and not to eat less than 1200 calories in a day.  These two things will slow your metabolism, and make it hard to lose weight. Eat more frequently, in small portions. Resume regular exercise (ie water aerobic classes).  With these measures, you should see the weight come down--not quickly, but with time. I don't recommend any medications for weight loss.  We can consider stopping the allopurinol, depending on what your uric acid level is today. At some point, we can consider tapering the dose down on the gabapentin, but there is no rush to do this.  Try and increase your fish oil to at least 2-3 per day--you may tolerate it better if you separate it (and take it with your meals throughout the day) rather than taking it all at once, but you can try it out however you like.

## 2015-01-30 LAB — COMPREHENSIVE METABOLIC PANEL
ALK PHOS: 36 U/L — AB (ref 39–117)
ALT: 12 U/L (ref 0–35)
AST: 15 U/L (ref 0–37)
Albumin: 3.5 g/dL (ref 3.5–5.2)
BUN: 20 mg/dL (ref 6–23)
CO2: 26 meq/L (ref 19–32)
CREATININE: 0.71 mg/dL (ref 0.50–1.10)
Calcium: 9.2 mg/dL (ref 8.4–10.5)
Chloride: 106 mEq/L (ref 96–112)
GLUCOSE: 105 mg/dL — AB (ref 70–99)
Potassium: 3.9 mEq/L (ref 3.5–5.3)
Sodium: 141 mEq/L (ref 135–145)
Total Bilirubin: 0.8 mg/dL (ref 0.2–1.2)
Total Protein: 6.1 g/dL (ref 6.0–8.3)

## 2015-01-30 LAB — MICROALBUMIN / CREATININE URINE RATIO
Creatinine, Urine: 127.7 mg/dL
MICROALB UR: 9.6 mg/dL — AB (ref <2.0)
Microalb Creat Ratio: 75.2 mg/g — ABNORMAL HIGH (ref 0.0–30.0)

## 2015-01-30 LAB — URIC ACID: URIC ACID, SERUM: 3.5 mg/dL (ref 2.4–7.0)

## 2015-01-30 LAB — LIPID PANEL
CHOL/HDL RATIO: 4.2 ratio
CHOLESTEROL: 121 mg/dL (ref 0–200)
HDL: 29 mg/dL — ABNORMAL LOW (ref >=46)
LDL Cholesterol: 36 mg/dL (ref 0–99)
Triglycerides: 282 mg/dL — ABNORMAL HIGH (ref <150)
VLDL: 56 mg/dL — ABNORMAL HIGH (ref 0–40)

## 2015-01-30 LAB — TSH: TSH: 0.828 u[IU]/mL (ref 0.350–4.500)

## 2015-02-19 ENCOUNTER — Other Ambulatory Visit: Payer: Self-pay | Admitting: Family Medicine

## 2015-03-19 ENCOUNTER — Other Ambulatory Visit: Payer: Self-pay | Admitting: Family Medicine

## 2015-03-20 ENCOUNTER — Other Ambulatory Visit: Payer: Self-pay | Admitting: Family Medicine

## 2015-03-20 NOTE — Telephone Encounter (Signed)
Dr.Knapp I have read last office note I do not see if she is to continue this or stop please advise

## 2015-03-22 ENCOUNTER — Other Ambulatory Visit: Payer: Self-pay | Admitting: Family Medicine

## 2015-04-23 ENCOUNTER — Other Ambulatory Visit: Payer: Self-pay | Admitting: Obstetrics & Gynecology

## 2015-04-24 LAB — CYTOLOGY - PAP

## 2015-05-03 ENCOUNTER — Other Ambulatory Visit: Payer: Self-pay | Admitting: Family Medicine

## 2015-05-03 DIAGNOSIS — Z9889 Other specified postprocedural states: Secondary | ICD-10-CM

## 2015-05-08 ENCOUNTER — Other Ambulatory Visit: Payer: Self-pay | Admitting: Family Medicine

## 2015-05-09 ENCOUNTER — Other Ambulatory Visit: Payer: Self-pay | Admitting: Family Medicine

## 2015-05-24 ENCOUNTER — Other Ambulatory Visit: Payer: Self-pay | Admitting: Family Medicine

## 2015-05-24 ENCOUNTER — Ambulatory Visit
Admission: RE | Admit: 2015-05-24 | Discharge: 2015-05-24 | Disposition: A | Discharge status: Home / Self Care | Payer: Medicare Other | Source: Ambulatory Visit | Attending: Family Medicine | Admitting: Family Medicine

## 2015-05-24 DIAGNOSIS — Z9889 Other specified postprocedural states: Secondary | ICD-10-CM

## 2015-05-29 ENCOUNTER — Telehealth: Payer: Self-pay | Admitting: Neurology

## 2015-05-29 NOTE — Telephone Encounter (Signed)
Patient is calling as she is having a wisdom tooth removed and she needs to know if she can stop the Rx plavix for 5 days.  Her dentist name is Orma Flaming @[phone].  Please call her.

## 2015-05-29 NOTE — Telephone Encounter (Signed)
I called the dental office, left a message. Okay to stop the Plavix 5 or 6 days prior to the procedure. Restart after the procedure.

## 2015-05-30 ENCOUNTER — Telehealth: Payer: Self-pay | Admitting: Family Medicine

## 2015-05-30 NOTE — Telephone Encounter (Signed)
This was sent to me.

## 2015-05-30 NOTE — Telephone Encounter (Signed)
The dentists usually prescribe their own antibiotics. She can stop the nitrofurantoin while on the antibiotics for the dentist (don't take both). We have documented allergies to PCN and clinda in her chart, so I think Keflex is a reasonable choice--up to the dentist

## 2015-05-30 NOTE — Telephone Encounter (Signed)
Pt called, and is having her tooth taking out and the dentist  wants to have her take a antibiotic before she has it taking out, pt is on nitrofuratoin for a UTI ,Pt was wondering if she could have k-flex wants to make sure it dosent interact with the nitrofuratoin, said she has had good results with the k-flex pt uses [facility] [address],

## 2015-05-30 NOTE — Telephone Encounter (Signed)
Left message for patient

## 2015-05-31 ENCOUNTER — Telehealth: Payer: Self-pay | Admitting: Internal Medicine

## 2015-05-31 MED ORDER — CEPHALEXIN 500 MG PO CAPS: ORAL_CAPSULE | ORAL | Status: DC

## 2015-05-31 NOTE — Telephone Encounter (Signed)
Tried to call back and they were closed. Will try again tomorrow.

## 2015-05-31 NOTE — Telephone Encounter (Signed)
[place] with Dr Orma Flaming called stating he wants Dr Jannifer Franklin to prescribe pre medication for patient. Upon looking pt has called her PCP Dr Rita Ohara (05/30/15) who has recommended she take Keflex. I relayed to [place] to call PCP for RX.

## 2015-05-31 NOTE — Telephone Encounter (Signed)
Patient called back and she contacted dentist.

## 2015-05-31 NOTE — Telephone Encounter (Signed)
[place] from Dr. Kelby Fam office called concerning this patient. The pt has had a knee replacement surgery and needs to have a tooth extraction at this dentist office. She is allergic to Penicillin, Amoxicillin, and Clendamycin and they want to know what she can take for a pre-med before she gets this procedure done. They can be contacted at [phone]

## 2015-05-31 NOTE — Addendum Note (Signed)
Addended by: Margette Fast on: 05/31/2015 06:42 PM   Modules accepted: Orders

## 2015-05-31 NOTE — Telephone Encounter (Signed)
Advise them that for PCN allergic patients, can give keflex 2000mg  30-60 mins prior to procedure. She may want to check with her orthopedist to see if the guidelines are still recommending this prophylaxis for joint replacements

## 2015-05-31 NOTE — Telephone Encounter (Signed)
I called patient. The patient indicates that she is allergic to penicillin, but she has taken Keflex previously and has done well with it. I will call in a small prescription.

## 2015-06-01 NOTE — Telephone Encounter (Signed)
error 

## 2015-06-04 ENCOUNTER — Other Ambulatory Visit: Payer: Self-pay | Admitting: Oncology

## 2015-06-04 NOTE — Telephone Encounter (Signed)
They don't make 2000mg --that is just the proper dose (the rx would be for 500mg ). Looks like she was prescribed 2 tablets (1000mg ) for prior to procedure, which is probably fine).  This was handled elsewhere.

## 2015-06-04 NOTE — Telephone Encounter (Signed)
It looks like 500mg  of Keflex was sent in by Dr. Jannifer Franklin. Do we still need to send in the 2000mg  of Keflex?

## 2015-06-13 ENCOUNTER — Telehealth: Payer: Self-pay

## 2015-06-22 NOTE — Telephone Encounter (Signed)
Error

## 2015-06-23 ENCOUNTER — Other Ambulatory Visit: Payer: Self-pay | Admitting: Family Medicine

## 2015-06-24 ENCOUNTER — Other Ambulatory Visit: Payer: Self-pay | Admitting: Family Medicine

## 2015-06-25 ENCOUNTER — Other Ambulatory Visit: Payer: Self-pay | Admitting: *Deleted

## 2015-07-28 ENCOUNTER — Other Ambulatory Visit: Payer: Self-pay | Admitting: Family Medicine

## 2015-08-06 ENCOUNTER — Encounter: Payer: Self-pay | Admitting: Family Medicine

## 2015-08-06 ENCOUNTER — Ambulatory Visit (INDEPENDENT_AMBULATORY_CARE_PROVIDER_SITE_OTHER): Payer: Medicare Other | Admitting: Family Medicine

## 2015-08-06 VITALS — BP 122/62 | HR 64 | Temp 96.9°F | Ht 67.0 in | Wt 212.8 lb

## 2015-08-06 DIAGNOSIS — M8580 Other specified disorders of bone density and structure, unspecified site: Secondary | ICD-10-CM

## 2015-08-06 DIAGNOSIS — S00412A Abrasion of left ear, initial encounter: Secondary | ICD-10-CM

## 2015-08-06 DIAGNOSIS — E119 Type 2 diabetes mellitus without complications: Secondary | ICD-10-CM | POA: Diagnosis not present

## 2015-08-06 DIAGNOSIS — G588 Other specified mononeuropathies: Secondary | ICD-10-CM

## 2015-08-06 DIAGNOSIS — I10 Essential (primary) hypertension: Secondary | ICD-10-CM

## 2015-08-06 DIAGNOSIS — E7800 Pure hypercholesterolemia, unspecified: Secondary | ICD-10-CM

## 2015-08-06 DIAGNOSIS — I739 Peripheral vascular disease, unspecified: Secondary | ICD-10-CM

## 2015-08-06 DIAGNOSIS — Z8673 Personal history of transient ischemic attack (TIA), and cerebral infarction without residual deficits: Secondary | ICD-10-CM

## 2015-08-06 DIAGNOSIS — M10072 Idiopathic gout, left ankle and foot: Secondary | ICD-10-CM | POA: Diagnosis not present

## 2015-08-06 DIAGNOSIS — H6122 Impacted cerumen, left ear: Secondary | ICD-10-CM

## 2015-08-06 DIAGNOSIS — E782 Mixed hyperlipidemia: Secondary | ICD-10-CM | POA: Diagnosis not present

## 2015-08-06 DIAGNOSIS — E1151 Type 2 diabetes mellitus with diabetic peripheral angiopathy without gangrene: Secondary | ICD-10-CM

## 2015-08-06 DIAGNOSIS — N3941 Urge incontinence: Secondary | ICD-10-CM | POA: Diagnosis not present

## 2015-08-06 DIAGNOSIS — Z5181 Encounter for therapeutic drug level monitoring: Secondary | ICD-10-CM

## 2015-08-06 DIAGNOSIS — D692 Other nonthrombocytopenic purpura: Secondary | ICD-10-CM

## 2015-08-06 DIAGNOSIS — E559 Vitamin D deficiency, unspecified: Secondary | ICD-10-CM

## 2015-08-06 LAB — LIPID PANEL
CHOLESTEROL: 137 mg/dL (ref 125–200)
HDL: 38 mg/dL — ABNORMAL LOW (ref >=46)
LDL Cholesterol: 59 mg/dL (ref <130)
Total CHOL/HDL Ratio: 3.6 Ratio (ref <=5.0)
Triglycerides: 202 mg/dL — ABNORMAL HIGH (ref <150)
VLDL: 40 mg/dL — ABNORMAL HIGH (ref <30)

## 2015-08-06 LAB — COMPREHENSIVE METABOLIC PANEL
ALBUMIN: 3.8 g/dL (ref 3.6–5.1)
ALK PHOS: 39 U/L (ref 33–130)
ALT: 12 U/L (ref 6–29)
AST: 17 U/L (ref 10–35)
BILIRUBIN TOTAL: 0.9 mg/dL (ref 0.2–1.2)
BUN: 24 mg/dL (ref 7–25)
CALCIUM: 9.9 mg/dL (ref 8.6–10.4)
CO2: 31 mmol/L (ref 20–31)
Chloride: 102 mmol/L (ref 98–110)
Creat: 0.92 mg/dL — ABNORMAL HIGH (ref 0.60–0.88)
GLUCOSE: 108 mg/dL — AB (ref 65–99)
Potassium: 4.4 mmol/L (ref 3.5–5.3)
Sodium: 140 mmol/L (ref 135–146)
TOTAL PROTEIN: 6.7 g/dL (ref 6.1–8.1)

## 2015-08-06 LAB — POCT GLYCOSYLATED HEMOGLOBIN (HGB A1C): HEMOGLOBIN A1C: 5.9

## 2015-08-06 LAB — URIC ACID: URIC ACID, SERUM: 4.1 mg/dL (ref 2.4–7.0)

## 2015-08-06 MED ORDER — EZETIMIBE 10 MG PO TABS: 10.0000 mg | ORAL_TABLET | Freq: Every day | ORAL | Status: DC

## 2015-08-06 MED ORDER — LOSARTAN POTASSIUM 100 MG PO TABS: 100.0000 mg | ORAL_TABLET | Freq: Two times a day (BID) | ORAL | Status: DC

## 2015-08-06 MED ORDER — CEPHALEXIN 500 MG PO CAPS: ORAL_CAPSULE | ORAL | Status: DC

## 2015-08-06 NOTE — Progress Notes (Signed)
Chief Complaint  Patient presents with  . Diabetes    fasting med check. Diagnosed with large umbilical hernia (2 years ago)-Dr.Neal and Dr.Dunham thought she did not need surgery for this. Right shoulder hurts and left foot pain.   . Medication Refill    needs keflex sent to pharmacy as she is having dental procedure next Wed.   . Ear Drainage    can you please look in her ears-cleaned this morning and thought she saw some blood.    She was cleaning her ears with a Q-tip this morning and noticed some blood (along with a lot of wax) in the left ear canal.  Denies any ear pain or discomfort, but has noted some decrease in hearing.  Her grandchildren tell her she keeps the TV too loud.  She was diagnosed with a UTI by [name], and is on cipro since last week (Tuesday).  She didn't have many symptoms, so doesn't really notice any improvement. Denies side effects from the antibiotic.  Diabetes: Controlled by diet, she has never taken medications. She states that sugars are usually running around 110 in the mornings. Denies polydipsia, polyuria. She has tingling in her left foot since her minor stroke, unchanged. No paresthesias elsewhere. She hasn't been going to water aerobics (had out of town guests, too busy, and will remain so until end of January).  She has been more active, walking more.  She lost 11# since her last visit 6 months ago. She has watched what she eats, being more active, and taking apple cidar vinegar each morning before eating.  HTN: She didn't bring her log today, but she reports that her BP's at home are ranging 109-120's/70's.  No longer seeing the high BP's.  Denies side effects of her medications. Denies chest pain. Denies claudication.  Left breast cancer: She "graduated" from going to the oncologist, after going for 7 years. Last visit with Dr. Jana Hakim was in September 2015. She continues to get yearly mammograms at the [facility], and see Dr. Nori Riis yearly  for her breast (and pelvic) exams (last seen in August). She has prolapse of her bladder, and she tried pessaries, but they fall out, and Dr. Nori Riis didn't want to operate.  Hyperlipidemia-- TG was elevated at 282 on her last check in May. HDL was low at 29. She was advised to try and increase fish oil (try different brands) to 2-3/day, as she had only been taking 1/day.She admits to not increasing the dose, still taking just one daily, 1063m of Krill Oil. She keeps them in the freezer to minimize the odor. She limits sweets, fried foods.  She has been taking Prevagen for over a year, to help with memory, and she finds it effective. She notices improvement--easier to recall names. She is very busy, but keeps up with everything by keeping an organized, detailed calendar. Denies any memory concerns--doesn't lose keys, car, get lost driving.  H/o CVA, on Plavix. Last saw Dr. WJannifer Franklinin February, to continue on Plavix. Some gait instability, uses cane. No falls. She notes some bruising on her forearms related to her pocketbook, and on her hands.  Doesn't notice any other bruising, denies any bleeding.  Gout: She hasn't had a flare in well over 2 years.  She stopped the allopurinol 6 months ago, still no flares, and is due to recheck uric acid level today.  She was started on gabapentin by Dr. MJana Hakimprior to being discharged, for left foot pain. Currently denies any pain  in foot, just a mild tingling. She continues to take the gabapentin.  She has frozen shoulder (right). She plans to see orthopedist.  She didn't get much benefit from seeing the chiropractor (helped her neck some). She has arthritis in her right knee, hands.  She takes Tylenol twice daily, along with aspercreme, both of which help some (not with the shoulder pain).  She needed to have a wisdom tooth removed and other dentalwork.  She is having a crown and other work later this week. Requesting refill on her Keflex prior to  procedure.  PMH, [place], SH reviewed   Outpatient Encounter Prescriptions as of 08/06/2015  Medication Sig Note  . Acetaminophen (TYLENOL ARTHRITIS PAIN PO) Take 2 tablets by mouth 2 (two) times daily.    Marland Kitchen amLODipine (NORVASC) 5 MG tablet TAKE 1 TABLET BY MOUTH EVERY DAY   . Apoaequorin (PREVAGEN PO) Take 1 tablet by mouth daily.   . Calcium Carbonate-Vitamin D (CALTRATE 600+D PO) Take 2 tablets by mouth daily.   . carvedilol (COREG) 25 MG tablet TAKE 1 & 1/2 TABLETS BY MOUTH TWICE A DAY   . ciprofloxacin (CIPRO) 500 MG tablet Take 500 mg by mouth 2 (two) times daily.  08/06/2015: On day #6 from [name]  . clopidogrel (PLAVIX) 75 MG tablet Take 1 tablet (75 mg total) by mouth daily.   . Coenzyme Q10 (CO Q 10 PO) Take 1 tablet by mouth daily.    . Evening Primrose Oil CAPS Take 1 capsule by mouth daily.     Marland Kitchen gabapentin (NEURONTIN) 300 MG capsule TAKE ONE CAPSULE BY MOUTH AT BEDTIME   . glucose blood test strip Use as instructed   . hydrochlorothiazide (,MICROZIDE/HYDRODIURIL,) 12.5 MG capsule Take 12.5 mg by mouth daily.     Marland Kitchen KLOR-CON M10 10 MEQ tablet TAKE 1 TABLET BY MOUTH EVERY DAY   . losartan (COZAAR) 100 MG tablet Take 1 tablet (100 mg total) by mouth 2 (two) times daily.   . Misc Natural Products (OSTEO BI-FLEX ADV TRIPLE ST PO) Take 2 capsules by mouth daily.   . Multiple Vitamin (MULTIVITAMIN) tablet Take 1 tablet by mouth daily. 04/18/2013: Takes a vitamin pack for diabetics  . MYRBETRIQ 50 MG TB24 tablet Take 50 mg by mouth daily. 08/06/2015: Started a couple of months ago by Dr. Wendy Poet  . Needles & Syringes MISC 1 each by Does not apply route once. Pt uses freestyles lancets   . Omega-3 Fatty Acids (FISH OIL) 1000 MG CAPS Take 1 capsule by mouth 2 (two) times daily. 08/06/2015: Taking Krill Oil 1080m daily currently  . solifenacin (VESICARE) 5 MG tablet Take 5 mg by mouth daily.    .Marland KitchenSpecialty Vitamins Products ([facility] HAIR/SKIN/NAILS PO) Take 2 each by mouth daily.   .Marland KitchenZETIA  10 MG tablet TAKE 1 TABLET BY MOUTH EVERY DAY   . [DISCONTINUED] Cholecalciferol (VITAMIN D) 2000 UNITS tablet Take 2,000 Units by mouth daily.   . [DISCONTINUED] Probiotic Product (PROBIOTIC DAILY PO) Take by mouth daily.   . cephALEXin (KEFLEX) 500 MG capsule Take 1 tablet prior to dental procedure, take second tablet evening after dental procedure (Patient not taking: Reported on 08/06/2015)   . [DISCONTINUED] allopurinol (ZYLOPRIM) 100 MG tablet TAKE 1 TABLET BY MOUTH EVERY DAY   . [DISCONTINUED] B Complex-C (SUPER B COMPLEX PO) Take 1 capsule by mouth daily.     . [DISCONTINUED] KLOR-CON M10 10 MEQ tablet TAKE 1 TABLET BY MOUTH EVERY DAY   . [DISCONTINUED]  Selenium 200 MCG TABS Take 1 tablet by mouth daily.    No facility-administered encounter medications on file as of 08/06/2015.   Allergies  Allergen Reactions  . Codeine Nausea And Vomiting  . Contrast Media [Iodinated Diagnostic Agents] Nausea And Vomiting  . Morphine And Related Nausea And Vomiting  . Statins Other (See Comments)    lethargic  . Toprol Xl [Metoprolol Tartrate]     Symptoms of heart attack, per pt, told not to take it again (?chest pain)  . Clindamycin/Lincomycin Itching and Rash  . Penicillins Hives, Swelling and Rash   ROS: denies fever, chills, URI symptoms, cough, shortness of breath, chest pain, headaches, dizziness, GI complaints, dysuria, hematuria, bleeding (other than from left ear), depression, insomnia or other complaints, except as noted above in HPI. She reports having an umbilical hernia, denies any pain, and reports it is reducible.  PHYSICAL EXAM: BP 122/62 mmHg  Pulse 64  Temp(Src) 96.9 F (36.1 C) (Tympanic)  Ht _0  (1.702 m)  Wt 212 lb 12.8 oz (96.525 kg)  BMI 33.32 kg/m2  Very pleasant, talkative, well-appearing female in no distress HEENT: PERRL, EOMI, conjunctiva and OP clear.  Some bright red blood along the distal anterior ear canal on the left. No swelling of the canal. Hard  cerumen obstructing view of TM (impacted). Some hard cerumen, nonobstructive, on the right. Neck: no lymphadenopathy, thyromegaly or mass Heart: regular rate and rhythm Lungs: clear bilaterally Abdomen: soft, nontender, no mass Extremities: no edema.  Decreased pulses bilaterally, but palpable (unchanged) Skin: senile purpura and ecchymoses on forearms and hands Neuro: alert and oriented, cranial nerves intact. Normal gait.   Lab Results  Component Value Date   HGBA1C 5.9 08/06/2015   (was 5.8 six months ago).  ASSESSMENT/PLAN:  DM (diabetes mellitus), type 2 with peripheral vascular complications (HCC) - diet controlled. neuropathy stable, mild - Plan: Comprehensive metabolic panel  Diabetes mellitus without complication ([place]) - Plan: HgB A1c  Pure hypercholesterolemia - Plan: ezetimibe (ZETIA) 10 MG tablet  History of stroke - continue plavix  Essential hypertension, benign - controlled - Plan: Comprehensive metabolic panel, losartan (COZAAR) 100 MG tablet  Idiopathic gout of left foot, unspecified chronicity - no recent flares; recheck uric acid since she stopped allopurinol - Plan: Uric acid  Other mononeuropathy  PAD (peripheral artery disease) ([place]) - stable  Urge incontinence - per urologist; med list updated  Mixed hyperlipidemia - Plan: Lipid panel  Osteopenia - Plan: VITAMIN D 25 Hydroxy (Vit-D Deficiency, Fractures)  Medication monitoring encounter - Plan: Lipid panel, Comprehensive metabolic panel, Uric acid  Vitamin D deficiency - Plan: VITAMIN D 25 Hydroxy (Vit-D Deficiency, Fractures)  Senile purpura (HCC)  Cerumen impaction, left - OTC Debrox drops; return for lavage if not improving.  Excoriation of ear canal, left, initial encounter - pt to stop using Q-tips. no active bleeding.   Labs today: Lipid, uric acid, c-met, Vitamin D (was normal in 2009, which was the last check, but had been low in 2008).  Reassured that as long as umbilical hernia  is reducible (and explained what that meant) and nontender, no treatment is needed.  Stop using Q-tips in your ears. Buy some drops to help with the wax at the pharmacy (Debrox drops). If your hearing doesn't improve in the next 1-2 weeks, return here and we will clean the wax out for you.    F/u 6 months--AWV

## 2015-08-06 NOTE — Patient Instructions (Signed)
  Stop using Q-tips in your ears. Buy some drops to help with the wax at the pharmacy (Debrox drops). If your hearing doesn't improve in the next 1-2 weeks, return here and we will clean the wax out for you.  Continue your current medications. Your blood pressure is well controlled. When your guests leave, try and resume your regular level of physical activity.  Return in 6 months for your next visit, at which time we will also do your Annual Wellness Visit.

## 2015-08-07 ENCOUNTER — Other Ambulatory Visit: Payer: Self-pay | Admitting: Family Medicine

## 2015-08-07 DIAGNOSIS — D692 Other nonthrombocytopenic purpura: Secondary | ICD-10-CM | POA: Insufficient documentation

## 2015-08-07 LAB — VITAMIN D 25 HYDROXY (VIT D DEFICIENCY, FRACTURES): Vit D, 25-Hydroxy: 38 ng/mL (ref 30–100)

## 2015-08-10 ENCOUNTER — Other Ambulatory Visit: Payer: Self-pay | Admitting: Family Medicine

## 2015-09-04 ENCOUNTER — Other Ambulatory Visit: Payer: Self-pay | Admitting: Family Medicine

## 2015-09-20 LAB — HM DIABETES EYE EXAM

## 2015-10-23 ENCOUNTER — Ambulatory Visit (INDEPENDENT_AMBULATORY_CARE_PROVIDER_SITE_OTHER): Payer: Medicare Other | Admitting: Neurology

## 2015-10-23 ENCOUNTER — Encounter: Payer: Self-pay | Admitting: Neurology

## 2015-10-23 VITALS — BP 145/67 | HR 65 | Ht 67.0 in | Wt 212.5 lb

## 2015-10-23 DIAGNOSIS — G588 Other specified mononeuropathies: Secondary | ICD-10-CM

## 2015-10-23 MED ORDER — CLOPIDOGREL BISULFATE 75 MG PO TABS: 75.0000 mg | ORAL_TABLET | Freq: Every day | ORAL | Status: DC

## 2015-10-23 NOTE — Progress Notes (Signed)
Reason for visit: Cerebrovascular disease  Kristin Willis is an 82 y.o. female  History of present illness:  Kristin Willis is an 82 year old right-handed white female with a history of cerebrovascular disease with a small right brain stroke in the past. She has some residual numbness of the left foot following the stroke, she remains on Plavix. She has done well since last seen in this regard. She has suffered quite a bit with her arthritis issues involving her knees and her right hip. The patient has a frozen shoulder on the right. She has difficulty getting up and down from a chair because of this, she walks with a cane, she has not had any recent falls. She does have diabetes that is diet-controlled, and some degree of a peripheral neuropathy. She operates a Teacher, music without difficulty. She returns to this office for an evaluation.  Past Medical History  Diagnosis Date  . Incontinence   . Wears glasses   . Arthritis     right knee, left wrist  . Diabetes mellitus     diet controlled  . Hypertension   . Asthma     related to allergies when working; resolved  . Obesity, unspecified   . Dyslipidemia   . Gout   . DJD (degenerative joint disease)   . Diabetic retinopathy   . Membranous glomerulonephropathy     [name]  . Osteopenia L hip, 4/06  . DDD (degenerative disc disease), lumbar 4/06  . Sliding hiatal hernia small sliding hiatal hernia  . Hepatitis A history(in [place]-1950's)  . Mini stroke ([place]) DrWillis  . Colon polyps     4/09--adenomatous. 04/2010--normal.  Repeat 2014  . Endometrial cancer (HCC) hx  . Breast cancer ([place]) 7/08 Inrasine ductal L breast  . Skin cancer     [name]  . History of stroke 10/24/2013    Past Surgical History  Procedure Laterality Date  . Breast lumpectomy      left  . Abdominal hysterectomy      endometrial cancer  . Kidney stones    . Left middle finger    . Skin cancer excision    . Total knee arthroplasty      left    . Cataract extraction    . Breast lumpectomy  8/08 L Breast ([name])  . Colonoscopy  8/08 [name] (normal)  . Replacement total knee  Left  . Appendectomy      Family History  Problem Relation Age of Onset  . Heart attack Father   . Diabetes Father   . Heart attack Mother   . Hypertension Mother   . Diabetes Mother   . Hypertension Brother   . Melanoma Brother   . Stroke Brother     Social history:  reports that she has never smoked. She has never used smokeless tobacco. She reports that she does not drink alcohol or use illicit drugs.    Allergies  Allergen Reactions  . Codeine Nausea And Vomiting  . Contrast Media [Iodinated Diagnostic Agents] Nausea And Vomiting  . Morphine And Related Nausea And Vomiting  . Statins Other (See Comments)    lethargic  . Toprol Xl [Metoprolol Tartrate]     Symptoms of heart attack, per pt, told not to take it again (?chest pain)  . Clindamycin/Lincomycin Itching and Rash  . Penicillins Hives, Swelling and Rash    Medications:  Prior to Admission medications   Medication Sig Start Date End Date Taking? Authorizing Provider  Acetaminophen (TYLENOL ARTHRITIS PAIN PO) Take 2 tablets by mouth 2 (two) times daily.    Yes Historical Provider, MD  amLODipine (NORVASC) 5 MG tablet TAKE 1 TABLET BY MOUTH EVERY DAY 07/30/15  Yes Rita Ohara, MD  Apoaequorin (PREVAGEN PO) Take 1 tablet by mouth daily.   Yes Historical Provider, MD  Calcium Carbonate-Vitamin D (CALTRATE 600+D PO) Take 2 tablets by mouth daily.   Yes Historical Provider, MD  carvedilol (COREG) 25 MG tablet TAKE 1 & 1/2 TABLETS BY MOUTH TWICE A DAY 06/25/15  Yes Rita Ohara, MD  cephALEXin (KEFLEX) 500 MG capsule Take 1 tablet prior to dental procedure, take second tablet evening after dental procedure 08/06/15  Yes Rita Ohara, MD  clopidogrel (PLAVIX) 75 MG tablet Take 1 tablet (75 mg total) by mouth daily. 10/24/14  Yes Kathrynn Ducking, MD  Coenzyme Q10 (CO Q 10 PO) Take 1 tablet by  mouth daily.    Yes Historical Provider, MD  Evening Primrose Oil CAPS Take 1 capsule by mouth daily.     Yes Historical Provider, MD  ezetimibe (ZETIA) 10 MG tablet Take 1 tablet (10 mg total) by mouth daily. 08/06/15  Yes Rita Ohara, MD  gabapentin (NEURONTIN) 300 MG capsule TAKE ONE CAPSULE BY MOUTH AT BEDTIME 06/04/15  Yes Chauncey Cruel, MD  glucose blood test strip Use as instructed 01/29/15  Yes Rita Ohara, MD  hydrochlorothiazide (,MICROZIDE/HYDRODIURIL,) 12.5 MG capsule Take 12.5 mg by mouth daily.     Yes Historical Provider, MD  KLOR-CON M10 10 MEQ tablet TAKE 1 TABLET BY MOUTH EVERY DAY 09/04/15  Yes Rita Ohara, MD  losartan (COZAAR) 100 MG tablet Take 1 tablet (100 mg total) by mouth 2 (two) times daily. 08/06/15  Yes Rita Ohara, MD  Misc Natural Products (OSTEO BI-FLEX ADV TRIPLE ST PO) Take 2 capsules by mouth daily.   Yes Historical Provider, MD  Multiple Vitamin (MULTIVITAMIN) tablet Take 1 tablet by mouth daily.   Yes Historical Provider, MD  MYRBETRIQ 50 MG TB24 tablet Take 50 mg by mouth daily. 07/16/15  Yes Historical Provider, MD  Needles & Syringes MISC 1 each by Does not apply route once. Pt uses freestyles lancets 01/11/13  Yes Rita Ohara, MD  Omega-3 Fatty Acids (FISH OIL) 1000 MG CAPS Take 1 capsule by mouth 2 (two) times daily.   Yes Historical Provider, MD  solifenacin (VESICARE) 5 MG tablet Take 5 mg by mouth daily.    Yes Historical Provider, MD  Specialty Vitamins Products (CVS HAIR/SKIN/NAILS PO) Take 2 each by mouth daily.   Yes Historical Provider, MD    ROS:  Out of a complete 14 system review of symptoms, the patient complains only of the following symptoms, and all other reviewed systems are negative.  Incontinence of bladder, frequency of urination Bruising easily Left foot numbness Gait disturbance, neck pain  Blood pressure 145/67, pulse 65, height 5\' 7"  (1.702 m), weight 212 lb 8 oz (96.389 kg).  Physical Exam  General: The patient is alert and  cooperative at the time of the examination.  Skin: 1+ edema of ankles is noted bilaterally.   Neurologic Exam  Mental status: The patient is alert and oriented x 3 at the time of the examination. The patient has apparent normal recent and remote memory, with an apparently normal attention span and concentration ability.   Cranial nerves: Facial symmetry is present. Speech is normal, no aphasia or dysarthria is noted. Extraocular movements are full. Visual fields are full.  Motor: The patient has good strength in all 4 extremities.  Sensory examination: Soft touch sensation is symmetric on the face, arms, and legs, with exception of some decrease in sensation on the left leg below the knee.  Coordination: The patient has good finger-nose-finger and heel-to-shin bilaterally.  Gait and station: The patient has a slightly wide-based, unsteady gait. Tandem gait was not attempted. Romberg is negative. The patient walks with a cane  Reflexes: Deep tendon reflexes are symmetric, but are depressed.   Assessment/Plan:  1. Cerebrovascular disease, residual left foot numbness  2. Peripheral neuropathy  3. Gait disturbance  The patient is doing relatively well at this point. She has a lot of issues with degenerative arthritis involving the knees and right hip. The patient will continue the Plavix, a prescription was called in, she will follow-up in one year  C. Floyde Parkins MD 10/23/2015 8:59 PM  [facility] [address] [place], [place] [address]  Phone [phone] Fax [phone]

## 2015-10-23 NOTE — Patient Instructions (Signed)

## 2015-10-27 ENCOUNTER — Other Ambulatory Visit: Payer: Self-pay | Admitting: Family Medicine

## 2015-11-18 ENCOUNTER — Other Ambulatory Visit: Payer: Self-pay | Admitting: Family Medicine

## 2015-11-21 ENCOUNTER — Other Ambulatory Visit: Payer: Self-pay | Admitting: Family Medicine

## 2015-12-11 ENCOUNTER — Other Ambulatory Visit: Payer: Self-pay | Admitting: Family Medicine

## 2016-01-27 ENCOUNTER — Other Ambulatory Visit: Payer: Self-pay | Admitting: Family Medicine

## 2016-01-28 ENCOUNTER — Other Ambulatory Visit: Payer: Self-pay | Admitting: *Deleted

## 2016-01-28 MED ORDER — AMLODIPINE BESYLATE 5 MG PO TABS: 5.0000 mg | ORAL_TABLET | Freq: Every day | ORAL | Status: DC

## 2016-02-04 ENCOUNTER — Encounter: Payer: Medicare Other | Admitting: Family Medicine

## 2016-02-10 ENCOUNTER — Other Ambulatory Visit: Payer: Self-pay | Admitting: Family Medicine

## 2016-02-14 ENCOUNTER — Ambulatory Visit (INDEPENDENT_AMBULATORY_CARE_PROVIDER_SITE_OTHER): Payer: Medicare Other | Admitting: Family Medicine

## 2016-02-14 ENCOUNTER — Encounter: Payer: Self-pay | Admitting: Family Medicine

## 2016-02-14 VITALS — BP 124/74 | HR 68 | Ht 67.0 in | Wt 208.0 lb

## 2016-02-14 DIAGNOSIS — D692 Other nonthrombocytopenic purpura: Secondary | ICD-10-CM | POA: Diagnosis not present

## 2016-02-14 DIAGNOSIS — E782 Mixed hyperlipidemia: Secondary | ICD-10-CM

## 2016-02-14 DIAGNOSIS — E7800 Pure hypercholesterolemia, unspecified: Secondary | ICD-10-CM

## 2016-02-14 DIAGNOSIS — Z5181 Encounter for therapeutic drug level monitoring: Secondary | ICD-10-CM | POA: Diagnosis not present

## 2016-02-14 DIAGNOSIS — I10 Essential (primary) hypertension: Secondary | ICD-10-CM | POA: Diagnosis not present

## 2016-02-14 DIAGNOSIS — M10072 Idiopathic gout, left ankle and foot: Secondary | ICD-10-CM | POA: Diagnosis not present

## 2016-02-14 DIAGNOSIS — E1151 Type 2 diabetes mellitus with diabetic peripheral angiopathy without gangrene: Secondary | ICD-10-CM

## 2016-02-14 DIAGNOSIS — I739 Peripheral vascular disease, unspecified: Secondary | ICD-10-CM | POA: Diagnosis not present

## 2016-02-14 DIAGNOSIS — G588 Other specified mononeuropathies: Secondary | ICD-10-CM | POA: Diagnosis not present

## 2016-02-14 LAB — COMPREHENSIVE METABOLIC PANEL
ALT: 11 U/L (ref 6–29)
AST: 18 U/L (ref 10–35)
Albumin: 3.8 g/dL (ref 3.6–5.1)
Alkaline Phosphatase: 37 U/L (ref 33–130)
BUN: 24 mg/dL (ref 7–25)
CHLORIDE: 102 mmol/L (ref 98–110)
CO2: 28 mmol/L (ref 20–31)
CREATININE: 0.92 mg/dL — AB (ref 0.60–0.88)
Calcium: 9.2 mg/dL (ref 8.6–10.4)
GLUCOSE: 99 mg/dL (ref 65–99)
Potassium: 4.1 mmol/L (ref 3.5–5.3)
SODIUM: 140 mmol/L (ref 135–146)
TOTAL PROTEIN: 6.3 g/dL (ref 6.1–8.1)
Total Bilirubin: 0.8 mg/dL (ref 0.2–1.2)

## 2016-02-14 LAB — CBC WITH DIFFERENTIAL/PLATELET
BASOS ABS: 58 {cells}/uL (ref 0–200)
Basophils Relative: 1 %
Eosinophils Absolute: 290 cells/uL (ref 15–500)
Eosinophils Relative: 5 %
HEMATOCRIT: 42.3 % (ref 35.0–45.0)
HEMOGLOBIN: 13.9 g/dL (ref 11.7–15.5)
LYMPHS ABS: 2320 {cells}/uL (ref 850–3900)
LYMPHS PCT: 40 %
MCH: 32 pg (ref 27.0–33.0)
MCHC: 32.9 g/dL (ref 32.0–36.0)
MCV: 97.5 fL (ref 80.0–100.0)
MONO ABS: 580 {cells}/uL (ref 200–950)
MPV: 8.4 fL (ref 7.5–12.5)
Monocytes Relative: 10 %
NEUTROS PCT: 44 %
Neutro Abs: 2552 cells/uL (ref 1500–7800)
PLATELETS: 169 10*3/uL (ref 140–400)
RBC: 4.34 MIL/uL (ref 3.80–5.10)
RDW: 13.2 % (ref 11.0–15.0)
WBC: 5.8 10*3/uL (ref 4.0–10.5)

## 2016-02-14 LAB — TSH: TSH: 0.85 m[IU]/L

## 2016-02-14 LAB — LIPID PANEL
Cholesterol: 137 mg/dL (ref 125–200)
HDL: 37 mg/dL — AB (ref >=46)
LDL CALC: 50 mg/dL (ref <130)
Total CHOL/HDL Ratio: 3.7 Ratio (ref <=5.0)
Triglycerides: 251 mg/dL — ABNORMAL HIGH (ref <150)
VLDL: 50 mg/dL — AB (ref <30)

## 2016-02-14 LAB — URIC ACID: Uric Acid, Serum: 4 mg/dL (ref 2.4–7.0)

## 2016-02-14 LAB — POCT GLYCOSYLATED HEMOGLOBIN (HGB A1C): Hemoglobin A1C: 5.7

## 2016-02-14 MED ORDER — EZETIMIBE 10 MG PO TABS: 10.0000 mg | ORAL_TABLET | Freq: Every day | ORAL | Status: DC

## 2016-02-14 MED ORDER — POTASSIUM CHLORIDE CRYS ER 10 MEQ PO TBCR: 10.0000 meq | EXTENDED_RELEASE_TABLET | Freq: Every day | ORAL | Status: DC

## 2016-02-14 MED ORDER — CARVEDILOL 25 MG PO TABS: ORAL_TABLET | ORAL | Status: DC

## 2016-02-14 MED ORDER — BACITRACIN 500 UNIT/GM OP OINT: 1.0000 "application " | TOPICAL_OINTMENT | Freq: Two times a day (BID) | OPHTHALMIC | Status: DC | PRN

## 2016-02-14 MED ORDER — AMLODIPINE BESYLATE 5 MG PO TABS: 5.0000 mg | ORAL_TABLET | Freq: Every day | ORAL | Status: DC

## 2016-02-14 NOTE — Patient Instructions (Signed)
Continue your current medications. Have the pharmacy contact us when refills are needed--I sent the one sI think you are running out of now.  I encourage you to get back to water aerobics when you are able.  If your shoulder, hip or knees are giving you more trouble, follow up with the orthopedist.

## 2016-02-14 NOTE — Progress Notes (Signed)
Chief Complaint  Patient presents with  . Diabetes    fasting med check.   . Eye Drainage    has a little crusty place on her left eye-brought in old rx for eye ointment and would like refill if possible.    She is reporting some crusting on her left lower eyelids when she wakes up in the mornings, making the eye stick together.  She previously has used bacitracin ophthalmic ointment, but needs a refill.  She sees Dr. Zadie Rhine once yearly. Denies eye pain, watering, change in vision.  Diabetes: Controlled by diet, she has never taken medications. She states that sugars are usually running 95-114 fasting, once was 153 after breakfast.  Checks sugars about once a week. Denies polydipsia, polyuria (drinks a lot, no changes). She has tingling in her left foot since her minor stroke, unchanged. No paresthesias elsewhere. Used to go to water aerobics, but hasn't been going because she has been too busy. She has been more active, walking more in the supermarkets (not riding cart). She has been busy with a lot of out of town guests.  HTN:  BP's at home are ranging 103-127s/47-58's. Denies side effects of her medications. Denies chest pain, headaches, dizziness, shortness of breath. Denies claudication.  Left breast cancer: She "graduated" from going to the oncologist, after going for 7 years. Last visit with Dr. Jana Hakim was in September 2015. She continues to get yearly mammograms at the [facility] (05/2015), and see Dr. Nori Riis yearly for her breast (and pelvic) exams (last seen in August). She has prolapse of her bladder, and she tried pessaries, but they fall out, and Dr. Nori Riis didn't want to operate.  Hyperlipidemia-- She increased her krill oil to 2/day.  Trying to follow a low fat diet.  She has lost 20# overall, per pt, down 5# with recent dieting.  She limits her carbs, sweets, fried foods.  She has been taking Prevagen for about 2 years to help with memory, and she finds it effective. She  notices improvement--easier to recall names. She is very busy, but keeps up with everything by keeping an organized, detailed calendar. Denies any memory concerns--doesn't lose keys, car, get lost driving.  H/o CVA, on Plavix.  She saw neuro in February. She has residual numbness of L foot. Also felt to have a mild peripheral neuropathy, possibly related to her diabetes (diet controlled).  She remains on gabapentin (started by Dr. Jana Hakim in the past).  She denies any pain in her foot, just a mild tingling.  She was told to continue her Plavix. Denies any bleeding or side effects.  Gout: She hasn't had a flare in well over 2 years. She previously took allopurinol, but it was stopped last year. No flares since stopping it.  Immunization History  Administered Date(s) Administered  . Influenza Split 06/14/2012, 06/09/2013, 05/30/2014  . Influenza Whole 07/06/2002, 06/28/2008, 06/25/2011  . Influenza-Unspecified 05/31/2015  . Pneumococcal Conjugate-13 01/09/2014  . Pneumococcal Polysaccharide-23 04/05/2007  . Tdap 04/05/2007  . Zoster 09/15/2004   Past Medical History  Diagnosis Date  . Incontinence   . Wears glasses   . Arthritis     right knee, left wrist  . Diabetes mellitus     diet controlled  . Hypertension   . Asthma     related to allergies when working; resolved  . Obesity, unspecified   . Dyslipidemia   . Gout   . DJD (degenerative joint disease)   . Diabetic retinopathy   . Membranous  glomerulonephropathy     [name]  . Osteopenia L hip, 4/06  . DDD (degenerative disc disease), lumbar 4/06  . Sliding hiatal hernia small sliding hiatal hernia  . Hepatitis A history(in [place]-1950's)  . Mini stroke ([facility]) DrWillis  . Colon polyps     4/09--adenomatous. 04/2010--normal.  Repeat 2014  . Endometrial cancer (HCC) hx  . Breast cancer ([place]) 7/08 Inrasine ductal L breast  . Skin cancer     [name]  . History of stroke 10/24/2013    Past Surgical History   Procedure Laterality Date  . Breast lumpectomy      left  . Abdominal hysterectomy      endometrial cancer  . Kidney stones    . Left middle finger    . Skin cancer excision    . Total knee arthroplasty      left  . Cataract extraction    . Breast lumpectomy  8/08 L Breast ([name])  . Colonoscopy  8/08 [name] (normal)  . Replacement total knee  Left  . Appendectomy      Social History   Social History  . Marital Status: Married    Spouse Name: N/A  . Number of Children: 3  . Years of Education: 18   Occupational History  . retired    Social History Main Topics  . Smoking status: Never Smoker   . Smokeless tobacco: Never Used  . Alcohol Use: No  . Drug Use: No  . Sexual Activity: No   Other Topics Concern  . Not on file   Social History Narrative   Lives alone.  Daughter lives in [place], daughter lives in [place], son in [place], [place].  Widowed (husband was in [place])      Patient drinks 2 cups of tea daily.   Patient is right handed.     Family History  Problem Relation Age of Onset  . Heart attack Father   . Diabetes Father   . Heart attack Mother   . Hypertension Mother   . Diabetes Mother   . Hypertension Brother   . Stroke Brother   . Melanoma Brother   . Diabetes Brother   . Hypertension Son   . Cancer Neg Hx     Outpatient Encounter Prescriptions as of 02/14/2016  Medication Sig Note  . Acetaminophen (TYLENOL ARTHRITIS PAIN PO) Take 2 tablets by mouth 2 (two) times daily.    Marland Kitchen amLODipine (NORVASC) 5 MG tablet Take 1 tablet (5 mg total) by mouth daily.   Marland Kitchen Apoaequorin (PREVAGEN PO) Take 1 tablet by mouth daily.   . Calcium Carbonate-Vitamin D (CALTRATE 600+D PO) Take 2 tablets by mouth daily.   . carvedilol (COREG) 25 MG tablet TAKE 1 & 1/2 TABLETS BY MOUTH TWICE A DAY   . clopidogrel (PLAVIX) 75 MG tablet Take 1 tablet (75 mg total) by mouth daily.   . Coenzyme Q10 (CO Q 10 PO) Take 1 tablet by mouth daily.    . Evening  Primrose Oil CAPS Take 1 capsule by mouth daily.     Marland Kitchen ezetimibe (ZETIA) 10 MG tablet Take 1 tablet (10 mg total) by mouth daily.   Marland Kitchen gabapentin (NEURONTIN) 300 MG capsule TAKE ONE CAPSULE BY MOUTH AT BEDTIME   . glucose blood test strip Use as instructed   . hydrochlorothiazide (HYDRODIURIL) 12.5 MG tablet TAKE 1 TABLET BY MOUTH EVERY DAY   . KLOR-CON M10 10 MEQ tablet TAKE 1 TABLET BY MOUTH EVERY DAY   .  losartan (COZAAR) 100 MG tablet TAKE 1 TABLET (100 MG TOTAL) BY MOUTH 2 (TWO) TIMES DAILY.   Marland Kitchen Misc Natural Products (OSTEO BI-FLEX ADV TRIPLE ST PO) Take 2 capsules by mouth daily.   . Multiple Vitamin (MULTIVITAMIN) tablet Take 1 tablet by mouth daily. 04/18/2013: Takes a vitamin pack for diabetics  . MYRBETRIQ 50 MG TB24 tablet Take 50 mg by mouth daily. 08/06/2015: Started a couple of months ago by Dr. Wendy Poet  . Needles & Syringes MISC 1 each by Does not apply route once. Pt uses freestyles lancets   . Omega-3 Fatty Acids (FISH OIL) 1000 MG CAPS Take 1 capsule by mouth 2 (two) times daily. 02/14/2016: 2040m of Krill oil daily  . solifenacin (VESICARE) 5 MG tablet Take 5 mg by mouth daily.    .Marland KitchenSpecialty Vitamins Products ([employer] HAIR/SKIN/NAILS PO) Take 2 each by mouth daily.   . cephALEXin (KEFLEX) 500 MG capsule Take 1 tablet prior to dental procedure, take second tablet evening after dental procedure (Patient not taking: Reported on 02/14/2016)   . [DISCONTINUED] hydrochlorothiazide (,MICROZIDE/HYDRODIURIL,) 12.5 MG capsule Take 12.5 mg by mouth daily.     . [DISCONTINUED] KLOR-CON M10 10 MEQ tablet TAKE 1 TABLET BY MOUTH EVERY DAY    No facility-administered encounter medications on file as of 02/14/2016.    Allergies  Allergen Reactions  . Codeine Nausea And Vomiting  . Contrast Media [Iodinated Diagnostic Agents] Nausea And Vomiting  . Morphine And Related Nausea And Vomiting  . Statins Other (See Comments)    lethargic  . Toprol Xl [Metoprolol Tartrate]     Symptoms of heart  attack, per pt, told not to take it again (?chest pain)  . Clindamycin/Lincomycin Itching and Rash  . Penicillins Hives, Swelling and Rash    ROS: denies fever, chills, URI symptoms, cough, shortness of breath, chest pain, headaches, dizziness, GI complaints, dysuria (not currently; has h/o frequent bladder infections), hematuria, depression, insomnia or other complaints, except as noted above in HPI. Arthritis in knees and hip, uses a cane. She has pain in the right hip/groin first thing in the morning, better once she gets up.  Some knee pain with walking.  Still has right frozen shoulder. She never ended up seeing the orthopedist for the shoulder. She reports having an umbilical hernia, denies any pain, and reports it is reducible. Some phlegm just in the mornings.  PHYSICAL EXAM: BP 124/74 mmHg  Pulse 68  Ht 5' 7" (1.702 m)  Wt 208 lb (94.348 kg)  BMI 32.57 kg/m2  Very pleasant, talkative, well-appearing female in no distress HEENT: PERRL, EOMI, conjunctiva and OP clear. no swelling, crusting, erythema or other abnormality of the eye or eyelid Neck: no lymphadenopathy, thyromegaly or mass, no bruit Heart: regular rate and rhythm Lungs: clear bilaterally Abdomen: soft, nontender, no mass Extremities: no edema. Decreased pulses bilaterally, but palpable (unchanged) Normal diabetic foot exam. Dry skin. Unable to completely straighten the right knee Skin: senile purpura and ecchymoses on forearms and hands Neuro: alert and oriented, cranial nerves intact. Normal gait, uses cane.    Lab Results  Component Value Date   HGBA1C 5.7 02/14/2016    ASSESSMENT/PLAN:  Type II diabetes mellitus with peripheral circulatory disorder (HCC) - diet controlled;  further weight loss recommended - Plan: HgB A1c, TSH, Microalbumin / creatinine urine ratio  Pure hypercholesterolemia - due for repeat lipid testing; continue zetia - Plan: ezetimibe (ZETIA) 10 MG tablet  DM (diabetes mellitus),  type 2 with peripheral vascular complications (  [place])  Essential hypertension, benign - controlled - Plan: Comprehensive metabolic panel, amLODipine (NORVASC) 5 MG tablet, carvedilol (COREG) 25 MG tablet, potassium chloride (KLOR-CON M10) 10 MEQ tablet  Idiopathic gout of left foot, unspecified chronicity - no further flares, even off allopurinol - Plan: Uric acid  Other mononeuropathy - controlled, minimal symptoms; continue gabapentin  PAD (peripheral artery disease) (HCC) - stable; no claudication. continue current meds  Mixed hyperlipidemia - Plan: Lipid panel  Senile purpura (HCC)  Medication monitoring encounter - Plan: Lipid panel, Comprehensive metabolic panel, CBC with Differential/Platelet   Lipid, uric acid (off meds) c-met, CBC, urine microalb, TSH  Water aerobics are recommended.  Refilled bacitracin ophtho ointment for prn use   Recent AWV from [facility] screening was reviewed. No concerns mentioned (pt denies memory problems, their screen was normal).  F/u 6 months, sooner prn.

## 2016-02-15 ENCOUNTER — Encounter: Payer: Self-pay | Admitting: Family Medicine

## 2016-02-15 LAB — MICROALBUMIN / CREATININE URINE RATIO
CREATININE, URINE: 118 mg/dL (ref 20–320)
MICROALB/CREAT RATIO: 25 ug/mg{creat} (ref <30)
Microalb, Ur: 3 mg/dL

## 2016-03-10 ENCOUNTER — Other Ambulatory Visit: Payer: Self-pay | Admitting: Family Medicine

## 2016-04-24 ENCOUNTER — Other Ambulatory Visit: Payer: Self-pay | Admitting: Family Medicine

## 2016-04-24 DIAGNOSIS — Z853 Personal history of malignant neoplasm of breast: Secondary | ICD-10-CM

## 2016-04-24 DIAGNOSIS — Z1231 Encounter for screening mammogram for malignant neoplasm of breast: Secondary | ICD-10-CM

## 2016-05-26 ENCOUNTER — Ambulatory Visit
Admission: RE | Admit: 2016-05-26 | Discharge: 2016-05-26 | Disposition: A | Discharge status: Home / Self Care | Payer: Medicare Other | Source: Ambulatory Visit | Attending: Family Medicine | Admitting: Family Medicine

## 2016-05-26 DIAGNOSIS — Z853 Personal history of malignant neoplasm of breast: Secondary | ICD-10-CM

## 2016-05-26 DIAGNOSIS — Z1231 Encounter for screening mammogram for malignant neoplasm of breast: Secondary | ICD-10-CM

## 2016-06-02 ENCOUNTER — Other Ambulatory Visit: Payer: Self-pay | Admitting: Oncology

## 2016-06-03 ENCOUNTER — Telehealth: Payer: Self-pay

## 2016-06-03 MED ORDER — GABAPENTIN 300 MG PO CAPS: 300.0000 mg | ORAL_CAPSULE | Freq: Every day | ORAL | 3 refills | Status: DC

## 2016-06-03 NOTE — Telephone Encounter (Signed)
Requesting refill of Gabapentin 300mg . Has not been filled by you prior. Victorino December

## 2016-07-09 ENCOUNTER — Emergency Department (HOSPITAL_COMMUNITY)
Admission: EM | Admit: 2016-07-09 | Discharge: 2016-07-09 | Disposition: A | Discharge status: Home / Self Care | Payer: Medicare Other | Attending: Emergency Medicine | Admitting: Emergency Medicine

## 2016-07-09 ENCOUNTER — Emergency Department (HOSPITAL_COMMUNITY): Payer: Medicare Other

## 2016-07-09 ENCOUNTER — Encounter (HOSPITAL_COMMUNITY): Payer: Self-pay | Admitting: Emergency Medicine

## 2016-07-09 DIAGNOSIS — Z7984 Long term (current) use of oral hypoglycemic drugs: Secondary | ICD-10-CM | POA: Insufficient documentation

## 2016-07-09 DIAGNOSIS — Z85828 Personal history of other malignant neoplasm of skin: Secondary | ICD-10-CM | POA: Diagnosis not present

## 2016-07-09 DIAGNOSIS — Z853 Personal history of malignant neoplasm of breast: Secondary | ICD-10-CM | POA: Insufficient documentation

## 2016-07-09 DIAGNOSIS — J45909 Unspecified asthma, uncomplicated: Secondary | ICD-10-CM | POA: Insufficient documentation

## 2016-07-09 DIAGNOSIS — I10 Essential (primary) hypertension: Secondary | ICD-10-CM | POA: Diagnosis not present

## 2016-07-09 DIAGNOSIS — Z5181 Encounter for therapeutic drug level monitoring: Secondary | ICD-10-CM | POA: Diagnosis not present

## 2016-07-09 DIAGNOSIS — Z96652 Presence of left artificial knee joint: Secondary | ICD-10-CM | POA: Diagnosis not present

## 2016-07-09 DIAGNOSIS — Z79899 Other long term (current) drug therapy: Secondary | ICD-10-CM | POA: Insufficient documentation

## 2016-07-09 DIAGNOSIS — B0229 Other postherpetic nervous system involvement: Secondary | ICD-10-CM | POA: Diagnosis not present

## 2016-07-09 DIAGNOSIS — R29810 Facial weakness: Secondary | ICD-10-CM | POA: Diagnosis present

## 2016-07-09 DIAGNOSIS — E119 Type 2 diabetes mellitus without complications: Secondary | ICD-10-CM | POA: Diagnosis not present

## 2016-07-09 DIAGNOSIS — G510 Bell's palsy: Secondary | ICD-10-CM | POA: Diagnosis not present

## 2016-07-09 LAB — DIFFERENTIAL
BASOS ABS: 0.1 10*3/uL (ref 0.0–0.1)
Basophils Relative: 1 %
EOS ABS: 0.2 10*3/uL (ref 0.0–0.7)
EOS PCT: 4 %
LYMPHS ABS: 1.6 10*3/uL (ref 0.7–4.0)
Lymphocytes Relative: 25 %
MONOS PCT: 10 %
Monocytes Absolute: 0.6 10*3/uL (ref 0.1–1.0)
Neutro Abs: 3.8 10*3/uL (ref 1.7–7.7)
Neutrophils Relative %: 60 %

## 2016-07-09 LAB — RAPID URINE DRUG SCREEN, HOSP PERFORMED
Amphetamines: NOT DETECTED
BARBITURATES: NOT DETECTED
Benzodiazepines: NOT DETECTED
COCAINE: NOT DETECTED
Opiates: NOT DETECTED
Tetrahydrocannabinol: NOT DETECTED

## 2016-07-09 LAB — I-STAT CHEM 8, ED
BUN: 31 mg/dL — ABNORMAL HIGH (ref 6–20)
CALCIUM ION: 1.18 mmol/L (ref 1.15–1.40)
CREATININE: 0.8 mg/dL (ref 0.44–1.00)
Chloride: 101 mmol/L (ref 101–111)
GLUCOSE: 115 mg/dL — AB (ref 65–99)
HCT: 44 % (ref 36.0–46.0)
HEMOGLOBIN: 15 g/dL (ref 12.0–15.0)
Potassium: 4.3 mmol/L (ref 3.5–5.1)
Sodium: 140 mmol/L (ref 135–145)
TCO2: 31 mmol/L (ref 0–100)

## 2016-07-09 LAB — COMPREHENSIVE METABOLIC PANEL
ALT: 17 U/L (ref 14–54)
AST: 30 U/L (ref 15–41)
Albumin: 3.3 g/dL — ABNORMAL LOW (ref 3.5–5.0)
Alkaline Phosphatase: 34 U/L — ABNORMAL LOW (ref 38–126)
Anion gap: 9 (ref 5–15)
BILIRUBIN TOTAL: 1.3 mg/dL — AB (ref 0.3–1.2)
BUN: 22 mg/dL — AB (ref 6–20)
CO2: 27 mmol/L (ref 22–32)
CREATININE: 0.83 mg/dL (ref 0.44–1.00)
Calcium: 9.9 mg/dL (ref 8.9–10.3)
Chloride: 103 mmol/L (ref 101–111)
GFR calc Af Amer: >60 mL/min (ref >60)
Glucose, Bld: 118 mg/dL — ABNORMAL HIGH (ref 65–99)
POTASSIUM: 4.3 mmol/L (ref 3.5–5.1)
Sodium: 139 mmol/L (ref 135–145)
TOTAL PROTEIN: 6.1 g/dL — AB (ref 6.5–8.1)

## 2016-07-09 LAB — URINALYSIS, ROUTINE W REFLEX MICROSCOPIC
Bilirubin Urine: NEGATIVE
Glucose, UA: NEGATIVE mg/dL
Hgb urine dipstick: NEGATIVE
Ketones, ur: NEGATIVE mg/dL
LEUKOCYTES UA: NEGATIVE
NITRITE: POSITIVE — AB
PROTEIN: NEGATIVE mg/dL
Specific Gravity, Urine: 1.012 (ref 1.005–1.030)
pH: 7.5 (ref 5.0–8.0)

## 2016-07-09 LAB — URINE MICROSCOPIC-ADD ON: RBC / HPF: NONE SEEN RBC/hpf (ref 0–5)

## 2016-07-09 LAB — PROTIME-INR
INR: 1.01
Prothrombin Time: 13.3 seconds (ref 11.4–15.2)

## 2016-07-09 LAB — CBC
HEMATOCRIT: 43.4 % (ref 36.0–46.0)
HEMOGLOBIN: 14.6 g/dL (ref 12.0–15.0)
MCH: 33.3 pg (ref 26.0–34.0)
MCHC: 33.6 g/dL (ref 30.0–36.0)
MCV: 99.1 fL (ref 78.0–100.0)
Platelets: 142 10*3/uL — ABNORMAL LOW (ref 150–400)
RBC: 4.38 MIL/uL (ref 3.87–5.11)
RDW: 12.8 % (ref 11.5–15.5)
WBC: 6.3 10*3/uL (ref 4.0–10.5)

## 2016-07-09 LAB — APTT: aPTT: 26 seconds (ref 24–36)

## 2016-07-09 LAB — ETHANOL: Alcohol, Ethyl (B): <5 mg/dL (ref <5)

## 2016-07-09 LAB — CBG MONITORING, ED: Glucose-Capillary: 112 mg/dL — ABNORMAL HIGH (ref 65–99)

## 2016-07-09 LAB — I-STAT TROPONIN, ED: TROPONIN I, POC: 0 ng/mL (ref 0.00–0.08)

## 2016-07-09 MED ORDER — POLYVINYL ALCOHOL 1.4 % OP SOLN: 1.0000 [drp] | OPHTHALMIC | 0 refills | Status: DC

## 2016-07-09 MED ORDER — CIPROFLOXACIN HCL 500 MG PO TABS
500.0000 mg | ORAL_TABLET | Freq: Once | ORAL | Status: AC
  Administered 2016-07-09: 500 mg via ORAL
  Filled 2016-07-09: qty 1

## 2016-07-09 MED ORDER — PREDNISONE 10 MG PO TABS: 40.0000 mg | ORAL_TABLET | Freq: Every day | ORAL | 0 refills | Status: DC

## 2016-07-09 MED ORDER — CIPROFLOXACIN HCL 500 MG PO TABS: 500.0000 mg | ORAL_TABLET | Freq: Two times a day (BID) | ORAL | 0 refills | Status: DC

## 2016-07-09 MED ORDER — VALACYCLOVIR HCL 1 G PO TABS: 1000.0000 mg | ORAL_TABLET | Freq: Three times a day (TID) | ORAL | 0 refills | Status: AC

## 2016-07-09 NOTE — ED Notes (Signed)
Pt given a [place] sandwich and a diet ginger ale, per Dr. Rogene Houston.

## 2016-07-09 NOTE — ED Provider Notes (Signed)
[facility] DEPT Provider Note   CSN: [id number] Arrival date & time: 07/09/16  [id number]     History   Chief Complaint Chief Complaint  Patient presents with  . Facial Droop    HPI Kristin Willis is a 82 y.o. female.  Patient came in with right-sided facial droop. Not clear when it occurred sometime during the night. Granddaughter reported that the during the night there was droop on the side as well. Now resolved. Patient with complaint of some right ear pain. Patient with past history of TIAs. Patient denies any speech problem any visual problems any weakness in the arms or legs or any numbness.      Past Medical History:  Diagnosis Date  . Arthritis    right knee, left wrist  . Asthma    related to allergies when working; resolved  . Breast cancer ([place]) 7/08 Inrasine ductal L breast  . Colon polyps    4/09--adenomatous. 04/2010--normal.  Repeat 2014  . DDD (degenerative disc disease), lumbar 4/06  . Diabetes mellitus    diet controlled  . Diabetic retinopathy   . DJD (degenerative joint disease)   . Dyslipidemia   . Endometrial cancer (HCC) hx  . Gout   . Hepatitis A history(in [place]-1950's)  . History of stroke 10/24/2013  . Hypertension   . Incontinence   . Membranous glomerulonephropathy    [name]  . Mini stroke [facility]) DrWillis  . Obesity, unspecified   . Osteopenia L hip, 4/06  . Skin cancer    [name]  . Sliding hiatal hernia small sliding hiatal hernia  . Wears glasses     Patient Active Problem List   Diagnosis Date Noted  . Senile purpura ([place]) 08/07/2015  . Mixed hyperlipidemia 01/29/2015  . Microalbuminuria 01/10/2014  . Urge incontinence 01/10/2014  . History of stroke 10/24/2013  . PAD (peripheral artery disease) ([place]) 07/11/2013  . Peripheral neuropathy ([place]) 01/10/2013  . Osteopenia 06/21/2012  . Breast cancer ([place]) 09/01/2011  . Gout 08/06/2011  . Pure hypercholesterolemia 03/05/2011  . DM (diabetes mellitus), type 2 with  peripheral vascular complications ([place]) [id number]  . Essential hypertension, benign 03/05/2011    Past Surgical History:  Procedure Laterality Date  . ABDOMINAL HYSTERECTOMY     endometrial cancer  . APPENDECTOMY    . BREAST LUMPECTOMY     left  . BREAST LUMPECTOMY  8/08 L Breast ([name])  . CATARACT EXTRACTION    . COLONOSCOPY  8/08 [name] (normal)  . kidney stones    . left middle finger    . REPLACEMENT TOTAL KNEE  Left  . SKIN CANCER EXCISION    . TOTAL KNEE ARTHROPLASTY     left    OB History    No data available       Home Medications    Prior to Admission medications   Medication Sig Start Date End Date Taking? Authorizing Provider  Acetaminophen (TYLENOL ARTHRITIS PAIN PO) Take 2 tablets by mouth 2 (two) times daily.     Historical Provider, MD  amLODipine (NORVASC) 5 MG tablet Take 1 tablet (5 mg total) by mouth daily. 02/14/16   Rita Ohara, MD  Apoaequorin (PREVAGEN PO) Take 1 tablet by mouth daily.    Historical Provider, MD  bacitracin ophthalmic ointment Place 1 application into the right eye 2 (two) times daily as needed (crusting). 02/14/16   Rita Ohara, MD  Calcium Carbonate-Vitamin D (CALTRATE 600+D PO) Take 2 tablets by mouth daily.  Historical Provider, MD  carvedilol (COREG) 25 MG tablet TAKE 1 & 1/2 TABLETS BY MOUTH TWICE A DAY 02/14/16   Rita Ohara, MD  cephALEXin (KEFLEX) 500 MG capsule Take 1 tablet prior to dental procedure, take second tablet evening after dental procedure Patient not taking: Reported on 02/14/2016 08/06/15   Rita Ohara, MD  ciprofloxacin (CIPRO) 500 MG tablet Take 1 tablet (500 mg total) by mouth 2 (two) times daily. 07/09/16   Fredia Sorrow, MD  clopidogrel (PLAVIX) 75 MG tablet Take 1 tablet (75 mg total) by mouth daily. 10/23/15   Kathrynn Ducking, MD  Coenzyme Q10 (CO Q 10 PO) Take 1 tablet by mouth daily.     Historical Provider, MD  Evening Primrose Oil CAPS Take 1 capsule by mouth daily.      Historical Provider, MD    ezetimibe (ZETIA) 10 MG tablet Take 1 tablet (10 mg total) by mouth daily. 02/14/16   Rita Ohara, MD  gabapentin (NEURONTIN) 300 MG capsule Take 1 capsule (300 mg total) by mouth at bedtime. 06/03/16   Rita Ohara, MD  glucose blood test strip Use as instructed 01/29/15   Rita Ohara, MD  hydrochlorothiazide (HYDRODIURIL) 12.5 MG tablet TAKE 1 TABLET BY MOUTH EVERY DAY 03/10/16   Rita Ohara, MD  losartan (COZAAR) 100 MG tablet TAKE 1 TABLET (100 MG TOTAL) BY MOUTH 2 (TWO) TIMES DAILY. 03/10/16   Rita Ohara, MD  Misc Natural Products (OSTEO BI-FLEX ADV TRIPLE ST PO) Take 2 capsules by mouth daily.    Historical Provider, MD  Multiple Vitamin (MULTIVITAMIN) tablet Take 1 tablet by mouth daily.    Historical Provider, MD  MYRBETRIQ 50 MG TB24 tablet Take 50 mg by mouth daily. 07/16/15   Historical Provider, MD  Needles & Syringes MISC 1 each by Does not apply route once. Pt uses freestyles lancets 01/11/13   Rita Ohara, MD  Omega-3 Fatty Acids (FISH OIL) 1000 MG CAPS Take 1 capsule by mouth 2 (two) times daily.    Historical Provider, MD  polyvinyl alcohol (LIQUIFILM TEARS) 1.4 % ophthalmic solution Place 1 drop into the right eye every 2 (two) hours while awake. 07/09/16   Fredia Sorrow, MD  potassium chloride (KLOR-CON M10) 10 MEQ tablet Take 1 tablet (10 mEq total) by mouth daily. 02/14/16   Rita Ohara, MD  predniSONE (DELTASONE) 10 MG tablet Take 4 tablets (40 mg total) by mouth daily. 07/09/16   Fredia Sorrow, MD  solifenacin (VESICARE) 5 MG tablet Take 5 mg by mouth daily.     Historical Provider, MD  Specialty Vitamins Products ([employer] HAIR/SKIN/NAILS PO) Take 2 each by mouth daily.    Historical Provider, MD  valACYclovir (VALTREX) 1000 MG tablet Take 1 tablet (1,000 mg total) by mouth 3 (three) times daily. 07/09/16 07/23/16  Fredia Sorrow, MD    Family History Family History  Problem Relation Age of Onset  . Heart attack Father   . Diabetes Father   . Heart attack Mother   . Hypertension Mother   .  Diabetes Mother   . Hypertension Brother   . Stroke Brother   . Melanoma Brother   . Diabetes Brother   . Hypertension Son   . Cancer Neg Hx     Social History Social History  Substance Use Topics  . Smoking status: Never Smoker  . Smokeless tobacco: Never Used  . Alcohol use No     Allergies   Codeine; Contrast media [iodinated diagnostic agents]; Morphine and related; Statins; Toprol  xl [metoprolol tartrate]; Clindamycin/lincomycin; and Penicillins   Review of Systems Review of Systems  Constitutional: Negative for fever.  HENT: Positive for ear pain. Negative for congestion.   Eyes: Negative for pain.  Respiratory: Negative for shortness of breath.   Cardiovascular: Negative for chest pain.  Gastrointestinal: Negative for abdominal pain.  Genitourinary: Negative for dysuria.  Musculoskeletal: Negative for back pain, neck pain and neck stiffness.  Skin: Negative for rash.  Neurological: Positive for facial asymmetry and weakness. Negative for headaches.  Psychiatric/Behavioral: Negative for confusion.     Physical Exam Updated Vital Signs BP 153/67   Pulse 73   Resp 16   Ht 5\' 7"  (1.702 m)   Wt 90.7 kg   SpO2 97%   BMI 31.32 kg/m   Physical Exam  Constitutional: She is oriented to person, place, and time. She appears well-developed and well-nourished. No distress.  HENT:  Head: Normocephalic.  Left Ear: External ear normal.  Mouth/Throat: Oropharynx is clear and moist.  Left tragus with some swelling around the tragus and a little bit of erythema. Subsequently developed vesicles there.  Eyes: Conjunctivae and EOM are normal. Pupils are equal, round, and reactive to light.  Some difficulty closing the right eye completely.  Neck: Normal range of motion. Neck supple.  Cardiovascular: Normal rate.   No murmur heard. Abdominal: Soft. Bowel sounds are normal.  Musculoskeletal: Normal range of motion.  Neurological: She is alert and oriented to person,  place, and time. A cranial nerve deficit is present. Coordination normal.  The right facial nerve paralysis consistent with a Bell's palsy forehead involved. Some difficulty closing the right eye.  Skin: Skin is warm. Rash noted.  Nursing note and vitals reviewed.    ED Treatments / Results  Labs (all labs ordered are listed, but only abnormal results are displayed) Labs Reviewed  CBC - Abnormal; Notable for the following:       Result Value   Platelets 142 (*)    All other components within normal limits  COMPREHENSIVE METABOLIC PANEL - Abnormal; Notable for the following:    Glucose, Bld 118 (*)    BUN 22 (*)    Total Protein 6.1 (*)    Albumin 3.3 (*)    Alkaline Phosphatase 34 (*)    Total Bilirubin 1.3 (*)    All other components within normal limits  URINALYSIS, ROUTINE W REFLEX MICROSCOPIC (NOT AT [facility]) - Abnormal; Notable for the following:    APPearance CLOUDY (*)    Nitrite POSITIVE (*)    All other components within normal limits  URINE MICROSCOPIC-ADD ON - Abnormal; Notable for the following:    Squamous Epithelial / LPF 0-5 (*)    Bacteria, UA MANY (*)    All other components within normal limits  CBG MONITORING, ED - Abnormal; Notable for the following:    Glucose-Capillary 112 (*)    All other components within normal limits  I-STAT CHEM 8, ED - Abnormal; Notable for the following:    BUN 31 (*)    Glucose, Bld 115 (*)    All other components within normal limits  URINE CULTURE  ETHANOL  PROTIME-INR  APTT  DIFFERENTIAL  RAPID URINE DRUG SCREEN, HOSP PERFORMED  I-STAT TROPOININ, ED    EKG  EKG Interpretation  Date/Time:  Wednesday July 09 2016 10:00:26 EDT Ventricular Rate:  72 PR Interval:    QRS Duration: 84 QT Interval:  388 QTC Calculation: 425 R Axis:   47 Text Interpretation:  Sinus  rhythm Abnormal R-wave progression, early transition Minimal ST depression, inferior leads Baseline wander in lead(s) I II aVR aVF Confirmed by Janesha Brissette   MD, Kunaal Walkins [phone]) on 07/09/2016 12:24:41 PM       Radiology Ct Head Wo Contrast  Result Date: 07/09/2016 CLINICAL DATA:  Right-sided facial droop. EXAM: CT HEAD WITHOUT CONTRAST TECHNIQUE: Contiguous axial images were obtained from the base of the skull through the vertex without intravenous contrast. COMPARISON:  01/20/2013 FINDINGS: Brain: There is low attenuation throughout the subcortical and periventricular white matter compatible with chronic microvascular disease. Prominence of the sulci and ventricles noted compatible with brain atrophy. There is no abnormal extra-axial fluid collection, intracranial hemorrhage or mass identified. No evidence for acute brain infarct. Vascular: No hyperdense vessel or unexpected calcification. Skull: Normal. Negative for fracture or focal lesion. Sinuses/Orbits: The paranasal sinuses and mastoid air cells are clear peer Other: None IMPRESSION: 1. Chronic microvascular disease and brain atrophy. 2. No acute intracranial abnormality identified. Electronically Signed   By: Kerby Moors M.D.   On: 07/09/2016 10:54   Mr Brain Wo Contrast (neuro Protocol)  Result Date: 07/09/2016 CLINICAL DATA:  Alternating LEFT, and now RIGHT facial droop. Remote history of prior CVA. EXAM: MRI HEAD WITHOUT CONTRAST TECHNIQUE: Multiplanar, multiecho pulse sequences of the brain and surrounding structures were obtained without intravenous contrast. COMPARISON:  CT head earlier today was unremarkable. MR brain 09/26/2010. FINDINGS: Brain: No evidence for acute infarction, hemorrhage, mass lesion, hydrocephalus, or extra-axial fluid. Mild to moderate atrophy. Extensive T2 and FLAIR hyperintensities throughout the white matter, likely chronic microvascular ischemic change. Vascular: Flow voids are maintained throughout the carotid, basilar, and vertebral arteries. There are no areas of chronic hemorrhage. Skull and upper cervical spine: Unremarkable visualized calvarium, skull base,  and cervical vertebrae. Pituitary, pineal, cerebellar tonsils unremarkable. No upper cervical cord lesions. Sinuses/Orbits: No orbital masses or proptosis. Globes appear symmetric. Sinuses appear well aerated, without evidence for air-fluid level. BILATERAL cataract extraction. Other: None. Compared with priors, similar appearance. IMPRESSION: Mild to moderate atrophy with extensive small vessel disease. No acute intracranial findings. No cause seen for the reported symptoms. Electronically Signed   By: Staci Righter M.D.   On: 07/09/2016 15:08    Procedures Procedures (including critical care time)  Medications Ordered in ED Medications  ciprofloxacin (CIPRO) tablet 500 mg (not administered)     Initial Impression / Assessment and Plan / ED Course  I have reviewed the triage vital signs and the nursing notes.  Pertinent labs & imaging results that were available during my care of the patient were reviewed by me and considered in my medical decision making (see chart for details).  Clinical Course   Patient came in what appeared clinically to be a right-sided Bell's palsy. Had some inflammation at the tragus of the right ear as day developed the vesicles develop there consistent with shingles herpes zoster. Patient also has complaint of other neurological symptoms that occur during the night perhaps involving the left side of the face according to the granddaughter of workup for that included head CT and MRI shows no evidence of any other stroke stop. Patient is followed by [facility] neurology patient will be started on antiviral as well as prednisone and liquid tears. As the day developed the facial nerve paralysis on the right side became more pronounced. Patient's workup showed evidence of an asymptomatic urinary tract infection culture sent and will be started on Cipro. Hardie Lora metabolic   Final Clinical Impressions(s) / ED Diagnoses  Final diagnoses:  Bell's palsy  Other  postherpetic nervous system involvement    New Prescriptions New Prescriptions   CIPROFLOXACIN (CIPRO) 500 MG TABLET    Take 1 tablet (500 mg total) by mouth 2 (two) times daily.   POLYVINYL ALCOHOL (LIQUIFILM TEARS) 1.4 % OPHTHALMIC SOLUTION    Place 1 drop into the right eye every 2 (two) hours while awake.   PREDNISONE (DELTASONE) 10 MG TABLET    Take 4 tablets (40 mg total) by mouth daily.   VALACYCLOVIR (VALTREX) 1000 MG TABLET    Take 1 tablet (1,000 mg total) by mouth 3 (three) times daily.     Fredia Sorrow, MD 07/09/16 [phone]

## 2016-07-09 NOTE — ED Triage Notes (Signed)
Pt in from [place] office with c/o R sided facial droop that began last night at 1800. Pt states the droop was L sided last night, today it is R sided. Hx of minor CVA 2 yrs ago, no residual deficits. Pt went to doc's office today for s/s, he sent her here for further eval. Pt alert, VSS, CBG 118 for EMS.

## 2016-07-09 NOTE — ED Notes (Signed)
EKG given to Dr. Zackowski  

## 2016-07-09 NOTE — ED Notes (Signed)
Pt taken to MRI  

## 2016-07-09 NOTE — Discharge Instructions (Signed)
Take the antibiotic Cipro for the urinary tract infection as directed. Urine culture sent for confirmation as sensitivities to the antibiotic. For the herpes zoster take medications as directed that'll both be of the prednisone and the antiviral medicine. These medicines are also appropriate for the Bell's palsy. Make an appointment to follow-up with your neurologist in the next couple days. Return for any new or worse symptoms. Today's workup without any evidence of acute stroke.  Since she has difficult to close your right eye important to use the liquid tears in the eye every 2 hours.

## 2016-07-09 NOTE — ED Notes (Signed)
Pt was refusing to take VS. MD aware.

## 2016-07-09 NOTE — ED Notes (Signed)
Pt's CBG 112.  Informed Vicente Males, RN and Jenny Reichmann, Therapist, sports.

## 2016-07-09 NOTE — ED Notes (Signed)
Patient transported to CT 

## 2016-07-10 ENCOUNTER — Ambulatory Visit (INDEPENDENT_AMBULATORY_CARE_PROVIDER_SITE_OTHER): Payer: Medicare Other | Admitting: Family Medicine

## 2016-07-10 ENCOUNTER — Encounter: Payer: Self-pay | Admitting: Family Medicine

## 2016-07-10 ENCOUNTER — Telehealth: Payer: Self-pay | Admitting: Neurology

## 2016-07-10 VITALS — BP 130/70 | HR 76 | Ht 67.0 in | Wt 198.6 lb

## 2016-07-10 DIAGNOSIS — N3000 Acute cystitis without hematuria: Secondary | ICD-10-CM

## 2016-07-10 DIAGNOSIS — G510 Bell's palsy: Secondary | ICD-10-CM

## 2016-07-10 DIAGNOSIS — E1151 Type 2 diabetes mellitus with diabetic peripheral angiopathy without gangrene: Secondary | ICD-10-CM

## 2016-07-10 DIAGNOSIS — I10 Essential (primary) hypertension: Secondary | ICD-10-CM

## 2016-07-10 NOTE — Progress Notes (Signed)
Chief Complaint  Patient presents with  . Hospitalization Follow-up    follow up. Right ear needs looked at.    Patient went to the ER yesterday with right sided facial weakness/droop.  While there, developed vesicles by the right ear.  She had CT and MRI with no evidence of stroke.  She was started on Cipro for urinary tract infection, culture pending. In talking to the patient and her daughter, she did have a lot of urinary frequency x 1d, not mentioned to the ER doctor, occurred while in ER), which resolved after starting the antibiotics She was started on prednisone and valtrex. She is tolerating these without side effects.  She has had continued right facial weakness, inability to close the right eye, pain at the right ear.  She had a right sided headache yesterday, none today. Had slight irritation and redness to the right eye yesterday, a little better today.  Has been using the eye drops as recommended.  PMH, [place], SH reviewed  Outpatient Encounter Prescriptions as of 07/10/2016  Medication Sig Note  . Acetaminophen (TYLENOL ARTHRITIS PAIN PO) Take 2 tablets by mouth 2 (two) times daily.    Marland Kitchen amLODipine (NORVASC) 5 MG tablet Take 1 tablet (5 mg total) by mouth daily.   Marland Kitchen Apoaequorin (PREVAGEN PO) Take 1 tablet by mouth daily.   . bacitracin ophthalmic ointment Place 1 application into the right eye 2 (two) times daily as needed (crusting).   . Calcium Carbonate-Vitamin D (CALTRATE 600+D PO) Take 2 tablets by mouth daily.   . carvedilol (COREG) 25 MG tablet TAKE 1 & 1/2 TABLETS BY MOUTH TWICE A DAY   . cephALEXin (KEFLEX) 500 MG capsule Take 1 tablet prior to dental procedure, take second tablet evening after dental procedure   . ciprofloxacin (CIPRO) 500 MG tablet Take 1 tablet (500 mg total) by mouth 2 (two) times daily.   . clopidogrel (PLAVIX) 75 MG tablet Take 1 tablet (75 mg total) by mouth daily.   . Coenzyme Q10 (CO Q 10 PO) Take 1 tablet by mouth daily.    . Evening Primrose  Oil CAPS Take 1 capsule by mouth daily.     Marland Kitchen ezetimibe (ZETIA) 10 MG tablet Take 1 tablet (10 mg total) by mouth daily.   Marland Kitchen gabapentin (NEURONTIN) 300 MG capsule Take 1 capsule (300 mg total) by mouth at bedtime.   Marland Kitchen glucose blood test strip Use as instructed   . hydrochlorothiazide (HYDRODIURIL) 12.5 MG tablet TAKE 1 TABLET BY MOUTH EVERY DAY   . losartan (COZAAR) 100 MG tablet TAKE 1 TABLET (100 MG TOTAL) BY MOUTH 2 (TWO) TIMES DAILY.   Marland Kitchen Misc Natural Products (OSTEO BI-FLEX ADV TRIPLE ST PO) Take 2 capsules by mouth daily.   . Multiple Vitamin (MULTIVITAMIN) tablet Take 1 tablet by mouth daily. 04/18/2013: Takes a vitamin pack for diabetics  . MYRBETRIQ 50 MG TB24 tablet Take 50 mg by mouth daily. 08/06/2015: Started a couple of months ago by Dr. Wendy Poet  . Needles & Syringes MISC 1 each by Does not apply route once. Pt uses freestyles lancets   . Omega-3 Fatty Acids (FISH OIL) 1000 MG CAPS Take 1 capsule by mouth 2 (two) times daily. 02/14/2016: 2000mg  of [place] oil daily  . polyvinyl alcohol (LIQUIFILM TEARS) 1.4 % ophthalmic solution Place 1 drop into the right eye every 2 (two) hours while awake.   . potassium chloride (KLOR-CON M10) 10 MEQ tablet Take 1 tablet (10 mEq total) by mouth daily.   Marland Kitchen  predniSONE (DELTASONE) 10 MG tablet Take 4 tablets (40 mg total) by mouth daily.   . solifenacin (VESICARE) 5 MG tablet Take 5 mg by mouth daily.    Marland Kitchen Specialty Vitamins Products (CVS HAIR/SKIN/NAILS PO) Take 2 each by mouth daily.   . valACYclovir (VALTREX) 1000 MG tablet Take 1 tablet (1,000 mg total) by mouth 3 (three) times daily.    No facility-administered encounter medications on file as of 07/10/2016.    Allergies  Allergen Reactions  . Codeine Nausea And Vomiting  . Contrast Media [Iodinated Diagnostic Agents] Nausea And Vomiting  . Morphine And Related Nausea And Vomiting  . Statins Other (See Comments)    lethargic  . Toprol Xl [Metoprolol Tartrate]     Symptoms of heart attack,  per pt, told not to take it again (?chest pain)  . Clindamycin/Lincomycin Itching and Rash  . Penicillins Hives, Swelling and Rash   ROS: no fever, chills, headache (resolved), dizziness, chest pain, shortness of breath, URI symptoms.  +pain at right ear, rash on ear, and facial weakness as per HPI.  No numbness, tingling or other neurologic symptoms.  No bleedin, bruising.  PHYSICAL EXAM:  BP 130/70 (BP Location: Right Arm, Patient Position: Sitting, Cuff Size: Normal)   Pulse 76   Ht 5\' 7"  (1.702 m)   Wt 198 lb 9.6 oz (90.1 kg)   BMI 31.11 kg/m   Well developed, pleasant elderly female, appearing younger than stated age, with significant right sided facial droop HEENT: PERRL, EOMI, conjunctiva and sclera are clear, noninjected. Right eye not moving/blinking, remaining open. Nasal mucosa is normal, sinuses nontender.   Right ear--32mm blister noted, without any surrounding erythema. There is also another blister with yellow crusting more superiorly (tragus).  No erythema; only minimal soft tissue swelling.  EAC and TM is normal.  No blisters/sores/redness/swelling in ear canal. OP is clear, moist mucus membranes.  Tongue is midline.  Neck: no lymphadenopathy, thyromegaly or carotid bruit Heart: regular rate and rhythm Lungs: clear bilaterally Back: no CVA tenderness Abdomen: soft, nontender Extremities: no edema Neuro: alert and oriented.  Right facial nerve weakness, otherwise normal strength, gait, alert and oriented.  Psych:  Normal mood, affect, hygiene and grooming   Lab Results  Component Value Date   WBC 6.3 07/09/2016   HGB 15.0 07/09/2016   HCT 44.0 07/09/2016   MCV 99.1 07/09/2016   PLT 142 (L) 07/09/2016     Chemistry      Component Value Date/Time   NA 140 07/09/2016 1031   NA 143 04/21/2013 1112   K 4.3 07/09/2016 1031   K 4.0 04/21/2013 1112   CL 101 07/09/2016 1031   CO2 27 07/09/2016 1008   CO2 30 (H) 04/21/2013 1112   BUN 31 (H) 07/09/2016 1031   BUN  32.4 (H) 04/21/2013 1112   CREATININE 0.80 07/09/2016 1031   CREATININE 0.92 (H) 02/14/2016 1251   CREATININE 0.8 04/21/2013 1112      Component Value Date/Time   CALCIUM 9.9 07/09/2016 1008   CALCIUM 10.0 04/21/2013 1112   ALKPHOS 34 (L) 07/09/2016 1008   ALKPHOS 36 (L) 04/21/2013 1112   AST 30 07/09/2016 1008   AST 18 04/21/2013 1112   ALT 17 07/09/2016 1008   ALT 16 04/21/2013 1112   BILITOT 1.3 (H) 07/09/2016 1008   BILITOT 0.68 04/21/2013 1112     Urine: +nitrite, 0-5 squam, many bacteria  Lab Results  Component Value Date   HGBA1C 5.7 02/14/2016    ASSESSMENT/PLAN:  Facial paralysis/Bells palsy - due to HSV, which seems to already be responding to Valtrex (decreased erythema surrounding vesicles per dtr). Cont prednisone, valtrex  DM (diabetes mellitus), type 2 with peripheral vascular complications (HCC) - well controlled--advised of risks/side effects of prednisone, expect to see higher sugars temporarily  Essential hypertension, benign - well controlled  Acute cystitis without hematuria - just a few hours of urinary frequency which resolved with ABX. Await culture results, and d/c if neg.   F/u with neuro  Recommend ophtho eval if any irritation/discomfort. Suggested taping eye closed at night, frequent use of drops during the day.   All questions answered. 25 min visit, more than 1/2 spent counseling

## 2016-07-10 NOTE — Telephone Encounter (Signed)
Pt called in to schedule hospital f/u. She was seen in ER 10/25 and diagnosed with Bell's palsy . Her appt is Nov. 21st. Does she need to come in sooner. She has paralysis on left side of face and states she has more symptoms.

## 2016-07-10 NOTE — Patient Instructions (Signed)
Bell Palsy Bell palsy is a condition in which the muscles on one side of the face become paralyzed. This often causes one side of the face to droop. It is a common condition and most people recover completely. RISK FACTORS Risk factors for Bell palsy include:  Pregnancy.  Diabetes.  An infection by a virus, such as infections that cause cold sores. CAUSES  Bell palsy is caused by damage to or inflammation of a nerve in your face. It is unclear why this happens, but an infection by a virus may lead to it. Most of the time the reason it happens is unknown. SIGNS AND SYMPTOMS  Symptoms can range from mild to severe and can take place over a number of hours. Symptoms may include:  Being unable to:  Raise one or both eyebrows.  Close one or both eyes.  Feel parts of your face (facial numbness).  Drooping of the eyelid and corner of the mouth.  Weakness in the face.  Paralysis of half your face.  Loss of taste.  Sensitivity to loud noises.  Difficulty chewing.  Tearing up of the affected eye.  Dryness in the affected eye.  Drooling.  Pain behind one ear. DIAGNOSIS  Diagnosis of Bell palsy may include:  A medical history and physical exam.  An MRI.  A CT scan.  Electromyography (EMG). This is a test that checks how your nerves are working. TREATMENT  Treatment may include antiviral medicine to help shorten the length of the condition. Sometimes treatment is not needed and the symptoms go away on their own. HOME CARE INSTRUCTIONS   Take medicines only as directed by your health care provider.  Do facial massages and exercises as directed by your health care provider.  If your eye is affected:  Use moisturizing eye drops to prevent drying of your eye as directed by your health care provider.  Protect your eye as directed by your health care provider. SEEK MEDICAL CARE IF:  Your symptoms do not get better or get worse.  You are drooling.  Your eye is red,  irritated, or hurts. SEEK IMMEDIATE MEDICAL CARE IF:   Another part of your body feels weak or numb.  You have difficulty swallowing.  You have a fever along with symptoms of Bell palsy.  You develop neck pain. MAKE SURE YOU:   Understand these instructions.  Will watch your condition.  Will get help right away if you are not doing well or get worse.   This information is not intended to replace advice given to you by your health care provider. Make sure you discuss any questions you have with your health care provider.   Document Released: 09/01/2005 Document Revised: 05/23/2015 Document Reviewed: 12/09/2013 Elsevier Interactive Patient Education 2016 [employer].  Please take the prednisone and the valtrex as prescribed by the ER doctor (take the prednisone tablets all at the same time, once a day). We will contact you with the urine culture results and let you know if you can stop the antibiotic. Continue it for now, since it sounds like the urinary symptoms (frequency) that you had yesterday has improved since taking the antibiotic.  Try and tape the eye closed at night to help protect it. Use the drops frequently throughout the day, and schedule an appointment with your eye doctor if you develop any redness, pain, decrease in vision or other irritation or discomfort in the eye.

## 2016-07-10 NOTE — Telephone Encounter (Signed)
I called the pt to r/s her for 11/6 at 12p.

## 2016-07-11 ENCOUNTER — Encounter: Payer: Self-pay | Admitting: Family Medicine

## 2016-07-12 LAB — URINE CULTURE: Culture: >=100000 — AB

## 2016-07-13 ENCOUNTER — Telehealth (HOSPITAL_BASED_OUTPATIENT_CLINIC_OR_DEPARTMENT_OTHER): Payer: Self-pay | Admitting: Emergency Medicine

## 2016-07-13 NOTE — Telephone Encounter (Signed)
Post ED Visit - Positive Culture Follow-up  Culture report reviewed by antimicrobial stewardship pharmacist:  []  Elenor Quinones, Pharm.D. []  Heide Guile, Pharm.D., BCPS []  Parks Neptune, Pharm.D. []  Alycia Rossetti, Pharm.D., BCPS []  [place], Pharm.D., BCPS, AAHIVP []  Legrand Como, Pharm.D., BCPS, AAHIVP []  Milus Glazier, Pharm.D. []  Stephens November, [place].D. Salome Arnt PharmD  Positive urine culture Treated with ciprofloxacin, organism sensitive to the same and no further patient follow-up is required at this time.  Hazle Nordmann 07/13/2016, 10:31 AM

## 2016-07-14 ENCOUNTER — Telehealth: Payer: Self-pay | Admitting: Family Medicine

## 2016-07-14 ENCOUNTER — Ambulatory Visit (INDEPENDENT_AMBULATORY_CARE_PROVIDER_SITE_OTHER): Payer: Medicare Other | Admitting: Family Medicine

## 2016-07-14 ENCOUNTER — Encounter: Payer: Self-pay | Admitting: Family Medicine

## 2016-07-14 VITALS — BP 120/64 | HR 76 | Ht 67.0 in | Wt 196.0 lb

## 2016-07-14 DIAGNOSIS — R42 Dizziness and giddiness: Secondary | ICD-10-CM

## 2016-07-14 DIAGNOSIS — N390 Urinary tract infection, site not specified: Secondary | ICD-10-CM

## 2016-07-14 DIAGNOSIS — G510 Bell's palsy: Secondary | ICD-10-CM | POA: Diagnosis not present

## 2016-07-14 LAB — POCT URINALYSIS DIPSTICK
BILIRUBIN UA: NEGATIVE
Blood, UA: NEGATIVE
Glucose, UA: NEGATIVE
KETONES UA: NEGATIVE
LEUKOCYTES UA: NEGATIVE
NITRITE UA: NEGATIVE
PH UA: 5
Spec Grav, UA: >=1.03
Urobilinogen, UA: NEGATIVE

## 2016-07-14 NOTE — Telephone Encounter (Signed)
On Valtrex, Cipro and prednisone.  Same meds as when she was seen here last week.  If feeling that bad (and was fine when she was here), needs eval today

## 2016-07-14 NOTE — Progress Notes (Signed)
Chief Complaint  Patient presents with  . Allergic Reaction    thinks she was having an allergic reaction to one of her medication (new) .Was experiencing rapid heartbeat, fatigue, weakness, nausea, vomiting, dizziness and says that a blood vessel in her right temple was swelling. Took her meds Friday and Saturday-stopped Saturday. Feeling better now except for still feeling weak. Also states that she saw the [facility] people in the past and they said she had liver issues in the past and we never addressed.    Patient presents after episode of vertigo, nausea, weakness.  She felt it might be related to the three new medications she was put on.  She started the medications Wednesday evening after ER visit, and stopped them Saturday evening after being so ill.  She continued to feel weak and sick on Sunday.  Feeling much better today.  Bottles brought in: 7/14 tablets of cipro in bottle--correct if taken both doses on Saturday 12/21 valtrex tablets left in bottle (9 gone)--adds up , missing 3rd dose on Saturday 8/20 prednisone left--took 12 (took 40mg  x 3 days)  She had urinary symptoms just the day of ER visit, in the ER.  Urinary symptoms completely resolved.  Urine culture was + for e.coli, sensitive to the cipro she was treated with.  She continues to have some right ear pain. No further rash or sores.  Facial weakness is unchanged per pt--still unable to closer her right eye; using eyedrops frequently during the day, taping eye closed in the evening. Denies any eye irritation or decrease in vision.  She has some trouble eating/drinking related to the weakness of her mouth, but has been trying her best.  She had some questions about her "liver" from her AWV done by [facility].  Notes from AWV reviewed in detail (only concern was fall risk); no mention of liver problem.  Review of last 2 chem panels reviewed--mildly elevated Tbili on last check, normal a few months prior, otherwise  fine--reassured.  PMH, PSH, SH reviewed.  Outpatient Encounter Prescriptions as of 07/14/2016  Medication Sig Note  . Acetaminophen (TYLENOL ARTHRITIS PAIN PO) Take 2 tablets by mouth 2 (two) times daily.    Marland Kitchen amLODipine (NORVASC) 5 MG tablet Take 1 tablet (5 mg total) by mouth daily.   Marland Kitchen Apoaequorin (PREVAGEN PO) Take 1 tablet by mouth daily.   . bacitracin ophthalmic ointment Place 1 application into the right eye 2 (two) times daily as needed (crusting).   . Calcium Carbonate-Vitamin D (CALTRATE 600+D PO) Take 2 tablets by mouth daily.   . carvedilol (COREG) 25 MG tablet TAKE 1 & 1/2 TABLETS BY MOUTH TWICE A DAY   . cephALEXin (KEFLEX) 500 MG capsule Take 1 tablet prior to dental procedure, take second tablet evening after dental procedure   . clopidogrel (PLAVIX) 75 MG tablet Take 1 tablet (75 mg total) by mouth daily.   . Coenzyme Q10 (CO Q 10 PO) Take 1 tablet by mouth daily.    . Evening Primrose Oil CAPS Take 1 capsule by mouth daily.     Marland Kitchen ezetimibe (ZETIA) 10 MG tablet Take 1 tablet (10 mg total) by mouth daily.   Marland Kitchen gabapentin (NEURONTIN) 300 MG capsule Take 1 capsule (300 mg total) by mouth at bedtime.   Marland Kitchen glucose blood test strip Use as instructed   . hydrochlorothiazide (HYDRODIURIL) 12.5 MG tablet TAKE 1 TABLET BY MOUTH EVERY DAY   . losartan (COZAAR) 100 MG tablet TAKE 1 TABLET (100 MG TOTAL) BY  MOUTH 2 (TWO) TIMES DAILY.   Marland Kitchen Misc Natural Products (OSTEO BI-FLEX ADV TRIPLE ST PO) Take 2 capsules by mouth daily.   . Multiple Vitamin (MULTIVITAMIN) tablet Take 1 tablet by mouth daily. 04/18/2013: Takes a vitamin pack for diabetics  . MYRBETRIQ 50 MG TB24 tablet Take 50 mg by mouth daily. 08/06/2015: Started a couple of months ago by Dr. Wendy Poet  . Needles & Syringes MISC 1 each by Does not apply route once. Pt uses freestyles lancets   . Omega-3 Fatty Acids (FISH OIL) 1000 MG CAPS Take 1 capsule by mouth 2 (two) times daily. 02/14/2016: 2000mg  of [place] oil daily  . polyvinyl  alcohol (LIQUIFILM TEARS) 1.4 % ophthalmic solution Place 1 drop into the right eye every 2 (two) hours while awake.   . potassium chloride (KLOR-CON M10) 10 MEQ tablet Take 1 tablet (10 mEq total) by mouth daily.   . solifenacin (VESICARE) 5 MG tablet Take 5 mg by mouth daily.    Marland Kitchen Specialty Vitamins Products ([employer]S HAIR/SKIN/NAILS PO) Take 2 each by mouth daily.   . ciprofloxacin (CIPRO) 500 MG tablet Take 1 tablet (500 mg total) by mouth 2 (two) times daily. (Patient not taking: Reported on 07/14/2016)   . predniSONE (DELTASONE) 10 MG tablet Take 4 tablets (40 mg total) by mouth daily. (Patient not taking: Reported on 07/14/2016)   . valACYclovir (VALTREX) 1000 MG tablet Take 1 tablet (1,000 mg total) by mouth 3 (three) times daily. (Patient not taking: Reported on 07/14/2016)    No facility-administered encounter medications on file as of 07/14/2016.    Stopped taking cipro, valtrex and prednisone on Saturday (took 1 dose on Saturday, then stopped)  Allergies  Allergen Reactions  . Codeine Nausea And Vomiting  . Contrast Media [Iodinated Diagnostic Agents] Nausea And Vomiting  . Morphine And Related Nausea And Vomiting  . Statins Other (See Comments)    lethargic  . Toprol Xl [Metoprolol Tartrate]     Symptoms of heart attack, per pt, told not to take it again (?chest pain)  . Clindamycin/Lincomycin Itching and Rash  . Penicillins Hives, Swelling and Rash   ROS: no fever, chills, URI symptoms.  +ear pain and facial weakness per HPI.  She reports noting a bulging vein at her temple over the weekend; she checked BP and it was normal.  Denies headache.  Dizziness she had was described as vertigo.  Denies lightheadedness or syncope.  Denies chest pain, shortness of breath, edema. Denies bleeding, bruising.  See HPI.   PHYSICAL EXAM:  BP 120/64 (BP Location: Right Arm, Patient Position: Sitting, Cuff Size: Normal)   Pulse 76   Ht 5\' 7"  (1.702 m)   Wt 196 lb (88.9 kg)   BMI 30.70 kg/m    Well developed, elderly female, slightly anxious but otherwise in no distress HEENT: conjunctiva and sclera are clear.  TM and EAC normal on the right.  Crusting and scabbed vesicles in same location as last visit (very external on ear, not in canal).  No erythema or soft tissue swelling.  Temple area looks completely normal--nontender, no bulging vein. OP is clear.  Tongue is midline. No erythema.  Mucus membranes minimally dry. Neck: no lymphadenopathy or mass Heart: regular rate and rhythm Lungs: clear bilaterally Psych: slightly anxious; normal mood, affect, hygiene and grooming Neuro: alert and oriented.  Facial weakness--right mouth droop, unable to close right eye.  Normal finger to nose. Uses cane to walk, but otherwise normal gait.    Urine dip:  Trace protein, otherwise normal. SG >= 1.030  ASSESSMENT/PLAN:  Facial paralysis/Bells palsy - restart Valtrex. Can hold off on Prednisone for now, as that may have contributed to her side effects (she is diabetic). Hold prednisone  Urinary tract infection without hematuria, site unspecified - s/p 3.5d of Cipro--not what I would recommend for full course at her age, but sx resolved, normal u/a. remain off ABX - Plan: POCT Urinalysis Dipstick  Vertigo - x2d, now resolved. Unsure which med contributing (if related to med).   Recheck urine today--since urinary symptoms resolved, and urine dip normal, stay off Cipro.  If symptoms recur, change to different antibiotic--sensitive to all.  (macrobid or septra)    Restart the Valtrex (valacyclovir) today.  Take 1 pill three times daily until gone. You may stop taking the cipro. You may stop taking the prednisone also.  Continue the frequent use of eyedrops, and taping the eye closed for as long as you have trouble closing it yourself.

## 2016-07-14 NOTE — Telephone Encounter (Signed)
Pt coming in for appt

## 2016-07-14 NOTE — Patient Instructions (Signed)
  Restart the Valtrex (valacyclovir) today.  Take 1 pill three times daily until gone. You may stop taking the cipro. You may stop taking the prednisone also.  Continue the frequent use of eyedrops, and taping the eye closed for as long as you have trouble closing it yourself.  We are rechecking the urine today to see if you need any further antibiotics. You did have a urinary infection that the Cipro should have treated--we usually treat for longer.  If your urine looks okay today, we will hold off on any further antibiotics.  If you develop any recurrent urinary symptoms, please contact us so that we can start a different antibiotic.

## 2016-07-14 NOTE — Telephone Encounter (Signed)
Pt called she is having an allergic reaction to the meds the hospital gave her.  Dry heaves, vomitting, nauseated, unbelievable dizziness.  Please advise her what to do [phone]

## 2016-07-21 ENCOUNTER — Encounter: Payer: Self-pay | Admitting: Neurology

## 2016-07-21 ENCOUNTER — Ambulatory Visit (INDEPENDENT_AMBULATORY_CARE_PROVIDER_SITE_OTHER): Payer: Medicare Other | Admitting: Neurology

## 2016-07-21 VITALS — BP 123/67 | HR 61 | Ht 67.0 in | Wt 199.0 lb

## 2016-07-21 DIAGNOSIS — Z8673 Personal history of transient ischemic attack (TIA), and cerebral infarction without residual deficits: Secondary | ICD-10-CM

## 2016-07-21 DIAGNOSIS — G118 Other hereditary ataxias: Secondary | ICD-10-CM

## 2016-07-21 DIAGNOSIS — R269 Unspecified abnormalities of gait and mobility: Secondary | ICD-10-CM | POA: Diagnosis not present

## 2016-07-21 DIAGNOSIS — G1119 Other early-onset cerebellar ataxia: Secondary | ICD-10-CM

## 2016-07-21 DIAGNOSIS — G111 Early-onset cerebellar ataxia: Secondary | ICD-10-CM

## 2016-07-21 HISTORY — DX: Unspecified abnormalities of gait and mobility: R26.9

## 2016-07-21 HISTORY — DX: Other early-onset cerebellar ataxia: G11.19

## 2016-07-21 NOTE — Progress Notes (Signed)
Reason for visit: Ramsay Hunt syndrome  ITATY STRENGTH is an 82 y.o. female  History of present illness:  Ms. Dungee is an 82 year old right-handed white female with a history of cerebrovascular disease and a history of a gait disorder. The patient has developed a right facial nerve palsy associated with a Ramsay Hunt syndrome around 07/09/2016. The patient went to the emergency room as she thought she was having a stroke. MRI of the brain did not show any acute changes. The patient has extensive small vessel disease. The patient was found to have a shingles outbreak involving the right ear canal, the diagnosis of Ramsay Hunt syndrome was made. The patient has had no change in taste, she does have some alteration in hearing in her right ear. She has significant weakness on the right face. She denies any new numbness or weakness elsewhere on the body. She has chronic left foot numbness. She walks with a cane. She has significant mobility issues with the right shoulder associated with a frozen shoulder syndrome. She comes to this office for an evaluation. She has been using eyedrops in her right eye.  Past Medical History:  Diagnosis Date  . Arthritis    right knee, left wrist  . Asthma    related to allergies when working; resolved  . Breast cancer ([place]) 7/08 Inrasine ductal L breast  . Colon polyps    4/09--adenomatous. 04/2010--normal.  Repeat 2014  . DDD (degenerative disc disease), lumbar 4/06  . Diabetes mellitus    diet controlled  . Diabetic retinopathy   . DJD (degenerative joint disease)   . Dyslipidemia   . Endometrial cancer ([facility]) hx  . Gout   . Hepatitis A history(in [place]-1950's)  . History of stroke 10/24/2013  . Hypertension   . Incontinence   . Membranous glomerulonephropathy    [name]  . Mini stroke [facility]) DrWillis  . Obesity, unspecified   . Osteopenia L hip, 4/06  . 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 Skin cancer    [name]  . Sliding  hiatal hernia small sliding hiatal hernia  . Wears glasses     Past Surgical History:  Procedure Laterality Date  . ABDOMINAL HYSTERECTOMY     endometrial cancer  . APPENDECTOMY    . BREAST LUMPECTOMY     left  . BREAST LUMPECTOMY  8/08 L Breast ([name])  . CATARACT EXTRACTION    . COLONOSCOPY  8/08 [name] (normal)  . kidney stones    . left middle finger    . REPLACEMENT TOTAL KNEE  Left  . SKIN CANCER EXCISION    . TOTAL KNEE ARTHROPLASTY     left    Family History  Problem Relation Age of Onset  . Heart attack Father   . Diabetes Father   . Heart attack Mother   . Hypertension Mother   . Diabetes Mother   . Hypertension Brother   . Stroke Brother   . Melanoma Brother   . Diabetes Brother   . Hypertension Son   . Cancer Neg Hx     Social history:  reports that she has never smoked. She has never used smokeless tobacco. She reports that she does not drink alcohol or use drugs.    Allergies  Allergen Reactions  . Ciprofloxacin Nausea And Vomiting  . Codeine Nausea And Vomiting  . Contrast Media [Iodinated Diagnostic Agents] Nausea And Vomiting  . Morphine And Related Nausea And Vomiting  . Statins  Other (See Comments)    lethargic  . Toprol Xl [Metoprolol Tartrate]     Symptoms of heart attack, per pt, told not to take it again (?chest pain)  . Clindamycin/Lincomycin Itching and Rash  . Penicillins Hives, Swelling and Rash  . Prednisone Palpitations    Medications:  Prior to Admission medications   Medication Sig Start Date End Date Taking? Authorizing Provider  Acetaminophen (TYLENOL ARTHRITIS PAIN PO) Take 2 tablets by mouth 2 (two) times daily.    Yes Historical Provider, MD  amLODipine (NORVASC) 5 MG tablet Take 1 tablet (5 mg total) by mouth daily. 02/14/16  Yes Rita Ohara, MD  Apoaequorin (PREVAGEN PO) Take 1 tablet by mouth daily.   Yes Historical Provider, MD  bacitracin ophthalmic ointment Place 1 application into the right eye 2 (two) times  daily as needed (crusting). 02/14/16  Yes Rita Ohara, MD  Calcium Carbonate-Vitamin D (CALTRATE 600+D PO) Take 2 tablets by mouth daily.   Yes Historical Provider, MD  carvedilol (COREG) 25 MG tablet TAKE 1 & 1/2 TABLETS BY MOUTH TWICE A DAY 02/14/16  Yes Rita Ohara, MD  cephALEXin (KEFLEX) 500 MG capsule Take 1 tablet prior to dental procedure, take second tablet evening after dental procedure 08/06/15  Yes Rita Ohara, MD  ciprofloxacin (CIPRO) 500 MG tablet Take 1 tablet (500 mg total) by mouth 2 (two) times daily. 07/09/16  Yes Fredia Sorrow, MD  clopidogrel (PLAVIX) 75 MG tablet Take 1 tablet (75 mg total) by mouth daily. 10/23/15  Yes Kathrynn Ducking, MD  Coenzyme Q10 (CO Q 10 PO) Take 1 tablet by mouth daily.    Yes Historical Provider, MD  Evening Primrose Oil CAPS Take 1 capsule by mouth daily.     Yes Historical Provider, MD  ezetimibe (ZETIA) 10 MG tablet Take 1 tablet (10 mg total) by mouth daily. 02/14/16  Yes Rita Ohara, MD  gabapentin (NEURONTIN) 300 MG capsule Take 1 capsule (300 mg total) by mouth at bedtime. 06/03/16  Yes Rita Ohara, MD  glucose blood test strip Use as instructed 01/29/15  Yes Rita Ohara, MD  hydrochlorothiazide (HYDRODIURIL) 12.5 MG tablet TAKE 1 TABLET BY MOUTH EVERY DAY 03/10/16  Yes Rita Ohara, MD  losartan (COZAAR) 100 MG tablet TAKE 1 TABLET (100 MG TOTAL) BY MOUTH 2 (TWO) TIMES DAILY. 03/10/16  Yes Rita Ohara, MD  Misc Natural Products (OSTEO BI-FLEX ADV TRIPLE ST PO) Take 2 capsules by mouth daily.   Yes Historical Provider, MD  Multiple Vitamin (MULTIVITAMIN) tablet Take 1 tablet by mouth daily.   Yes Historical Provider, MD  Needles & Syringes MISC 1 each by Does not apply route once. Pt uses freestyles lancets 01/11/13  Yes Rita Ohara, MD  Omega-3 Fatty Acids (FISH OIL) 1000 MG CAPS Take 1 capsule by mouth 2 (two) times daily.   Yes Historical Provider, MD  polyvinyl alcohol (LIQUIFILM TEARS) 1.4 % ophthalmic solution Place 1 drop into the right eye every 2 (two) hours  while awake. 07/09/16  Yes Fredia Sorrow, MD  potassium chloride (KLOR-CON M10) 10 MEQ tablet Take 1 tablet (10 mEq total) by mouth daily. 02/14/16  Yes Rita Ohara, MD  solifenacin (VESICARE) 5 MG tablet Take 5 mg by mouth daily.    Yes Historical Provider, MD  Specialty Vitamins Products (CVS HAIR/SKIN/NAILS PO) Take 2 each by mouth daily.   Yes Historical Provider, MD  valACYclovir (VALTREX) 1000 MG tablet Take 1 tablet (1,000 mg total) by mouth 3 (three) times daily. 07/09/16 07/23/16  Yes Fredia Sorrow, MD    ROS:  Out of a complete 14 system review of symptoms, the patient complains only of the following symptoms, and all other reviewed systems are negative.  Ear discharge Eye redness, blurred vision Excessive thirst Incontinence of the bladder Bruising easily Numbness, weakness, facial drooping  Blood pressure 123/67, pulse 61, height 5\' 7"  (1.702 m), weight 199 lb (90.3 kg).  Physical Exam  General: The patient is alert and cooperative at the time of the examination.  Skin: 1+ edema at the ankles is noted bilaterally.   Neurologic Exam  Mental status: The patient is alert and oriented x 3 at the time of the examination. The patient has apparent normal recent and remote memory, with an apparently normal attention span and concentration ability.   Cranial nerves: Facial symmetry is not present. The patient has a significant peripheral right facial weakness. Speech is normal, no aphasia or dysarthria is noted. Extraocular movements are full. Visual fields are full.  Motor: The patient has good strength in all 4 extremities, the patient has incomplete abduction of the right arm.  Sensory examination: Soft touch sensation is symmetric on the face, arms, and legs.  Coordination: The patient has good finger-nose-finger bilaterally, but she has difficulty performing heel-to-shin bilaterally.  Gait and station: The patient has a slightly wide-based gait, the patient has a limping  gait on the right leg. Tandem gait was not attempted. Romberg is negative. No drift is seen.  Reflexes: Deep tendon reflexes are symmetric.   Assessment/Plan:  1. Right Ramsay Hunt syndrome  2. History of cerebrovascular disease  3. Gait disturbance  The patient has had a Ramsay Hunt syndrome, she has been treated with prednisone and Valtrex. The patient will need to protect the cornea on the right eye, she is to use Lacri-Lube in the evening hours, artificial tears in the daylight hours. She will patch the right eye at night. She will follow-up in 3 months.  Jill Alexanders MD 07/21/2016 1:06 PM  [facility] [address] [place], [place] [address]  Phone [phone] Fax [phone]

## 2016-08-02 ENCOUNTER — Other Ambulatory Visit: Payer: Self-pay | Admitting: Family Medicine

## 2016-08-05 ENCOUNTER — Ambulatory Visit: Payer: Medicare Other | Admitting: Neurology

## 2016-08-16 NOTE — Progress Notes (Signed)
Chief Complaint  Patient presents with  . Diabetes    fasting med check.    Patient presents for 6 month follow up of chronic medical issues.  Bell's Palsy/Ramsey Hunt syndrome--she completed medical treatment, and saw Dr. Jannifer Franklin in follow-up last month, with plans to follow-up in February. Her eye is now closing much better, and the facial weakness is improving.  She notices she still lisps a little, but is back to singing in the choir. She reports having recently noted some blisters/scabbin in her nose on the right, had some bleeding this morning. It seems better today. Denies runny nose, congestion.   Diabetes: Controlled by diet, she has never taken medications. She states that sugars are usually running "in the normal range". She forgot to bring her book. Checks sugars about once a week. Denies polydipsia, polyuria (drinks a lot, no changes). She has tingling in her left foot since her minor stroke, unchanged. She had gotten a cortisone shot once from podiatrist, which temporarily helped. No paresthesias elsewhere. Sees eye doctor once yearly, in January.  Normal microalbumin/Cr ratio in June.  She no longer goes to water aerobics. She tries to remain active, walking more in the supermarkets (not riding cart).  She got an injection at the right shoulder about 2 weeks ago (orthopedist). This has given her some pain relief. She has frozen shoulder.   HTN:  BP's at home are "normal"--she didn't bring in the list today. Denies side effects of her medications. Denies chest pain, headaches, dizziness, shortness of breath, edema, muscle cramps (sometimes at night). Denies claudication.  Left breast cancer: She "graduated" from going to the oncologist, after going for 7 years. Last visit with Dr. Jana Hakim was in September 2015. She continues to get yearly mammograms at the [facility] (05/2016), and see Dr. Nori Riis yearly for her breast (and pelvic) exams--saw him last week. She has prolapse  of her bladder, and she tried pessaries, but they fall out, and Dr. Nori Riis didn't want to operate. She gets occasional UTI's which she relates to the prolapse. Currently denies any urinary symptoms.  Hyperlipidemia-- She reports that the krill oil and all fish oils have red dye, which makes her itch. She can tolerate taking 500mg  daily, but not higher. Trying to follow a low fat diet. She limits her carbs, sweets, fried foods. Snacks on cheese and crackers. Compliant with Zetia.  Lab Results  Component Value Date   CHOL 137 02/14/2016   HDL 37 (L) 02/14/2016   LDLCALC 50 02/14/2016   TRIG 251 (H) 02/14/2016   CHOLHDL 3.7 02/14/2016   She has been taking Prevagen for about 2-3 years to help with memory, and she finds it effective. She notices improvement--easier to recall names. She is very busy, but keeps up with everything by keeping an organized, detailed calendar. Denies any memory concerns--doesn't lose keys, car, get lost driving.  H/o CVA, on Plavix. As stated above, she last saw Dr. Jannifer Franklin las month. She has residual numbness of L foot. Also felt to have a mild peripheral neuropathy, possibly related to her diabetes (diet controlled).  She remains on gabapentin (started by Dr. Jana Hakim in the past).  She denies any pain in her foot, just a mild tingling.  She remains on Plavix. Denies any bleeding or side effects, other than on her hands (bleeding/bruising).  Gout: She hasn't had a flare in well over 2 years. She previously took allopurinol, but it was stopped over a year ago. No flares since stopping it.  Lab Results  Component Value Date   LABURIC 4.0 02/14/2016   Osteopenia:  Vitamin D level was normal at 38 last year. Last DEXA was 04/2014, mild osteopenia noted, but worsening compared to baseline and prior. Left FOREARM T Score:  -1.1; Left FEMUR T-Score: -1.4 ASSESSMENT: Patient's diagnostic category is low bone mass by WHO Criteria. COMPARISON: Statistically significant  worsening bone mineral density when compared with previous and baseline.  She recently saw [name] recently and was told that her kidneys are fine.  PMH, PSH, SH reviewed  Outpatient Encounter Prescriptions as of 08/18/2016  Medication Sig Note  . Acetaminophen (TYLENOL ARTHRITIS PAIN PO) Take 2 tablets by mouth 2 (two) times daily.    Marland Kitchen amLODipine (NORVASC) 5 MG tablet Take 1 tablet (5 mg total) by mouth daily.   Marland Kitchen Apoaequorin (PREVAGEN PO) Take 1 tablet by mouth daily.   . Calcium Carbonate-Vitamin D (CALTRATE 600+D PO) Take 2 tablets by mouth daily.   . carvedilol (COREG) 25 MG tablet TAKE 1 & 1/2 TABLETS BY MOUTH TWICE A DAY   . clopidogrel (PLAVIX) 75 MG tablet Take 1 tablet (75 mg total) by mouth daily.   . Coenzyme Q10 (CO Q 10 PO) Take 1 tablet by mouth daily.    . Evening Primrose Oil CAPS Take 1 capsule by mouth daily.     Marland Kitchen ezetimibe (ZETIA) 10 MG tablet Take 1 tablet (10 mg total) by mouth daily.   Marland Kitchen gabapentin (NEURONTIN) 300 MG capsule Take 1 capsule (300 mg total) by mouth at bedtime.   Marland Kitchen glucose blood test strip Use as instructed   . hydrochlorothiazide (HYDRODIURIL) 12.5 MG tablet Take 1 tablet (12.5 mg total) by mouth daily.   Marland Kitchen losartan (COZAAR) 100 MG tablet TAKE 1 TABLET (100 MG TOTAL) BY MOUTH 2 (TWO) TIMES DAILY.   Marland Kitchen Misc Natural Products (OSTEO BI-FLEX ADV TRIPLE ST PO) Take 2 capsules by mouth daily.   . Multiple Vitamin (MULTIVITAMIN) tablet Take 1 tablet by mouth daily. 04/18/2013: Takes a vitamin pack for diabetics  . Needles & Syringes MISC 1 each by Does not apply route once. Pt uses freestyles lancets   . Omega-3 Fatty Acids (FISH OIL) 1000 MG CAPS Take 1 capsule by mouth 2 (two) times daily. 02/14/2016: 2000mg  of Krill oil daily  . polyvinyl alcohol (LIQUIFILM TEARS) 1.4 % ophthalmic solution Place 1 drop into the right eye every 2 (two) hours while awake.   . potassium chloride (KLOR-CON M10) 10 MEQ tablet Take 1 tablet (10 mEq total) by mouth daily.   .  solifenacin (VESICARE) 5 MG tablet Take 5 mg by mouth daily.    Marland Kitchen Specialty Vitamins Products ([employer] HAIR/SKIN/NAILS PO) Take 2 each by mouth daily.   . [DISCONTINUED] amLODipine (NORVASC) 5 MG tablet Take 1 tablet (5 mg total) by mouth daily.   . [DISCONTINUED] carvedilol (COREG) 25 MG tablet TAKE 1 & 1/2 TABLETS BY MOUTH TWICE A DAY   . [DISCONTINUED] ezetimibe (ZETIA) 10 MG tablet Take 1 tablet (10 mg total) by mouth daily.   . [DISCONTINUED] hydrochlorothiazide (HYDRODIURIL) 12.5 MG tablet TAKE 1 TABLET BY MOUTH EVERY DAY   . [DISCONTINUED] losartan (COZAAR) 100 MG tablet TAKE 1 TABLET (100 MG TOTAL) BY MOUTH 2 (TWO) TIMES DAILY.   . cephALEXin (KEFLEX) 500 MG capsule Take 1 tablet prior to dental procedure, take second tablet evening after dental procedure (Patient not taking: Reported on 08/18/2016)   . [DISCONTINUED] bacitracin ophthalmic ointment Place 1 application into the  right eye 2 (two) times daily as needed (crusting).   . [DISCONTINUED] carvedilol (COREG) 25 MG tablet TAKE 1 & 1/2 TABLETS BY MOUTH TWICE A DAY   . [DISCONTINUED] ciprofloxacin (CIPRO) 500 MG tablet Take 1 tablet (500 mg total) by mouth 2 (two) times daily.    No facility-administered encounter medications on file as of 08/18/2016.    Allergies  Allergen Reactions  . Ciprofloxacin Nausea And Vomiting  . Codeine Nausea And Vomiting  . Contrast Media [Iodinated Diagnostic Agents] Nausea And Vomiting  . Morphine And Related Nausea And Vomiting  . Statins Other (See Comments)    lethargic  . Toprol Xl [Metoprolol Tartrate]     Symptoms of heart attack, per pt, told not to take it again (?chest pain)  . Clindamycin/Lincomycin Itching and Rash  . Penicillins Hives, Swelling and Rash  . Prednisone Palpitations    ROS: denies fever, chills, URI symptoms, cough, shortness of breath, chest pain, headaches, dizziness, GI complaints, dysuria, hematuria, depression, insomnia or other complaints, except as noted above in  HPI. Arthritis in knees and hip, uses a cane. Some knee pain with walking. Right frozen shoulder (pain improved s/p injection).   PHYSICAL EXAM:  BP (!) 144/76 (BP Location: Left Arm, Patient Position: Sitting, Cuff Size: Normal)   Pulse 72   Ht 5\' 7"  (1.702 m)   Wt 202 lb 12.8 oz (92 kg)   BMI 31.76 kg/m   134/64 on repeat by MD  Very pleasant, talkative, well-appearing female in no distress HEENT: PERRL; very mild injection on the right; while the eyelid now closes completely, it is a little slower to close. OP clear.TM and EAC is normal.  Nasal mucosa is normal, very small scab/ulceration noted at the inside of the distal tip of the right nares, with no surrounding erythema or swelling.  Mucosa is otherwise normal. Some asymmetry of the lip remains, but improved motion with smile on the right.   Neck: no lymphadenopathy, thyromegaly or mass, no bruit Heart: regular rate and rhythm Lungs: clear bilaterally Abdomen: soft, nontender, no mass Extremities: no edema. Decreased pulses bilaterally, but palpable (unchanged) Normal diabetic foot exam.  Skin: senile purpura and ecchymoses on forearms and hands Neuro: alert and oriented, cranial nerves as per HEENT above; improving. Normal gait, uses cane. Psych: normal mood, affect, hygiene and grooming  Lab Results  Component Value Date   HGBA1C 5.7 08/18/2016    ASSESSMENT/PLAN:  Facial paralysis/Bells palsy - significant improvement noted. continue drops/ointment to right eye  DM (diabetes mellitus), type 2 with peripheral vascular complications ([place]) - controlled with diet. Encouraged 150 mins of exercise each week - Plan: HgB A1c  Essential hypertension, benign - controlled - Plan: amLODipine (NORVASC) 5 MG tablet, losartan (COZAAR) 100 MG tablet, hydrochlorothiazide (HYDRODIURIL) 12.5 MG tablet, carvedilol (COREG) 25 MG tablet  Pure hypercholesterolemia - due for recheck; previously LDL controlled with zetia, but TG elevated.  Diet reviewed - Plan: Lipid panel, ezetimibe (ZETIA) 10 MG tablet  Idiopathic gout of left foot, unspecified chronicity - resolved; doing well ,even off allopurinol  PAD (peripheral artery disease) (HCC) - stable, asymptomatic  Medication monitoring encounter  Osteopenia, unspecified location - encouraged calcium, vitamin D, weight-bearing exercise. Repeat DEXA 07/2017 with next mammogram - Plan: DG Bone Density  Vitamin D deficiency - continue daily supplements  Other mononeuropathy  Ramsay Hunt cerebellar syndrome (HCC)  Hypertriglyceridemia - diet reviewed.  recheck lipids today (fasting) - Plan: Lipid panel    Lipids, A1c DEXA with next  mammo (07/2017)

## 2016-08-18 ENCOUNTER — Encounter: Payer: Self-pay | Admitting: Family Medicine

## 2016-08-18 ENCOUNTER — Ambulatory Visit (INDEPENDENT_AMBULATORY_CARE_PROVIDER_SITE_OTHER): Payer: Medicare Other | Admitting: Family Medicine

## 2016-08-18 VITALS — BP 134/64 | HR 72 | Ht 67.0 in | Wt 202.8 lb

## 2016-08-18 DIAGNOSIS — G510 Bell's palsy: Secondary | ICD-10-CM

## 2016-08-18 DIAGNOSIS — E1151 Type 2 diabetes mellitus with diabetic peripheral angiopathy without gangrene: Secondary | ICD-10-CM | POA: Diagnosis not present

## 2016-08-18 DIAGNOSIS — E559 Vitamin D deficiency, unspecified: Secondary | ICD-10-CM

## 2016-08-18 DIAGNOSIS — I10 Essential (primary) hypertension: Secondary | ICD-10-CM | POA: Diagnosis not present

## 2016-08-18 DIAGNOSIS — Z5181 Encounter for therapeutic drug level monitoring: Secondary | ICD-10-CM | POA: Diagnosis not present

## 2016-08-18 DIAGNOSIS — E781 Pure hyperglyceridemia: Secondary | ICD-10-CM | POA: Diagnosis not present

## 2016-08-18 DIAGNOSIS — G588 Other specified mononeuropathies: Secondary | ICD-10-CM | POA: Diagnosis not present

## 2016-08-18 DIAGNOSIS — I739 Peripheral vascular disease, unspecified: Secondary | ICD-10-CM

## 2016-08-18 DIAGNOSIS — G1119 Other early-onset cerebellar ataxia: Secondary | ICD-10-CM

## 2016-08-18 DIAGNOSIS — G118 Other hereditary ataxias: Secondary | ICD-10-CM

## 2016-08-18 DIAGNOSIS — G111 Early-onset cerebellar ataxia: Secondary | ICD-10-CM

## 2016-08-18 DIAGNOSIS — E7800 Pure hypercholesterolemia, unspecified: Secondary | ICD-10-CM | POA: Diagnosis not present

## 2016-08-18 DIAGNOSIS — M8580 Other specified disorders of bone density and structure, unspecified site: Secondary | ICD-10-CM

## 2016-08-18 DIAGNOSIS — M10072 Idiopathic gout, left ankle and foot: Secondary | ICD-10-CM

## 2016-08-18 LAB — LIPID PANEL
CHOLESTEROL: 145 mg/dL (ref <200)
HDL: 32 mg/dL — ABNORMAL LOW (ref >50)
LDL Cholesterol: 63 mg/dL (ref <100)
Total CHOL/HDL Ratio: 4.5 Ratio (ref <5.0)
Triglycerides: 250 mg/dL — ABNORMAL HIGH (ref <150)
VLDL: 50 mg/dL — ABNORMAL HIGH (ref <30)

## 2016-08-18 LAB — POCT GLYCOSYLATED HEMOGLOBIN (HGB A1C): HEMOGLOBIN A1C: 5.7

## 2016-08-18 MED ORDER — LOSARTAN POTASSIUM 100 MG PO TABS: ORAL_TABLET | ORAL | 5 refills | Status: DC

## 2016-08-18 MED ORDER — AMLODIPINE BESYLATE 5 MG PO TABS: 5.0000 mg | ORAL_TABLET | Freq: Every day | ORAL | 5 refills | Status: DC

## 2016-08-18 MED ORDER — CARVEDILOL 25 MG PO TABS: ORAL_TABLET | ORAL | 1 refills | Status: DC

## 2016-08-18 MED ORDER — HYDROCHLOROTHIAZIDE 12.5 MG PO TABS: 12.5000 mg | ORAL_TABLET | Freq: Every day | ORAL | 5 refills | Status: DC

## 2016-08-18 MED ORDER — EZETIMIBE 10 MG PO TABS: 10.0000 mg | ORAL_TABLET | Freq: Every day | ORAL | 5 refills | Status: DC

## 2016-08-18 NOTE — Patient Instructions (Signed)
Continue your current medications. Continue to try and avoid sweets and fried foods. Your diabetes is well controlled with diet. Continue to monitor your feet regularly, and to see the eye doctor yearly. Try and get 150 minutes each week of aerobic exercise (in 10-15 minute intervals at the least).  Schedule your bone density for November 2018 when you are due for your next mammogram.

## 2016-09-20 ENCOUNTER — Other Ambulatory Visit: Payer: Self-pay | Admitting: Family Medicine

## 2016-09-20 DIAGNOSIS — I10 Essential (primary) hypertension: Secondary | ICD-10-CM

## 2016-09-22 NOTE — Telephone Encounter (Signed)
Ok to refill the potassium x 6 months.  It looks like the carvedilol was refilled for 6 months on 12/7

## 2016-09-22 NOTE — Telephone Encounter (Signed)
Ok to renew?  

## 2016-10-01 ENCOUNTER — Other Ambulatory Visit: Payer: Self-pay | Admitting: Family Medicine

## 2016-10-10 LAB — HM DIABETES EYE EXAM

## 2016-10-14 ENCOUNTER — Encounter: Payer: Self-pay | Admitting: Neurology

## 2016-10-14 ENCOUNTER — Ambulatory Visit (INDEPENDENT_AMBULATORY_CARE_PROVIDER_SITE_OTHER): Payer: Medicare Other | Admitting: Neurology

## 2016-10-14 VITALS — BP 115/70 | HR 67 | Ht 67.0 in | Wt 202.5 lb

## 2016-10-14 DIAGNOSIS — G588 Other specified mononeuropathies: Secondary | ICD-10-CM | POA: Diagnosis not present

## 2016-10-14 DIAGNOSIS — R269 Unspecified abnormalities of gait and mobility: Secondary | ICD-10-CM

## 2016-10-14 MED ORDER — CLOPIDOGREL BISULFATE 75 MG PO TABS: 75.0000 mg | ORAL_TABLET | Freq: Every day | ORAL | 3 refills | Status: DC

## 2016-10-14 NOTE — Progress Notes (Signed)
Reason for visit: Gait disorder  Kristin Willis is an 83 y.o. female  History of present illness:  Kristin Willis is an 83 year old right-handed white female with a history of a gait disorder, she recently had a Ramsay Hunt syndrome on the right with facial weakness. The patient has had good improvement in the right facial strength. The patient no longer requires eyedrops for the right eye. The patient walks with a cane, she has a lot of degenerative arthritis in the legs, this affects the right hip and the right knee. She has had a total knee replacement on the left. She has not had any falls, she is staying safe. She is on Plavix with a history of cerebrovascular disease. No other new medical issues have come up since last seen. She has a history of diabetes with a peripheral neuropathy, a recent eye examination showed no diabetic retinopathy.  Past Medical History:  Diagnosis Date  . Abnormality of gait 07/21/2016  . Arthritis    right knee, left wrist  . Asthma    related to allergies when working; resolved  . Breast cancer ([place]) 7/08 Inrasine ductal L breast  . Colon polyps    4/09--adenomatous. 04/2010--normal.  Repeat 2014  . DDD (degenerative disc disease), lumbar 4/06  . Diabetes mellitus    diet controlled  . Diabetic retinopathy   . DJD (degenerative joint disease)   . Dyslipidemia   . Endometrial cancer (HCC) hx  . Gout   . Hepatitis A history(in [place]-1950's)  . History of stroke 10/24/2013  . Hypertension   . Incontinence   . Membranous glomerulonephropathy    [name]  . Mini stroke [facility]) DrWillis  . Obesity, unspecified   . Osteopenia L hip, 4/06  . Ramsay Hunt cerebellar syndrome ([place]) 07/21/2016  . Skin cancer    [name]  . Sliding hiatal hernia small sliding hiatal hernia  . Wears glasses     Past Surgical History:  Procedure Laterality Date  . ABDOMINAL HYSTERECTOMY     endometrial cancer  . APPENDECTOMY    . BREAST LUMPECTOMY     left  .  BREAST LUMPECTOMY  8/08 L Breast ([name])  . CATARACT EXTRACTION    . COLONOSCOPY  8/08 [name] (normal)  . kidney stones    . left middle finger    . REPLACEMENT TOTAL KNEE  Left  . SKIN CANCER EXCISION    . TOTAL KNEE ARTHROPLASTY     left    Family History  Problem Relation Age of Onset  . Heart attack Father   . Diabetes Father   . Heart attack Mother   . Hypertension Mother   . Diabetes Mother   . Hypertension Brother   . Stroke Brother   . Melanoma Brother   . Diabetes Brother   . Hypertension Son   . Cancer Neg Hx     Social history:  reports that she has never smoked. She has never used smokeless tobacco. She reports that she does not drink alcohol or use drugs.    Allergies  Allergen Reactions  . Ciprofloxacin Nausea And Vomiting  . Codeine Nausea And Vomiting  . Contrast Media [Iodinated Diagnostic Agents] Nausea And Vomiting  . Morphine And Related Nausea And Vomiting  . Statins Other (See Comments)    lethargic  . Toprol Xl [Metoprolol Tartrate]     Symptoms of heart attack, per pt, told not to take it again (?chest pain)  . Clindamycin/Lincomycin Itching  and Rash  . Penicillins Hives, Swelling and Rash  . Prednisone Palpitations    Medications:  Prior to Admission medications   Medication Sig Start Date End Date Taking? Authorizing Provider  Acetaminophen (TYLENOL ARTHRITIS PAIN PO) Take 2 tablets by mouth 2 (two) times daily.    Yes Historical Provider, MD  amLODipine (NORVASC) 5 MG tablet Take 1 tablet (5 mg total) by mouth daily. 08/18/16  Yes Rita Ohara, MD  Apoaequorin (PREVAGEN PO) Take 1 tablet by mouth daily.   Yes Historical Provider, MD  Calcium Carbonate-Vitamin D (CALTRATE 600+D PO) Take 2 tablets by mouth daily.   Yes Historical Provider, MD  carvedilol (COREG) 25 MG tablet TAKE 1 & 1/2 TABLETS BY MOUTH TWICE A DAY 08/18/16  Yes Rita Ohara, MD  cephALEXin (KEFLEX) 500 MG capsule Take 1 tablet prior to dental procedure, take second tablet  evening after dental procedure 08/06/15  Yes Rita Ohara, MD  clopidogrel (PLAVIX) 75 MG tablet Take 1 tablet (75 mg total) by mouth daily. 10/14/16  Yes Kathrynn Ducking, MD  Coenzyme Q10 (CO Q 10 PO) Take 1 tablet by mouth daily.    Yes Historical Provider, MD  Evening Primrose Oil CAPS Take 1 capsule by mouth daily.     Yes Historical Provider, MD  ezetimibe (ZETIA) 10 MG tablet Take 1 tablet (10 mg total) by mouth daily. 08/18/16  Yes Rita Ohara, MD  gabapentin (NEURONTIN) 300 MG capsule TAKE ONE CAPSULE AT BEDTIME 10/01/16  Yes Rita Ohara, MD  glucose blood test strip Use as instructed 01/29/15  Yes Rita Ohara, MD  hydrochlorothiazide (HYDRODIURIL) 12.5 MG tablet Take 1 tablet (12.5 mg total) by mouth daily. 08/18/16  Yes Rita Ohara, MD  KLOR-CON M10 10 MEQ tablet TAKE 1 TABLET (10 MEQ TOTAL) BY MOUTH DAILY. 09/22/16  Yes Rita Ohara, MD  losartan (COZAAR) 100 MG tablet TAKE 1 TABLET (100 MG TOTAL) BY MOUTH 2 (TWO) TIMES DAILY. 08/18/16  Yes Rita Ohara, MD  Misc Natural Products (OSTEO BI-FLEX ADV TRIPLE ST PO) Take 2 capsules by mouth daily.   Yes Historical Provider, MD  Multiple Vitamin (MULTIVITAMIN) tablet Take 1 tablet by mouth daily.   Yes Historical Provider, MD  Needles & Syringes MISC 1 each by Does not apply route once. Pt uses freestyles lancets 01/11/13  Yes Rita Ohara, MD  Omega-3 Fatty Acids (FISH OIL) 1000 MG CAPS Take 1 capsule by mouth 2 (two) times daily.   Yes Historical Provider, MD  polyvinyl alcohol (LIQUIFILM TEARS) 1.4 % ophthalmic solution Place 1 drop into the right eye every 2 (two) hours while awake. 07/09/16  Yes Fredia Sorrow, MD  solifenacin (VESICARE) 5 MG tablet Take 5 mg by mouth daily.    Yes Historical Provider, MD  Specialty Vitamins Products (CVS HAIR/SKIN/NAILS PO) Take 2 each by mouth daily.   Yes Historical Provider, MD    ROS:  Out of a complete 14 system review of symptoms, the patient complains only of the following symptoms, and all other reviewed systems are  negative.  Light sensitivity Excessive thirst Incontinence of the bladder, frequency of urination Walking difficulty Itching Bruising easily  Blood pressure 115/70, pulse 67, height 5\' 7"  (1.702 m), weight 202 lb 8 oz (91.9 kg).  Physical Exam  General: The patient is alert and cooperative at the time of the examination.  Skin: No significant peripheral edema is noted.   Neurologic Exam  Mental status: The patient is alert and oriented x 3 at the time of  the examination. The patient has apparent normal recent and remote memory, with an apparently normal attention span and concentration ability.   Cranial nerves: Facial symmetry is present. The interpalpebral fissure on the right is slightly larger than the left. Speech is normal, no aphasia or dysarthria is noted. Extraocular movements are full. Visual fields are full.  Motor: The patient has good strength in all 4 extremities.  Sensory examination: Soft touch sensation is symmetric on the face, arms, and legs.  Coordination: The patient has good finger-nose-finger and heel-to-shin bilaterally.  Gait and station: The patient has a limping type gait on the right leg, the gait is somewhat wide-based, unsteady. The patient uses a cane for ambulation. Romberg is negative. No drift is seen.  Reflexes: Deep tendon reflexes are symmetric.   Assessment/Plan:  1. History of cerebrovascular disease  2. Right Ramsay Hunt syndrome, facial weakness resolving  3. Gait disorder  The patient is overall doing fairly well, a prescription for Plavix was called in. The patient will follow-up in one year, sooner if needed.  Jill Alexanders MD 10/14/2016 3:15 PM  [facility] [address] [place], [place] [address]  Phone [phone] Fax [phone]

## 2016-10-20 ENCOUNTER — Other Ambulatory Visit: Payer: Self-pay | Admitting: *Deleted

## 2016-10-20 ENCOUNTER — Telehealth: Payer: Self-pay | Admitting: *Deleted

## 2016-10-20 DIAGNOSIS — L659 Nonscarring hair loss, unspecified: Secondary | ICD-10-CM

## 2016-10-20 NOTE — Telephone Encounter (Signed)
Patient called and she has been having a tremendous amount of hairloss, handfuls full are coming out. She has been under a lot of stress-her son is going through a divorce. And she knows it could be from this but she saw Dr.Willis and he suggested that she have a TSH level checked. She had one done here 02/2016-she is asking if you would do a nurse visit to check again?

## 2016-10-20 NOTE — Telephone Encounter (Signed)
[place] for lab visit for TSH, dx hair loss. Consider OV to discuss if normal. Will need OV if abnormal, to discuss treatment

## 2016-10-21 ENCOUNTER — Other Ambulatory Visit: Payer: Medicare Other

## 2016-10-21 DIAGNOSIS — L659 Nonscarring hair loss, unspecified: Secondary | ICD-10-CM

## 2016-10-21 LAB — TSH: TSH: 0.78 mIU/L

## 2016-11-03 ENCOUNTER — Ambulatory Visit: Payer: Self-pay | Admitting: Family Medicine

## 2016-11-12 ENCOUNTER — Ambulatory Visit: Payer: Self-pay | Admitting: Family Medicine

## 2016-11-17 ENCOUNTER — Ambulatory Visit (INDEPENDENT_AMBULATORY_CARE_PROVIDER_SITE_OTHER): Payer: Medicare Other | Admitting: Family Medicine

## 2016-11-17 ENCOUNTER — Encounter: Payer: Self-pay | Admitting: Family Medicine

## 2016-11-17 VITALS — BP 130/64 | HR 60 | Ht 67.0 in | Wt 207.6 lb

## 2016-11-17 DIAGNOSIS — D692 Other nonthrombocytopenic purpura: Secondary | ICD-10-CM

## 2016-11-17 DIAGNOSIS — L659 Nonscarring hair loss, unspecified: Secondary | ICD-10-CM

## 2016-11-17 NOTE — Progress Notes (Signed)
Chief Complaint  Patient presents with  . Alopecia    concerned about her amount of hair loss.(thinks it started after Bell's palsy incident) Also having itchy skin, thinks it's from red dye in fish oil-stopped fish oil fairly recently and it help a little but not completely. (stopped maybe a week ago).   She had called a month ago asking to have lab visit for thyroid test due to hair loss.  It was normal, and so she was asked to schedule visit to discuss her hair loss concerns.  She brings in a clump of hair, that she reports came from her brush for a week. She has had increased hair loss over the past week, but has been thinning since October.  She started noticing increase hair falling out when she was diagnosed with Bell's Palsy. She was put on Valtrex, and later developed nausea, weakness, vertigo which resolved after stopping the Valtrex (thinks she had an allergic reaction).  After this, she noticed increasing amounts of hair falling out with washing and brushing.  She denies any focal areas of hair loss, but does report overall thinning.  She hasn't had her hair colored or treated since that time. She takes Biotin, hair, skin and nails (and has been for quite some time--didn't just recently start this). She tried a special shampoo and conditioner (for hair loss) 3 weeks ago, hasn't helped much.  She still feels like it is falling out.    Her only difference has been her diet for weight loss, and significant stress.  Her diet really doesn't sound like the cause, didn't truly eliminite a lot--she doesn't eat at night, cut out carbs. Started the diet in [place] loss started around that time.  She is under stress because of her son.  He is an alcoholic, currently in rehab.  This has been a stressor for a year, went into rehab 15 days ago--she was part of the intervention.  She describes a lot of stressors--related to his girlfriend (who still isn't yet divorced from her husband, still  lives with him) is "stealing/spending" his money--he is paying their house payments, their son's college. She will have alcohol with him. She feels like she is an Firefighter.  His son is at [place], and doesn't want to have anything to do with him (is getting counseling there).  She hasn't been able to reach him at rehab--due to confidentiality reasons, her name isn't on a list to be able to call or visit.  But the girlfriend is, and this upset her.  She has former h/o being a Social worker.  She has support through her church and her friends.  She feels helpless with respect to her son.  Wants to bring him back home, away from his girlfriend and alcohol. States he said he can only sleep when he is at her house.   Reports witnessing some withdrawals when he was at her house for Christmas. Often times drunk by 10am.  Had been vomiting blood prior to the intervention. She worries about him a lot.  She finds that taking krill oil makes her skin itch. And then she scratches, and gets a lot of bruising to her skin. She thinks it might be from the red dye. She tried regular fish oil, but that "repeated" on her (fishy belch).  PMH, PSH, SH reviewed, updated  Outpatient Encounter Prescriptions as of 11/17/2016  Medication Sig Note  . Acetaminophen (TYLENOL ARTHRITIS PAIN PO) Take 2 tablets by mouth 2 (two) times  daily.    . amLODipine (NORVASC) 5 MG tablet Take 1 tablet (5 mg total) by mouth daily.   Marland Kitchen Apoaequorin (PREVAGEN PO) Take 1 tablet by mouth daily.   . Calcium Carbonate-Vitamin D (CALTRATE 600+D PO) Take 2 tablets by mouth daily.   . carvedilol (COREG) 25 MG tablet TAKE 1 & 1/2 TABLETS BY MOUTH TWICE A DAY   . clopidogrel (PLAVIX) 75 MG tablet Take 1 tablet (75 mg total) by mouth daily.   . Coenzyme Q10 (CO Q 10 PO) Take 1 tablet by mouth daily.    . Evening Primrose Oil CAPS Take 1 capsule by mouth daily.     Marland Kitchen ezetimibe (ZETIA) 10 MG tablet Take 1 tablet (10 mg total) by mouth daily.   Marland Kitchen  gabapentin (NEURONTIN) 300 MG capsule TAKE ONE CAPSULE AT BEDTIME   . glucose blood test strip Use as instructed   . hydrochlorothiazide (HYDRODIURIL) 12.5 MG tablet Take 1 tablet (12.5 mg total) by mouth daily.   Marland Kitchen KLOR-CON M10 10 MEQ tablet TAKE 1 TABLET (10 MEQ TOTAL) BY MOUTH DAILY.   Marland Kitchen losartan (COZAAR) 100 MG tablet TAKE 1 TABLET (100 MG TOTAL) BY MOUTH 2 (TWO) TIMES DAILY.   Marland Kitchen Misc Natural Products (OSTEO BI-FLEX ADV TRIPLE ST PO) Take 2 capsules by mouth daily.   . Multiple Vitamin (MULTIVITAMIN) tablet Take 1 tablet by mouth daily. 04/18/2013: Takes a vitamin pack for diabetics  . Needles & Syringes MISC 1 each by Does not apply route once. Pt uses freestyles lancets   . polyvinyl alcohol (LIQUIFILM TEARS) 1.4 % ophthalmic solution Place 1 drop into the right eye every 2 (two) hours while awake.   . solifenacin (VESICARE) 5 MG tablet Take 5 mg by mouth daily.    Marland Kitchen Specialty Vitamins Products ([employer]S HAIR/SKIN/NAILS PO) Take 2 each by mouth daily.   . cephALEXin (KEFLEX) 500 MG capsule Take 1 tablet prior to dental procedure, take second tablet evening after dental procedure (Patient not taking: Reported on 11/17/2016)   . Omega-3 Fatty Acids (FISH OIL) 1000 MG CAPS Take 1 capsule by mouth 2 (two) times daily. 11/17/2016: Stopped recently due to skin itching (she thinks may be from the red dye)    No facility-administered encounter medications on file as of 11/17/2016.    Allergies  Allergen Reactions  . Ciprofloxacin Nausea And Vomiting  . Codeine Nausea And Vomiting  . Contrast Media [Iodinated Diagnostic Agents] Nausea And Vomiting  . Morphine And Related Nausea And Vomiting  . Statins Other (See Comments)    lethargic  . Toprol Xl [Metoprolol Tartrate]     Symptoms of heart attack, per pt, told not to take it again (?chest pain)  . Clindamycin/Lincomycin Itching and Rash  . Penicillins Hives, Swelling and Rash  . Prednisone Palpitations    ROS: no fever, chills, URI symptoms.   Bruising on arms where she scratched when itchy.  +hair loss.  No changes in energy, bowels.  +intentional weight loss (actually gained 5 pounds since her last visit). No chest pain, palpitations, shortness of breath.   PHYSICAL EXAM:  BP 130/64 (BP Location: Left Arm, Patient Position: Sitting, Cuff Size: Normal)   Pulse 60   Ht 5\' 7"  (1.702 m)   Wt 207 lb 9.6 oz (94.2 kg)   BMI 32.51 kg/m   Wt Readings from Last 3 Encounters:  11/17/16 207 lb 9.6 oz (94.2 kg)  10/14/16 202 lb 8 oz (91.9 kg)  08/18/16 202 lb 12.8 oz (92  kg)   Well appearing, pleasant, talkative female in no distress HEENT: hair--has a lot of hair spray. White roots.  Skin appears normal--no flaking, erythema.  No broken hairs. Diffuse thinning, no focal loss.  Very difficult to assess whether there is new growth (hair is very fine, light in color, same as skin). Conjunctiva and sclera are clear Neck: no lymphadenopathy or thyromegaly Skin: Ecchymosis/purpura on upper arms.  Skin otherwise normal Psych: normal mood. Full range of affect--intermittently slightly tearful/worried, clearly also unhappy with his girlfriend.  She has normal speech, eye contact, hygiene and grooming  Lab Results  Component Value Date   TSH 0.78 10/21/2016   ASSESSMENT/PLAN:  Hair loss - suspect related to stress. Continue hair/skin/nail vitamin; trial Rogaine for Women  Senile purpura ([place])  Reassured--suspect hair loss is related to stress rather than other underlying cause.. Encouraged counseling, discussed Al-Anon (she reports she will never do that). Counseled extensively regarding alcoholism, why she may not be able to contact her son while in rehab, etc. Discussed support and helplessness she feels.  Okay to stop krill oil (she feels it makes her skin itch); can consider looking for one without red dye, as she feels that is the culprit.  25-30 min visit, more than 1/2 spent counseling.

## 2016-11-17 NOTE — Patient Instructions (Signed)
I suspect that your ongoing stress is contributing to your hair loss. Continue with the hair/skin/nails vitamins. Consider using Rogaine for Women. Your thyroid was recently normal, and your scalp skin is normal. Hopefully you should see the shedding and thinning decrease soon, and start to see some hair re-growth.  Consider counseling or Al-Anon.  Try and be patient and not feel helpless.  Your son is getting the help he needs now, and eventually will need mom again--but at this point, he needs the help from the folks at rehab.

## 2016-11-18 ENCOUNTER — Encounter: Payer: Self-pay | Admitting: Family Medicine

## 2016-12-17 ENCOUNTER — Other Ambulatory Visit: Payer: Self-pay | Admitting: Family Medicine

## 2016-12-17 DIAGNOSIS — I10 Essential (primary) hypertension: Secondary | ICD-10-CM

## 2016-12-19 ENCOUNTER — Other Ambulatory Visit: Payer: Self-pay | Admitting: Family Medicine

## 2016-12-19 DIAGNOSIS — I10 Essential (primary) hypertension: Secondary | ICD-10-CM

## 2017-01-01 ENCOUNTER — Telehealth: Payer: Self-pay

## 2017-01-01 NOTE — Telephone Encounter (Signed)
Pt called the office to let us know that she cut her leg and needed stitches. She states that she wanted to know if she is up to date on her tdap. Advised pt that she is due for tdap with injury. Last tdap 03/2007. Advised pt I could get approval from Dr. Tomi Bamberger for pt to come by office to get this, but she is going back to f/u at [facility] tomorrow and will tell them at the UC to give her the tdap. Advised pt to contact us tomorrow to let us know if she gets this. She is agreeable. They also gave her Keflex.  Victorino December

## 2017-01-01 NOTE — Telephone Encounter (Signed)
Patient advised.

## 2017-01-01 NOTE — Telephone Encounter (Signed)
She only needs Td, not Tdap.  Please advise her. Medicare will pay as part of an urgent care visit, so I recommend that she does get it at the urgent care for follow-up tomorrow.  But she only needs Td, as she already had a pertussis booster once.

## 2017-01-02 NOTE — Telephone Encounter (Signed)
[facility] called the office to let us know that they do not have the Td in the office, but has Tdap.  Per Dr. Tomi Bamberger this is ok for the UC to give. Tdap has been abstracted in pt's chart. Kristin Willis

## 2017-01-19 ENCOUNTER — Other Ambulatory Visit: Payer: Self-pay | Admitting: Family Medicine

## 2017-01-19 DIAGNOSIS — E7800 Pure hypercholesterolemia, unspecified: Secondary | ICD-10-CM

## 2017-01-28 ENCOUNTER — Encounter (HOSPITAL_COMMUNITY): Payer: Self-pay

## 2017-01-28 ENCOUNTER — Emergency Department (HOSPITAL_BASED_OUTPATIENT_CLINIC_OR_DEPARTMENT_OTHER)
Admit: 2017-01-28 | Discharge: 2017-01-28 | Disposition: A | Discharge status: Home / Self Care | Payer: Medicare Other | Attending: Emergency Medicine | Admitting: Emergency Medicine

## 2017-01-28 ENCOUNTER — Emergency Department (HOSPITAL_COMMUNITY)
Admission: EM | Admit: 2017-01-28 | Discharge: 2017-01-28 | Disposition: A | Discharge status: Home / Self Care | Payer: Medicare Other | Attending: Emergency Medicine | Admitting: Emergency Medicine

## 2017-01-28 DIAGNOSIS — M7989 Other specified soft tissue disorders: Secondary | ICD-10-CM

## 2017-01-28 DIAGNOSIS — Z85828 Personal history of other malignant neoplasm of skin: Secondary | ICD-10-CM | POA: Insufficient documentation

## 2017-01-28 DIAGNOSIS — J45909 Unspecified asthma, uncomplicated: Secondary | ICD-10-CM | POA: Diagnosis not present

## 2017-01-28 DIAGNOSIS — S8991XD Unspecified injury of right lower leg, subsequent encounter: Secondary | ICD-10-CM | POA: Diagnosis present

## 2017-01-28 DIAGNOSIS — E119 Type 2 diabetes mellitus without complications: Secondary | ICD-10-CM | POA: Insufficient documentation

## 2017-01-28 DIAGNOSIS — S81801D Unspecified open wound, right lower leg, subsequent encounter: Secondary | ICD-10-CM | POA: Diagnosis not present

## 2017-01-28 DIAGNOSIS — Z79899 Other long term (current) drug therapy: Secondary | ICD-10-CM | POA: Diagnosis not present

## 2017-01-28 DIAGNOSIS — W228XXD Striking against or struck by other objects, subsequent encounter: Secondary | ICD-10-CM | POA: Diagnosis not present

## 2017-01-28 DIAGNOSIS — I10 Essential (primary) hypertension: Secondary | ICD-10-CM | POA: Diagnosis not present

## 2017-01-28 DIAGNOSIS — Z853 Personal history of malignant neoplasm of breast: Secondary | ICD-10-CM | POA: Diagnosis not present

## 2017-01-28 DIAGNOSIS — Z8673 Personal history of transient ischemic attack (TIA), and cerebral infarction without residual deficits: Secondary | ICD-10-CM | POA: Diagnosis not present

## 2017-01-28 DIAGNOSIS — Z8542 Personal history of malignant neoplasm of other parts of uterus: Secondary | ICD-10-CM | POA: Insufficient documentation

## 2017-01-28 DIAGNOSIS — Z96652 Presence of left artificial knee joint: Secondary | ICD-10-CM | POA: Diagnosis not present

## 2017-01-28 LAB — COMPREHENSIVE METABOLIC PANEL
ALK PHOS: 43 U/L (ref 38–126)
ALT: 12 U/L — AB (ref 14–54)
ANION GAP: 7 (ref 5–15)
AST: 29 U/L (ref 15–41)
Albumin: 3.7 g/dL (ref 3.5–5.0)
BILIRUBIN TOTAL: 0.8 mg/dL (ref 0.3–1.2)
BUN: 23 mg/dL — ABNORMAL HIGH (ref 6–20)
CALCIUM: 9.5 mg/dL (ref 8.9–10.3)
CO2: 29 mmol/L (ref 22–32)
CREATININE: 0.83 mg/dL (ref 0.44–1.00)
Chloride: 105 mmol/L (ref 101–111)
GFR calc Af Amer: >60 mL/min (ref >60)
Glucose, Bld: 133 mg/dL — ABNORMAL HIGH (ref 65–99)
Potassium: 4.2 mmol/L (ref 3.5–5.1)
Sodium: 141 mmol/L (ref 135–145)
TOTAL PROTEIN: 7.2 g/dL (ref 6.5–8.1)

## 2017-01-28 LAB — CBC WITH DIFFERENTIAL/PLATELET
Basophils Absolute: 0.1 10*3/uL (ref 0.0–0.1)
Basophils Relative: 1 %
Eosinophils Absolute: 0.3 10*3/uL (ref 0.0–0.7)
Eosinophils Relative: 4 %
HCT: 42.3 % (ref 36.0–46.0)
HEMOGLOBIN: 13.9 g/dL (ref 12.0–15.0)
LYMPHS PCT: 38 %
Lymphs Abs: 2.5 10*3/uL (ref 0.7–4.0)
MCH: 32.7 pg (ref 26.0–34.0)
MCHC: 32.9 g/dL (ref 30.0–36.0)
MCV: 99.5 fL (ref 78.0–100.0)
MONOS PCT: 11 %
Monocytes Absolute: 0.8 10*3/uL (ref 0.1–1.0)
NEUTROS PCT: 46 %
Neutro Abs: 3 10*3/uL (ref 1.7–7.7)
Platelets: 210 10*3/uL (ref 150–400)
RBC: 4.25 MIL/uL (ref 3.87–5.11)
RDW: 12.9 % (ref 11.5–15.5)
WBC: 6.6 10*3/uL (ref 4.0–10.5)

## 2017-01-28 NOTE — ED Notes (Signed)
Patient reports that she has urinary incontinence and soiled her pad, underwear and pants.  Offered patient mesh panty with pad, adult brief and blue paper scrub pants for alternatives to wearing soiled pants patient reports that "I can't wear them (regarding paper pants), and I dont thing that will fit underneath her pants (regarding adult brief that was offered to patient)".

## 2017-01-28 NOTE — ED Provider Notes (Addendum)
[place] DEPT Provider Note   CSN: [id number] Arrival date & time: 01/28/17  1307     History   Chief Complaint Chief Complaint  Patient presents with  . Leg Injury    HPI Kristin Willis is a 83 y.o. female.  The history is provided by the patient.  Leg Pain   This is a new problem. Episode onset: 3 weeks. The problem occurs constantly. The problem has been gradually improving. The pain is present in the right lower leg. The quality of the pain is described as dull. Pertinent negatives include no numbness, full range of motion and no stiffness. Treatments tried: bactrim and doxycycline. There has been a history of trauma (slammed a car door and hit the leg 3 weeks ago).    Past Medical History:  Diagnosis Date  . Abnormality of gait 07/21/2016  . Arthritis    right knee, left wrist  . Asthma    related to allergies when working; resolved  . Breast cancer ([place]) 7/08 Inrasine ductal L breast  . Colon polyps    4/09--adenomatous. 04/2010--normal.  Repeat 2014  . DDD (degenerative disc disease), lumbar 4/06  . Diabetes mellitus    diet controlled  . Diabetic retinopathy   . DJD (degenerative joint disease)   . Dyslipidemia   . Endometrial cancer (HCC) hx  . Gout   . Hepatitis A history(in [place]-1950's)  . History of stroke 10/24/2013  . Hypertension   . Incontinence   . Membranous glomerulonephropathy    [name]  . Mini stroke [facility]) DrWillis  . Obesity, unspecified   . Osteopenia L hip, 4/06  . 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 Skin cancer    [name]  . Sliding hiatal hernia small sliding hiatal hernia  . Wears glasses     Patient Active Problem List   Diagnosis Date Noted  . 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 Abnormality of gait 07/21/2016  . Senile purpura ([place]) 08/07/2015  . Mixed hyperlipidemia 01/29/2015  . Microalbuminuria 01/10/2014  . Urge incontinence 01/10/2014  . History of stroke 10/24/2013  . PAD  (peripheral artery disease) ([place]) 07/11/2013  . Peripheral neuropathy 01/10/2013  . Osteopenia 06/21/2012  . Breast cancer ([place]) 09/01/2011  . Gout 08/06/2011  . Pure hypercholesterolemia 03/05/2011  . DM (diabetes mellitus), type 2 with peripheral vascular complications ([place]) 52/84/1324  . Essential hypertension, benign 03/05/2011    Past Surgical History:  Procedure Laterality Date  . ABDOMINAL HYSTERECTOMY     endometrial cancer  . APPENDECTOMY    . BREAST LUMPECTOMY     left  . BREAST LUMPECTOMY  8/08 L Breast ([name])  . CATARACT EXTRACTION    . COLONOSCOPY  8/08 [name] (normal)  . kidney stones    . left middle finger    . REPLACEMENT TOTAL KNEE  Left  . SKIN CANCER EXCISION    . TOTAL KNEE ARTHROPLASTY     left    OB History    No data available       Home Medications    Prior to Admission medications   Medication Sig Start Date End Date Taking? Authorizing Provider  Acetaminophen (TYLENOL ARTHRITIS PAIN PO) Take 2 tablets by mouth 2 (two) times daily.    Yes [provider]  amLODipine (NORVASC) 5 MG tablet Take 1 tablet (5 mg total) by mouth daily. 08/18/16  Yes Rita Ohara, MD  Apoaequorin (PREVAGEN PO) Take 1 tablet by mouth daily.  Yes [provider]  Calcium Carbonate-Vitamin D (CALTRATE 600+D PO) Take 2 tablets by mouth daily.   Yes [provider]  carvedilol (COREG) 25 MG tablet TAKE 1 & 1/2 TABLETS BY MOUTH TWICE A DAY 12/17/16  Yes Rita Ohara, MD  clopidogrel (PLAVIX) 75 MG tablet Take 1 tablet (75 mg total) by mouth daily. 10/14/16  Yes Kathrynn Ducking, MD  Coenzyme Q10 (CO Q 10 PO) Take 1 tablet by mouth daily.    Yes [provider]  Evening Primrose Oil CAPS Take 1 capsule by mouth daily.     Yes [provider]  ezetimibe (ZETIA) 10 MG tablet TAKE 1 TABLET DAILY 01/19/17  Yes Rita Ohara, MD  gabapentin (NEURONTIN) 300 MG capsule TAKE ONE CAPSULE AT BEDTIME 10/01/16  Yes Rita Ohara, MD    hydrochlorothiazide (HYDRODIURIL) 12.5 MG tablet Take 1 tablet (12.5 mg total) by mouth daily. 08/18/16  Yes Rita Ohara, MD  KLOR-CON M10 10 MEQ tablet TAKE 1 TABLET (10 MEQ TOTAL) BY MOUTH DAILY. 09/22/16  Yes Rita Ohara, MD  losartan (COZAAR) 100 MG tablet TAKE 1 TABLET (100 MG TOTAL) BY MOUTH 2 (TWO) TIMES DAILY. 08/18/16  Yes Rita Ohara, MD  Misc Natural Products (OSTEO BI-FLEX ADV TRIPLE ST PO) Take 2 capsules by mouth daily.   Yes [provider]  Multiple Vitamin (MULTIVITAMIN) tablet Take 1 tablet by mouth daily.   Yes [provider]  Omega-3 Fatty Acids (FISH OIL) 1000 MG CAPS Take 1 capsule by mouth 2 (two) times daily.   Yes [provider]  solifenacin (VESICARE) 5 MG tablet Take 5 mg by mouth daily.    Yes [provider]  Specialty Vitamins Products (CVS HAIR/SKIN/NAILS PO) Take 2 each by mouth daily.   Yes [provider]  cephALEXin (KEFLEX) 500 MG capsule Take 1 tablet prior to dental procedure, take second tablet evening after dental procedure Patient not taking: Reported on 11/17/2016 08/06/15   Rita Ohara, MD  glucose blood test strip Use as instructed 01/29/15   Rita Ohara, MD  Needles & Syringes MISC 1 each by Does not apply route once. Pt uses freestyles lancets 01/11/13   Rita Ohara, MD  polyvinyl alcohol (LIQUIFILM TEARS) 1.4 % ophthalmic solution Place 1 drop into the right eye every 2 (two) hours while awake. 07/09/16   Fredia Sorrow, MD    Family History Family History  Problem Relation Age of Onset  . Heart attack Father   . Diabetes Father   . Heart attack Mother   . Hypertension Mother   . Diabetes Mother   . Hypertension Brother   . Stroke Brother   . Melanoma Brother   . Diabetes Brother   . Hypertension Son   . Alcohol abuse Son   . Cancer Neg Hx     Social History Social History  Substance Use Topics  . Smoking status: Never Smoker  . Smokeless tobacco: Never Used  . Alcohol use No     Allergies    Ciprofloxacin; Codeine; Contrast media [iodinated diagnostic agents]; Morphine and related; Statins; Toprol xl [metoprolol tartrate]; Clindamycin/lincomycin; Penicillins; and Prednisone   Review of Systems Review of Systems  Musculoskeletal: Negative for stiffness.  Neurological: Negative for numbness.  All other systems are reviewed and are negative for acute change except as noted in the HPI    Physical Exam Updated Vital Signs BP (!) 142/65 (BP Location: Left Arm)   Pulse 84   Temp 98.2 F (36.8 C) (Oral)  Resp 16   SpO2 96%   Physical Exam  Constitutional: She is oriented to person, place, and time. She appears well-developed and well-nourished. No distress.  HENT:  Head: Normocephalic and atraumatic.  Nose: Nose normal.  Eyes: Conjunctivae and EOM are normal. Pupils are equal, round, and reactive to light. Right eye exhibits no discharge. Left eye exhibits no discharge. No scleral icterus.  Neck: Normal range of motion. Neck supple.  Cardiovascular: Normal rate and regular rhythm.  Exam reveals no gallop and no friction rub.   No murmur heard. Pulmonary/Chest: Effort normal and breath sounds normal. No stridor. No respiratory distress. She has no rales.  Abdominal: Soft. She exhibits no distension. There is no tenderness.  Musculoskeletal: She exhibits no tenderness.       Right lower leg: She exhibits swelling and edema. She exhibits no tenderness.       Legs: Neurological: She is alert and oriented to person, place, and time.  Skin: Skin is warm and dry. No rash noted. She is not diaphoretic. No erythema.  Psychiatric: She has a normal mood and affect.  Vitals reviewed.      ED Treatments / Results  Labs (all labs ordered are listed, but only abnormal results are displayed) Labs Reviewed  COMPREHENSIVE METABOLIC PANEL - Abnormal; Notable for the following:       Result Value   Glucose, Bld 133 (*)    BUN 23 (*)    ALT 12 (*)    All other components within  normal limits  CBC WITH DIFFERENTIAL/PLATELET    EKG  EKG Interpretation None       Radiology No results found.  Procedures Procedures (including critical care time)  Medications Ordered in ED Medications - No data to display   Initial Impression / Assessment and Plan / ED Course  I have reviewed the triage vital signs and the nursing notes.  Pertinent labs & imaging results that were available during my care of the patient were reviewed by me and considered in my medical decision making (see chart for details).     Right leg wound with mild hyperemia which I believe is secondary to the patient's fluid retention. There is no discharge or tenderness to palpation that would be concerning for infectious process at this time. Screening labs were obtained CBC was negative for leukocytosis. Other labs grossly reassuring.    Given her reported recent trip with unilateral leg swelling history of cancer ultrasound was obtained which ruled out DVT.  Given the edema and the patient's history of diabetes she is at risk for delayed wound healing. I had a lengthy discussion with patient regarding close follow-up with her primary care provider and referral to wound clinic for further management of her wound.  The patient is safe for discharge with strict return precautions.   Final Clinical Impressions(s) / ED Diagnoses   Final diagnoses:  Wound of right lower extremity, subsequent encounter  Leg swelling   Disposition: Discharge  Condition: Good  I have discussed the results, Dx and Tx plan with the patient who expressed understanding and agree(s) with the plan. Discharge instructions discussed at great length. The patient was given strict return precautions who verbalized understanding of the instructions. No further questions at time of discharge.    New Prescriptions   No medications on file    Follow Up: Rita Ohara, [address] [place]  [id number] [phone]   in 5-7 days For close follow up to assess for leg  wound        Fatima Blank, MD 01/28/17 [phone]

## 2017-01-28 NOTE — Progress Notes (Signed)
**  Preliminary report by tech**  Right lower extremity venous duplex complete. There is no evidence of deep or superficial vein thrombosis involving the right lower extremity. All visualized vessels appear patent and compressible. There is no evidence of a Baker's cyst on the right. Results were given to Dr. Leonette Monarch.   01/28/17 4:54 PM Carlos Levering RVT

## 2017-01-28 NOTE — ED Notes (Signed)
Vascular tech at bedside. °

## 2017-01-28 NOTE — ED Triage Notes (Signed)
Pt reports hitting her R lower leg with the car door 3 weeks ago. She has a wound there that has not healed. There is black around the wound edges, but she denies purulent discharge. She has been having it checked at the Urgent Care at [facility] and they recommended that she come here. She has taken both sulfa and doxycycline for it. Reddening noted under wound and down to foot. Pt takes Plavix.

## 2017-02-01 NOTE — Progress Notes (Signed)
Chief Complaint  Patient presents with  . Follow-up    ER follow up, wound on right leg-needs referral to wound clinic.   Marland Kitchen Urinary Frequency    x 3 days.     She cut her leg 4/18 (slammed car door into leg).  She was treated in urgent care with stitches, TdaP and put on antibiotics.  She was treated with a few different antibiotics for suspected cellulitis, including sulfa and doxy.  I think she had been following up at the urgent care near her, where she originally was treated. She reports they sent her to ER due to concern for a blood clot (swelling, redness in right leg). ER notes were reviewed.  They felt that the hyperemia was secondary to fluid retention--she was nontender, normal WBC.  They did [name] to r/o DVT given unilateral leg swelling and recent trip--it was negative. She states redness and swelling had been worse, improving since keeping the leg elevated over heart level.   She is complaining of urinary frequency x 3 days.  Denies dysuria. She is taking Vesicare and getting monthly treatments. Her leakage has remained about the same.  Notes increased frequency but no odor, dysuria, fever, abdominal or flank pain  PMH, PSH, SH reviewed  Outpatient Encounter Prescriptions as of 02/02/2017  Medication Sig Note  . Acetaminophen (TYLENOL ARTHRITIS PAIN PO) Take 2 tablets by mouth 2 (two) times daily.    Marland Kitchen amLODipine (NORVASC) 5 MG tablet Take 1 tablet (5 mg total) by mouth daily.   Marland Kitchen Apoaequorin (PREVAGEN PO) Take 1 tablet by mouth daily.   . Biotin 1000 MCG tablet Take 1,000 mcg by mouth 3 (three) times daily.   . Calcium Carbonate-Vitamin D (CALTRATE 600+D PO) Take 2 tablets by mouth daily.   . carvedilol (COREG) 25 MG tablet TAKE 1 & 1/2 TABLETS BY MOUTH TWICE A DAY   . clopidogrel (PLAVIX) 75 MG tablet Take 1 tablet (75 mg total) by mouth daily.   . Coenzyme Q10 (CO Q 10 PO) Take 1 tablet by mouth daily.    . Evening Primrose Oil CAPS Take 1 capsule by mouth daily.     Marland Kitchen  ezetimibe (ZETIA) 10 MG tablet TAKE 1 TABLET DAILY   . gabapentin (NEURONTIN) 300 MG capsule TAKE ONE CAPSULE AT BEDTIME   . glucose blood test strip Use as instructed   . hydrochlorothiazide (HYDRODIURIL) 12.5 MG tablet Take 1 tablet (12.5 mg total) by mouth daily.   Marland Kitchen KLOR-CON M10 10 MEQ tablet TAKE 1 TABLET (10 MEQ TOTAL) BY MOUTH DAILY.   Marland Kitchen losartan (COZAAR) 100 MG tablet TAKE 1 TABLET (100 MG TOTAL) BY MOUTH 2 (TWO) TIMES DAILY.   Marland Kitchen Misc Natural Products (OSTEO BI-FLEX ADV TRIPLE ST PO) Take 2 capsules by mouth daily.   . Multiple Vitamin (MULTIVITAMIN) tablet Take 1 tablet by mouth daily. 04/18/2013: Takes a vitamin pack for diabetics  . Needles & Syringes MISC 1 each by Does not apply route once. Pt uses freestyles lancets   . Omega-3 Fatty Acids (FISH OIL) 1000 MG CAPS Take 1 capsule by mouth 2 (two) times daily. 11/17/2016: Stopped recently due to skin itching (she thinks may be from the red dye)   . polyvinyl alcohol (LIQUIFILM TEARS) 1.4 % ophthalmic solution Place 1 drop into the right eye every 2 (two) hours while awake.   . solifenacin (VESICARE) 5 MG tablet Take 5 mg by mouth daily.    Marland Kitchen Specialty Vitamins Products ([employer] HAIR/SKIN/NAILS PO) Take 2  each by mouth daily.   . cephALEXin (KEFLEX) 500 MG capsule Take 1 tablet prior to dental procedure, take second tablet evening after dental procedure (Patient not taking: Reported on 02/02/2017)   . [DISCONTINUED] carvedilol (COREG) 25 MG tablet TAKE 1 & 1/2 TABLETS BY MOUTH TWICE A DAY    No facility-administered encounter medications on file as of 02/02/2017.    Allergies  Allergen Reactions  . Ciprofloxacin Nausea And Vomiting  . Codeine Nausea And Vomiting  . Contrast Media [Iodinated Diagnostic Agents] Nausea And Vomiting  . Morphine And Related Nausea And Vomiting  . Statins Other (See Comments)    lethargic  . Toprol Xl [Metoprolol Tartrate]     Symptoms of heart attack, per pt, told not to take it again (?chest pain)  .  Clindamycin/Lincomycin Itching and Rash  . Penicillins Hives, Swelling and Rash    Has patient had a PCN reaction causing immediate rash, facial/tongue/throat swelling, SOB or lightheadedness with hypotension: no Has patient had a PCN reaction causing severe rash ivolving mucus membranes or skin necrosis: no Has patient had a PCN reaction that required hospitalization no Has patient had a PCN reaction occurring within the last 10 years: no If all of the above answers are "NO", then may proceed with Cephalosporin use.  . Prednisone Palpitations    ROS:  No fever, chills, URI symptoms, chest pain, shortness of breath, dizziness, bowel changes.  Urinary frequency as per HPI.  No other skin concerns, bleeding, bruising.  Denies any pain.  Swelling has improved some.  PHYSICAL EXAM:  BP 130/70 (BP Location: Right Arm, Patient Position: Sitting, Cuff Size: Normal)   Pulse 72   Temp 98.7 F (37.1 C) (Tympanic)   Ht 5\' 7"  (1.702 m)   Wt 206 lb (93.4 kg)   BMI 32.26 kg/m   Pleasant, elderly female in good spirits RLE:  mid portion of lateral leg there is a 4 x 1.5cm area of scab Surrounded by large area flaking/peeling skin.  Mild erythema (faintly pink), minimal warmth.  Nontender.  No crusting 1+ edema RLE, and tender when pressing in on the swelling.  Trace edema on the left.  Normal pulses  Heart: regular rate and rhythm Lungs: clear bilaterally Back: no CVA tenderness Abdomen: soft, nontender  Urine dip: normal.  SG >=1.030  ASSESSMENT/PLAN:  Nonhealing nonsurgical wound - to RLE; given h/o PAD, DM (diet-controlled), will refer to wound clinic to help with healing  Urinary frequency - no e/o UTI; suspect related to mobilization of fluid/edema in legs - Plan: POCT Urinalysis Dipstick  DM (diabetes mellitus), type 2 with peripheral vascular complications ([place]) - diet controlled    Nonhealing wound RLE Refer to wound care [facility] or [facility], not [facility]   F/u as  scheduled in near future for med check

## 2017-02-02 ENCOUNTER — Other Ambulatory Visit: Payer: Self-pay | Admitting: Family Medicine

## 2017-02-02 ENCOUNTER — Encounter: Payer: Self-pay | Admitting: Family Medicine

## 2017-02-02 ENCOUNTER — Ambulatory Visit (INDEPENDENT_AMBULATORY_CARE_PROVIDER_SITE_OTHER): Payer: Medicare Other | Admitting: Family Medicine

## 2017-02-02 VITALS — BP 130/70 | HR 72 | Temp 98.7°F | Ht 67.0 in | Wt 206.0 lb

## 2017-02-02 DIAGNOSIS — R350 Frequency of micturition: Secondary | ICD-10-CM

## 2017-02-02 DIAGNOSIS — I10 Essential (primary) hypertension: Secondary | ICD-10-CM

## 2017-02-02 DIAGNOSIS — T148XXA Other injury of unspecified body region, initial encounter: Secondary | ICD-10-CM

## 2017-02-02 DIAGNOSIS — E1151 Type 2 diabetes mellitus with diabetic peripheral angiopathy without gangrene: Secondary | ICD-10-CM | POA: Diagnosis not present

## 2017-02-02 LAB — POCT URINALYSIS DIPSTICK
Bilirubin, UA: NEGATIVE
GLUCOSE UA: NEGATIVE
Ketones, UA: NEGATIVE
Leukocytes, UA: NEGATIVE
NITRITE UA: NEGATIVE
PH UA: 6 (ref 5.0–8.0)
Protein, UA: NEGATIVE
RBC UA: NEGATIVE
Spec Grav, UA: >=1.03 — AB (ref 1.010–1.025)
UROBILINOGEN UA: NEGATIVE U/dL — AB

## 2017-02-02 NOTE — Patient Instructions (Signed)
Continue to keep the leg elevated to help keep the swelling down. Use antibacterial ointment (bacitracin or neosporin) to the open/scabbed area of the leg. We are referring you to a wound care clinic.  I see no evidence of bacterial infection. I think your increased urinary frequency is related to mobilizing the fluid in the legs (your swelling improved). There is no evidence of urinary tract infection.

## 2017-02-03 ENCOUNTER — Encounter: Payer: Self-pay | Admitting: Family Medicine

## 2017-02-06 ENCOUNTER — Encounter (HOSPITAL_BASED_OUTPATIENT_CLINIC_OR_DEPARTMENT_OTHER): Payer: Medicare Other | Attending: Internal Medicine

## 2017-02-06 DIAGNOSIS — I10 Essential (primary) hypertension: Secondary | ICD-10-CM | POA: Insufficient documentation

## 2017-02-06 DIAGNOSIS — I87321 Chronic venous hypertension (idiopathic) with inflammation of right lower extremity: Secondary | ICD-10-CM | POA: Diagnosis not present

## 2017-02-06 DIAGNOSIS — E11622 Type 2 diabetes mellitus with other skin ulcer: Secondary | ICD-10-CM | POA: Insufficient documentation

## 2017-02-06 DIAGNOSIS — E1140 Type 2 diabetes mellitus with diabetic neuropathy, unspecified: Secondary | ICD-10-CM | POA: Diagnosis not present

## 2017-02-06 DIAGNOSIS — L97819 Non-pressure chronic ulcer of other part of right lower leg with unspecified severity: Secondary | ICD-10-CM | POA: Insufficient documentation

## 2017-02-06 DIAGNOSIS — Z96652 Presence of left artificial knee joint: Secondary | ICD-10-CM | POA: Insufficient documentation

## 2017-02-09 ENCOUNTER — Other Ambulatory Visit: Payer: Self-pay | Admitting: Family Medicine

## 2017-02-09 DIAGNOSIS — I10 Essential (primary) hypertension: Secondary | ICD-10-CM

## 2017-02-13 ENCOUNTER — Encounter (HOSPITAL_BASED_OUTPATIENT_CLINIC_OR_DEPARTMENT_OTHER): Payer: Medicare Other | Attending: Internal Medicine

## 2017-02-13 DIAGNOSIS — I87331 Chronic venous hypertension (idiopathic) with ulcer and inflammation of right lower extremity: Secondary | ICD-10-CM | POA: Insufficient documentation

## 2017-02-13 DIAGNOSIS — E11622 Type 2 diabetes mellitus with other skin ulcer: Secondary | ICD-10-CM | POA: Insufficient documentation

## 2017-02-13 DIAGNOSIS — E1151 Type 2 diabetes mellitus with diabetic peripheral angiopathy without gangrene: Secondary | ICD-10-CM | POA: Diagnosis not present

## 2017-02-13 DIAGNOSIS — E1142 Type 2 diabetes mellitus with diabetic polyneuropathy: Secondary | ICD-10-CM | POA: Insufficient documentation

## 2017-02-13 DIAGNOSIS — L97812 Non-pressure chronic ulcer of other part of right lower leg with fat layer exposed: Secondary | ICD-10-CM | POA: Insufficient documentation

## 2017-02-13 DIAGNOSIS — I10 Essential (primary) hypertension: Secondary | ICD-10-CM | POA: Diagnosis not present

## 2017-02-15 DIAGNOSIS — E113291 Type 2 diabetes mellitus with mild nonproliferative diabetic retinopathy without macular edema, right eye: Secondary | ICD-10-CM | POA: Insufficient documentation

## 2017-02-15 DIAGNOSIS — E113299 Type 2 diabetes mellitus with mild nonproliferative diabetic retinopathy without macular edema, unspecified eye: Secondary | ICD-10-CM | POA: Insufficient documentation

## 2017-02-15 NOTE — Progress Notes (Signed)
Chief Complaint  Patient presents with  . Hypertension    fasting med check. BS's 92,136 and 116 from hone meter.     Kristin Willis is a 83 y.o. female who presents for annual wellness visit and follow-up on chronic medical conditions.  She has the following concerns:  She was seen in March with complaint of hair loss.  Thyroid was normal. She is taking Biotin, along with hair/skin/nails vitamin, and feels like it is regrowing. We discussed possibility of stress regarding her alcoholic son (and Valtrex which she took for treating her Bell's Palsy) as situational occurrences that could have contributed.  She reports her son is out of treatment center, but is still drinking beer (no longer hard alcohol) and is calling her daily.  She was recently seen with lower extremity wound, for which she was referred to the wound clinic. She has been seen twice by the wound clinic. Going weekly on Fridays. She reports it isn't as red or as swollen.  Diabetes: Controlled by diet, she has never taken medications.Checks sugars about once a week, last being 92,136 and 116. Denies polydipsia (some dry mouth from meds, no changes), polyuria (drinks a lot, no changes). She has tingling in her left foot since her minor stroke, unchanged. She had gotten a cortisone shot once from podiatrist, which temporarily helped. No paresthesias elsewhere. Sees eye doctor once yearly, in January.    She no longer goes to water aerobics (stopped due to the injury/wound, plans to return once healed). She tries to remain active, walking more in the supermarkets (not riding cart).   She has h/o frozen shoulder, s/p cortisone injection in 04/2016, had 2 shots total, helped some.  She still has limited range of motion in her right arm, has pain and requires the assistance of her left arm sometimes.  HTN: BP's at home are "normal"--she didn't bring in the list today. Denies side effects of her medications. Denies chest pain,  headaches, dizziness, shortness of breath, muscle cramps. Some intermittent swelling of her feet, and keeping the legs elevated on a pillow at night helped decrease the swelling (and caused diuresis with increased urinary frequency).  Denies claudication.   She gets some occasional pain in the right groin, relieved by "blue emu" cream.  Denies consistent pain with walking/weight-bearing.  Left breast cancer: She "graduated" from going to the oncologist, after going for 7 years. Last visit with Dr. Jana Hakim was in September 2015. She continues to get yearly [facility]ograms [facility] (05[facility]), and see Dr. Nori Riis yearly for her breast (and pelvic) exams. She has prolapse of her bladder, and she tried pessaries, but they fall out, and Dr. Nori Riis didn't want to operate. She gets occasional UTI's which she relates to the prolapse. Currently denies any urinary symptoms.   OAB--seen at Dr. McDiarmid's office monthly for transcutaneous nerve stimulation treatments (sees him just 1-2x/year). She wears pads all the time, and special underwear at night, getting up 2x/night.  Overall doing much better on the treatments and the Vesicare. Admits to taking it at night rather than in the morning, only recently noted these directions.  Hyperlipidemia-- She is taking low dose krill oil (all fish oils have red dye, which makes her itch). She can tolerate taking 563m daily of Krill oil, but could not tolerate higher dose.Trying to follow a low fat diet. She limits her carbs, sweets, fried foods. Snacks on cheese and crackers. Compliant with Zetia. She is fasting for labs today. Lab Results  Component Value Date   CHOL 145 08/18/2016   HDL 32 (L) 08/18/2016   LDLCALC 63 08/18/2016   TRIG 250 (H) 08/18/2016   CHOLHDL 4.5 08/18/2016    She has been taking Prevagen for about 2-3 years to help with memory, and she finds it effective. She notices improvement--easier to recall names. She is very busy, but keeps up  with everything by keeping an organized, detailed calendar. Denies any memory concerns--doesn't lose keys, car, get lost driving.  H/o CVA, on Plavix.  She has residual numbness of L foot. Also felt to have a mild peripheral neuropathy, possibly related to her diabetes (diet controlled). She remains on gabapentin (started by Dr. Jana Hakim in the past). She denies any pain in her foot, just a mild tingling.  She has concerns about the potential suicide risk mentioned on the packaging, and is asking about whether she should stay on this.  She remains on Plavix. Denies any bleeding or side effects (some bruising on hands, forearms)  Gout: She hasn't had a flare in many years. She previously took allopurinol, but it was stopped over a year ago. No flares since stopping it.  Lab Results  Component Value Date   LABURIC 4.0 02/14/2016    Osteopenia:  Vitamin D level was normal at 38 in 07/2015.  Last DEXA was 04/2014, mild osteopenia noted, but worsening compared to baseline and prior.  She has an order in the computer--needs to schedule (for 07/2017). DEXA 04/2014: Left FOREARM T Score: -1.1; Left FEMUR T-Score: -1.4 ASSESSMENT: Patient's diagnostic category is low bone mass by WHO Criteria. COMPARISON: Statistically significant worsening bone mineral density when compared with previous and baseline.  Membranous nephropathy--sees [name] once yearly, last in 07/2016. Doing well.   Immunization History  Administered Date(s) Administered  . Influenza Split 06/14/2012, 06/09/2013, 05/30/2014  . Influenza Whole 07/06/2002, 06/28/2008, 06/25/2011  . Influenza-Unspecified 05/31/2015, 05/27/2016  . Pneumococcal Conjugate-13 01/09/2014  . Pneumococcal Polysaccharide-23 04/05/2007  . Tdap 04/05/2007, 01/02/2017  . Zoster 09/15/2004   Last Pap smear:    (h/o endometrial CA)--sees Dr. Nori Riis yearly in July Last mammogram: 05/2016 Last colonoscopy: 01/2014--tubular adenoma.  F/u not recommended  due to age, per Dr. Cristina Gong Last DEXA: 04/2014 Dentist: rarely sees Dr. Rolland Porter, but sees Dr. Essie Hart and reports they do all of her cleanings, x-rays, etc. Ophtho: Dr. Zadie Rhine yearly Exercise: limited recently.  Some walking Vit D-OH 38 in 07/2015  Other doctors caring for patient include: GYN:  Dr. Nori Riis Nephro: [name] Neuro: Dr. Jannifer Franklin Urology: Dr. McDiarmid Ortho: Dr. Tamera Punt ([facility]) Ophtho: Dr. Zadie Rhine Dentist: Dr. Rolland Porter, and sees Dr. Essie Hart (periodontist) GI: Dr. Cristina Gong Derm: [name] Wound doctor:  Dr. Dellia Nims  Depression screen:  Negative Falls screen: Negative Functional status survey: notable for leakage of urine.  Starting to notice some difficulty hearing in church.  See Epic for full questionnaires  End of Life Discussion:  Patient has a living will and medical power of attorney.  Past Medical History:  Diagnosis Date  . Abnormality of gait 07/21/2016  . Arthritis    right knee, left wrist  . Asthma    related to allergies when working; resolved  . Breast cancer ([place]) 7/08 Inrasine ductal L breast  . Colon polyps    4/09--adenomatous. 04/2010--normal.  Repeat 2014  . DDD (degenerative disc disease), lumbar 4/06  . Diabetes mellitus    diet controlled  . Diabetic retinopathy   . DJD (degenerative joint disease)   . Dyslipidemia   .  Endometrial cancer (HCC) hx  . Gout   . Hepatitis A history(in [place]-1950's)  . History of stroke 10/24/2013  . Hypertension   . Incontinence   . Membranous glomerulonephropathy    Dr. Dunham  . Mini stroke (HCC) DrWillis  . Obesity, unspecified   . Osteopenia L hip, 4/06  . 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 Skin cancer    Dr. Lupton  . Sliding hiatal hernia small sliding hiatal hernia  . Wears glasses     Past Surgical History:  Procedure Laterality Date  . ABDOMINAL HYSTERECTOMY     endometrial cancer  . APPENDECTOMY    . BREAST LUMPECTOMY     left  . BREAST LUMPECTOMY  8/08 L  Breast ([name])  . CATARACT EXTRACTION    . COLONOSCOPY  8/08 [name] (normal)  . kidney stones    . left middle finger    . REPLACEMENT TOTAL KNEE  Left  . SKIN CANCER EXCISION    . TOTAL KNEE ARTHROPLASTY     left    Social History   Social History  . Marital status: Married    Spouse name: N/A  . Number of children: 3  . Years of education: 18   Occupational History  . retired    Social History Main Topics  . Smoking status: Never Smoker  . Smokeless tobacco: Never Used  . Alcohol use No  . Drug use: No  . Sexual activity: No   Other Topics Concern  . Not on file   Social History Narrative   Lives alone.  Daughter lives in [place], daughter lives in [place], son in [place], [place].  Widowed (husband was in [place] [employer])   Son is an alcoholic (in rehab 11/2016)      Patient drinks 2 cups of tea daily, not daily   Patient is right handed.     Family History  Problem Relation Age of Onset  . Heart attack Father   . Diabetes Father   . Heart attack Mother   . Hypertension Mother   . Diabetes Mother   . Hypertension Brother   . Stroke Brother   . Melanoma Brother   . Diabetes Brother   . Hypertension Son   . Alcohol abuse Son   . Stroke Paternal Grandmother   . Diabetes Paternal Grandfather   . Vision loss Paternal Grandfather        related to diabetes  . Cancer Neg Hx     Outpatient Encounter Prescriptions as of 02/16/2017  Medication Sig Note  . Acetaminophen (TYLENOL ARTHRITIS PAIN PO) Take 2 tablets by mouth 2 (two) times daily.    . amLODipine (NORVASC) 5 MG tablet Take 1 tablet (5 mg total) by mouth daily.   . Apoaequorin (PREVAGEN PO) Take 1 tablet by mouth daily.   . Biotin 1000 MCG tablet Take 1,000 mcg by mouth 3 (three) times daily.   . Calcium Carbonate-Vitamin D (CALTRATE 600+D PO) Take 2 tablets by mouth 2 (two) times daily.    . carvedilol (COREG) 25 MG tablet TAKE 1 & 1/2 TABLETS BY MOUTH TWICE A DAY   . clopidogrel (PLAVIX)  75 MG tablet Take 1 tablet (75 mg total) by mouth daily.   . Coenzyme Q10 (CO Q 10 PO) Take 1 tablet by mouth daily.    . Evening Primrose Oil CAPS Take 1 capsule by mouth daily.     . ezetimibe (ZETIA) 10 MG tablet TAKE 1 TABLET DAILY   .   gabapentin (NEURONTIN) 300 MG capsule TAKE ONE CAPSULE AT BEDTIME   . Garlic (GARLIQUE PO) Take 1 capsule by mouth daily.   . glucose blood test strip Use as instructed   . hydrochlorothiazide (HYDRODIURIL) 12.5 MG tablet TAKE 1 TABLET DAILY   . KLOR-CON M10 10 MEQ tablet TAKE 1 TABLET (10 MEQ TOTAL) BY MOUTH DAILY.   . Krill Oil 500 MG CAPS Take 1 capsule by mouth daily.   . losartan (COZAAR) 100 MG tablet TAKE 1 TABLET TWICE A DAY   . Misc Natural Products (OSTEO BI-FLEX ADV TRIPLE ST PO) Take 2 capsules by mouth daily.   . Multiple Vitamin (MULTIVITAMIN) tablet Take 1 tablet by mouth daily. 04/18/2013: Takes a vitamin pack for diabetics  . Needles & Syringes MISC 1 each by Does not apply route once. Pt uses freestyles lancets   . solifenacin (VESICARE) 5 MG tablet Take 5 mg by mouth daily.    . Specialty Vitamins Products ([employer] HAIR/SKIN/NAILS PO) Take 2 each by mouth daily.   . cephALEXin (KEFLEX) 500 MG capsule Take 1 tablet prior to dental procedure, take second tablet evening after dental procedure (Patient not taking: Reported on 02/02/2017)   . Omega-3 Fatty Acids (FISH OIL) 1000 MG CAPS Take 1 capsule by mouth 2 (two) times daily. 11/17/2016: Stopped recently due to skin itching (she thinks may be from the red dye)   . [DISCONTINUED] polyvinyl alcohol (LIQUIFILM TEARS) 1.4 % ophthalmic solution Place 1 drop into the right eye every 2 (two) hours while awake.    No facility-administered encounter medications on file as of 02/16/2017.     Allergies  Allergen Reactions  . Ciprofloxacin Nausea And Vomiting  . Codeine Nausea And Vomiting  . Contrast Media [Iodinated Diagnostic Agents] Nausea And Vomiting  . Morphine And Related Nausea And Vomiting  .  Statins Other (See Comments)    lethargic  . Toprol Xl [Metoprolol Tartrate]     Symptoms of heart attack, per pt, told not to take it again (?chest pain)  . Clindamycin/Lincomycin Itching and Rash  . Penicillins Hives, Swelling and Rash    Has patient had a PCN reaction causing immediate rash, facial/tongue/throat swelling, SOB or lightheadedness with hypotension: no Has patient had a PCN reaction causing severe rash ivolving mucus membranes or skin necrosis: no Has patient had a PCN reaction that required hospitalization no Has patient had a PCN reaction occurring within the last 10 years: no If all of the above answers are "NO", then may proceed with Cephalosporin use.  . Prednisone Palpitations     ROS:  The patient denies anorexia, fever, headaches,  vision changes, ear pain, sore throat, breast concerns, chest pain, palpitations, dizziness, syncope, dyspnea on exertion, cough, swelling, nausea, vomiting, diarrhea, constipation, abdominal pain, melena, hematochezia, indigestion/heartburn, hematuria, dysuria, vaginal bleeding, discharge, odor or itch, genital lesions, joint pains, numbness (just residual from stroke), tingling, weakness, tremor, suspicious skin lesions, depression, anxiety, abnormal bleeding/bruising (just bruising of hands/forearms), or enlarged lymph nodes. Pain--some pain in right shoulder, hands.  Currently 3/10 or less. Right knee hasn't been bothering her lately. Sciatica also has not been flaring recently. LE wound--improving with weekly wound clinic visits Incontinence (wears pads, overall controlled with medication and nerve treatments) Some hearing loss starting (trouble hearing sermon in church). She was recently at the beach and gained some weight.  Working on losing it again.  PHYSICAL EXAM:  BP (!) 108/52 (BP Location: Left Arm, Patient Position: Sitting, Cuff Size: Normal)   Pulse   60   Ht 5' 7" (1.702 m)   Wt 208 lb 12.8 oz (94.7 kg)   BMI 32.70 kg/m     Wt Readings from Last 3 Encounters:  02/02/17 206 lb (93.4 kg)  11/17/16 207 lb 9.6 oz (94.2 kg)  10/14/16 202 lb 8 oz (91.9 kg)   BP (!) 108/52 (BP Location: Left Arm, Patient Position: Sitting, Cuff Size: Normal)   Pulse 60   Ht 5' 7" (1.702 m)   Wt 208 lb 12.8 oz (94.7 kg)   BMI 32.70 kg/m   General Appearance:    Alert, cooperative, no distress, appears younger than stated age  Head:    Normocephalic, without obvious abnormality, atraumatic  Eyes:    PERRL, conjunctiva/corneas clear, EOM's intact, fundi    benign  Ears:    Normal TM's and external ear canals  Nose:   Nares normal, mucosa normal, no drainage or sinus   tenderness  Throat:   Lips, mucosa, and tongue normal; teeth and gums normal  Neck:   Supple, no lymphadenopathy;  thyroid:  no   enlargement/tenderness/nodules; no carotid bruit or JVD  Back:    Spine nontender, no curvature, ROM normal, no CVA     tenderness  Lungs:     Clear to auscultation bilaterally without wheezes, rales or     ronchi; respirations unlabored  Chest Wall:    No tenderness or deformity   Heart:    Regular rate and rhythm, S1 and S2 normal, no murmur, rub   or gallop  Breast Exam:    Deferred to GYN  Abdomen:     Soft, non-tender, nondistended, normoactive bowel sounds,    no masses, no hepatosplenomegaly  Genitalia:    Deferred to GYN     Extremities:   No clubbing, cyanosis or edema. Right lower leg is wrapped and bandaged.  Toes are visible--brisk capillary refill, warm. She has some discomfort with external rotation of the right hip  Pulses:   2+ and symmetric all extremities  Skin:   Skin color, texture, turgor normal, no rashes or lesions. Purpuric lesions and bruising noted on hands and forearms. (RLE skin not visualized).  Lymph nodes:   Cervical, supraclavicular nodes normal  Neurologic:   CNII-XII intact, normal strength, sensation and gait; reflexes 2+ and symmetric throughout          Psych:   Normal mood, affect, hygiene and  grooming.    Lab Results  Component Value Date   HGBA1C 5.9 02/16/2017    ASSESSMENT/PLAN:  Annual physical exam  Encounter for Medicare annual wellness exam  Osteopenia, unspecified location - add weight-bearing exercise.  Cont Ca, D. Recheck DEXA.  If no significant decline, no further f/u needed  Nonproliferative diabetic retinopathy (HCC)  Controlled type 2 diabetes mellitus with retinopathy, without long-term current use of insulin, macular edema presence unspecified, unspecified laterality, unspecified retinopathy severity (HCC) - diet controlled - Plan: HgB A1c, Comprehensive metabolic panel, Microalbumin / creatinine urine ratio  Immunization due - Plan: Pneumococcal polysaccharide vaccine 23-valent greater than or equal to 2yo subcutaneous/IM  Gout involving toe, unspecified cause, unspecified chronicity, unspecified laterality - no flares in years, off allopurinol. Will check uric acid one last time (and no need to continue to follow if no gout flares) - Plan: Uric Acid  Mixed hyperlipidemia - recheck today; elevated TG on last check. Reviewed proper diet, fish oil recommendations--will look for one without red dye - Plan: Lipid panel  Medication monitoring encounter - Plan: Lipid   panel, Comprehensive metabolic panel, CBC with Differential/Platelet  Essential hypertension, benign - controlled - Plan: amLODipine (NORVASC) 5 MG tablet  Senile purpura (HCC)   A1c, lipid, c-met ,CBC Uric acid Urine microalb   Discussed monthly self breast exams and yearly mammograms; at least 30 minutes of aerobic activity at least 5 days/week and weight-bearing exercise 2x/week; proper sunscreen use reviewed; healthy diet, including goals of calcium and vitamin D intake and alcohol recommendations (less than or equal to 1 drink/day) reviewed; regular seatbelt use; changing batteries in smoke detectors.  Immunization recommendations discussed--continue yeary flu shots.  shingrix recommended  (to get at pharmacy).  Pneumovax booster given.  Colonoscopy recommendations reviewed, due again 2020, but likely will not have, due to age, unless problems.  Pt asked to get us copies of her living will and healthcare POA. Full Code, Full care   [facility]. You can schedule this for the same day as your mammogram if you would like.  We discussed continuing your gabapentin since it doesn't seem to be causing any significant side effects.  If you question whether you truly need this anymore, you can try taking it every other day for 1-2 weeks, wean off, and restart it only if you have recurrent neuropathy symptoms (ie burning pain in the feet).    Medicare Attestation I have personally reviewed: The patient's medical and social history Their use of alcohol, tobacco or illicit drugs Their current medications and supplements The patient's functional ability including ADLs,fall risks, home safety risks, cognitive, and hearing and visual impairment Diet and physical activities Evidence for depression or mood disorders  The patient's weight, height, and BMI have been recorded in the chart.  I have made referrals, counseling, and provided education to the patient based on review of the above and I have provided the patient with a written personalized care plan for preventive services.      

## 2017-02-16 ENCOUNTER — Encounter: Payer: Self-pay | Admitting: *Deleted

## 2017-02-16 ENCOUNTER — Encounter: Payer: Self-pay | Admitting: Family Medicine

## 2017-02-16 ENCOUNTER — Ambulatory Visit (INDEPENDENT_AMBULATORY_CARE_PROVIDER_SITE_OTHER): Payer: Medicare Other | Admitting: Family Medicine

## 2017-02-16 ENCOUNTER — Other Ambulatory Visit: Payer: Self-pay | Admitting: Family Medicine

## 2017-02-16 VITALS — BP 108/52 | HR 60 | Ht 67.0 in | Wt 208.8 lb

## 2017-02-16 DIAGNOSIS — E11319 Type 2 diabetes mellitus with unspecified diabetic retinopathy without macular edema: Secondary | ICD-10-CM

## 2017-02-16 DIAGNOSIS — M109 Gout, unspecified: Secondary | ICD-10-CM

## 2017-02-16 DIAGNOSIS — I10 Essential (primary) hypertension: Secondary | ICD-10-CM

## 2017-02-16 DIAGNOSIS — E782 Mixed hyperlipidemia: Secondary | ICD-10-CM | POA: Diagnosis not present

## 2017-02-16 DIAGNOSIS — M8580 Other specified disorders of bone density and structure, unspecified site: Secondary | ICD-10-CM

## 2017-02-16 DIAGNOSIS — Z0000 Encounter for general adult medical examination without abnormal findings: Secondary | ICD-10-CM

## 2017-02-16 DIAGNOSIS — D692 Other nonthrombocytopenic purpura: Secondary | ICD-10-CM

## 2017-02-16 DIAGNOSIS — Z23 Encounter for immunization: Secondary | ICD-10-CM

## 2017-02-16 DIAGNOSIS — E113299 Type 2 diabetes mellitus with mild nonproliferative diabetic retinopathy without macular edema, unspecified eye: Secondary | ICD-10-CM | POA: Diagnosis not present

## 2017-02-16 DIAGNOSIS — Z5181 Encounter for therapeutic drug level monitoring: Secondary | ICD-10-CM | POA: Diagnosis not present

## 2017-02-16 LAB — POCT GLYCOSYLATED HEMOGLOBIN (HGB A1C): Hemoglobin A1C: 5.9

## 2017-02-16 MED ORDER — AMLODIPINE BESYLATE 5 MG PO TABS: 5.0000 mg | ORAL_TABLET | Freq: Every day | ORAL | 5 refills | Status: DC

## 2017-02-16 NOTE — Patient Instructions (Addendum)
HEALTH MAINTENANCE RECOMMENDATIONS:  It is recommended that you get at least 30 minutes of aerobic exercise at least 5 days/week (for weight loss, you may need as much as 60-90 minutes). This can be any activity that gets your heart rate up. This can be divided in 10-15 minute intervals if needed, but try and build up your endurance at least once a week.  Weight bearing exercise is also recommended twice weekly.  Eat a healthy diet with lots of vegetables, fruits and fiber.  "Colorful" foods have a lot of vitamins (ie green vegetables, tomatoes, red peppers, etc).  Limit sweet tea, regular sodas and alcoholic beverages, all of which has a lot of calories and sugar.  Up to 1 alcoholic drink daily may be beneficial for women (unless trying to lose weight, watch sugars).  Drink a lot of water.  Calcium recommendations are 1200-1500 mg daily (1500 mg for postmenopausal women or women without ovaries), and vitamin D 1000 IU daily.  This should be obtained from diet and/or supplements (vitamins), and calcium should not be taken all at once, but in divided doses.  Monthly self breast exams and yearly mammograms for women over the age of 54 is recommended.  Sunscreen of at least SPF 30 should be used on all sun-exposed parts of the skin when outside between the hours of 10 am and 4 pm (not just when at beach or pool, but even with exercise, golf, tennis, and yard work!)  Use a sunscreen that says "broad spectrum" so it covers both UVA and UVB rays, and make sure to reapply every 1-2 hours.  Remember to change the batteries in your smoke detectors when changing your clock times in the spring and fall.  Use your seat belt every time you are in a car, and please drive safely and not be distracted with cell phones and texting while driving.   Kristin Willis , Thank you for taking time to come for your Medicare Wellness Visit. I appreciate your ongoing commitment to your health goals. Please review the following  plan we discussed and let me know if I can assist you in the future.   These are the goals we discussed: Goals    None      This is a list of the screening recommended for you and due dates:  Health Maintenance  Topic Date Due  . Eye exam for diabetics  09/20/2015  . Hemoglobin A1C  02/16/2017  . Flu Shot  04/15/2017  . Complete foot exam   08/18/2017  . Colon Cancer Screening  02/02/2019  . Tetanus Vaccine  01/03/2027  . DEXA scan (bone density measurement)  Completed  . Pneumonia vaccines  Completed   You were given a pneumovax booster today.  I recommend getting the new shingles vaccine (Shingrix). You will need to check with your insurance to see if it is covered, and if covered by Medicare Part D, you need to get from the pharmacy rather than our office.  It is a series of 2 injections, spaced 2 months apart.  [facility]. You can schedule this for the same day as your mammogram if you would like.  We discussed continuing your gabapentin since it doesn't seem to be causing any significant side effects.  If you question whether you truly need this anymore, you can try taking it every other day for 1-2 weeks, wean off, and restart it only if you have  recurrent neuropathy symptoms (ie burning pain in the feet).  Please continue yearly diabetic eye exams--please have Dr. Zadie Rhine forward his notes to [name].  At your convenience, please get [name] a copy of your Living Will and healthcare power of attorney.

## 2017-02-17 LAB — LIPID PANEL
CHOL/HDL RATIO: 3.9 ratio (ref <5.0)
CHOLESTEROL: 139 mg/dL (ref <200)
HDL: 36 mg/dL — ABNORMAL LOW (ref >50)
LDL CALC: 49 mg/dL (ref <100)
TRIGLYCERIDES: 268 mg/dL — AB (ref <150)
VLDL: 54 mg/dL — AB (ref <30)

## 2017-02-17 LAB — CBC WITH DIFFERENTIAL/PLATELET
Basophils Absolute: 56 cells/uL (ref 0–200)
Basophils Relative: 1 %
EOS ABS: 280 {cells}/uL (ref 15–500)
Eosinophils Relative: 5 %
HEMATOCRIT: 42.1 % (ref 35.0–45.0)
Hemoglobin: 13.8 g/dL (ref 11.7–15.5)
LYMPHS ABS: 2352 {cells}/uL (ref 850–3900)
Lymphocytes Relative: 42 %
MCH: 32.8 pg (ref 27.0–33.0)
MCHC: 32.8 g/dL (ref 32.0–36.0)
MCV: 100 fL (ref 80.0–100.0)
MONO ABS: 616 {cells}/uL (ref 200–950)
MPV: 8.7 fL (ref 7.5–12.5)
Monocytes Relative: 11 %
NEUTROS ABS: 2296 {cells}/uL (ref 1500–7800)
NEUTROS PCT: 41 %
Platelets: 194 10*3/uL (ref 140–400)
RBC: 4.21 MIL/uL (ref 3.80–5.10)
RDW: 13.6 % (ref 11.0–15.0)
WBC: 5.6 10*3/uL (ref 4.0–10.5)

## 2017-02-17 LAB — COMPREHENSIVE METABOLIC PANEL
ALT: 9 U/L (ref 6–29)
AST: 18 U/L (ref 10–35)
Albumin: 3.6 g/dL (ref 3.6–5.1)
Alkaline Phosphatase: 35 U/L (ref 33–130)
BUN: 29 mg/dL — AB (ref 7–25)
CALCIUM: 9.3 mg/dL (ref 8.6–10.4)
CO2: 23 mmol/L (ref 20–31)
Chloride: 106 mmol/L (ref 98–110)
Creat: 0.87 mg/dL (ref 0.60–0.88)
Glucose, Bld: 98 mg/dL (ref 65–99)
POTASSIUM: 4.3 mmol/L (ref 3.5–5.3)
Sodium: 141 mmol/L (ref 135–146)
Total Bilirubin: 0.9 mg/dL (ref 0.2–1.2)
Total Protein: 6.4 g/dL (ref 6.1–8.1)

## 2017-02-17 LAB — MICROALBUMIN / CREATININE URINE RATIO
CREATININE, URINE: 95 mg/dL (ref 20–320)
Microalb Creat Ratio: 33 mcg/mg creat — ABNORMAL HIGH (ref <30)
Microalb, Ur: 3.1 mg/dL

## 2017-02-17 LAB — URIC ACID: URIC ACID, SERUM: 4.8 mg/dL (ref 2.5–7.0)

## 2017-02-19 ENCOUNTER — Encounter: Payer: Self-pay | Admitting: Family Medicine

## 2017-02-20 DIAGNOSIS — E11622 Type 2 diabetes mellitus with other skin ulcer: Secondary | ICD-10-CM | POA: Diagnosis not present

## 2017-02-27 DIAGNOSIS — E11622 Type 2 diabetes mellitus with other skin ulcer: Secondary | ICD-10-CM | POA: Diagnosis not present

## 2017-03-06 DIAGNOSIS — E11622 Type 2 diabetes mellitus with other skin ulcer: Secondary | ICD-10-CM | POA: Diagnosis not present

## 2017-03-12 ENCOUNTER — Other Ambulatory Visit: Payer: Self-pay | Admitting: Family Medicine

## 2017-03-12 DIAGNOSIS — I10 Essential (primary) hypertension: Secondary | ICD-10-CM

## 2017-03-18 ENCOUNTER — Other Ambulatory Visit: Payer: Self-pay | Admitting: Family Medicine

## 2017-03-18 DIAGNOSIS — I10 Essential (primary) hypertension: Secondary | ICD-10-CM

## 2017-03-29 ENCOUNTER — Other Ambulatory Visit: Payer: Self-pay | Admitting: Family Medicine

## 2017-04-06 ENCOUNTER — Telehealth: Payer: Self-pay | Admitting: Family Medicine

## 2017-04-06 NOTE — Telephone Encounter (Signed)
Pt left message needs new meter to test blood sugar,  I called her back but was unable to reach her to find out which kind she wants, also unable to leave a message

## 2017-04-06 NOTE — Telephone Encounter (Signed)
Tried to call pt but Mailbox is full and unable to leave a message, I will wait to see if pt will return call to office

## 2017-04-07 MED ORDER — FREESTYLE LIBRE SENSOR SYSTEM MISC: 2 refills | Status: DC

## 2017-04-07 MED ORDER — FREESTYLE LIBRE READER DEVI: 1.0000 | Freq: Every day | 0 refills | Status: DC

## 2017-04-07 NOTE — Telephone Encounter (Signed)
Pt was notified of results

## 2017-04-07 NOTE — Telephone Encounter (Signed)
Ok (don't know if insurance covers that type, but ok to try). You can state to check once daily for equipment purposes, but it is fine to check just once a week as long as they are good.

## 2017-04-07 NOTE — Telephone Encounter (Signed)
Spoke to patient and she states that her meter is broken and she needs a new one. Pt states she wants the new meter that you rub over your skin to get reading instead of prinking the finger. She states she test now a couple times a month. Is this okay

## 2017-04-07 NOTE — Telephone Encounter (Signed)
Spoke to pharmacy and its called the freestyle libre. Had to send in device and sensor as an order

## 2017-04-14 ENCOUNTER — Other Ambulatory Visit: Payer: Self-pay | Admitting: Family Medicine

## 2017-04-14 DIAGNOSIS — I10 Essential (primary) hypertension: Secondary | ICD-10-CM

## 2017-04-17 ENCOUNTER — Other Ambulatory Visit: Payer: Self-pay | Admitting: Family Medicine

## 2017-04-17 DIAGNOSIS — E7800 Pure hypercholesterolemia, unspecified: Secondary | ICD-10-CM

## 2017-04-20 ENCOUNTER — Other Ambulatory Visit: Payer: Self-pay | Admitting: Family Medicine

## 2017-04-20 DIAGNOSIS — Z1231 Encounter for screening mammogram for malignant neoplasm of breast: Secondary | ICD-10-CM

## 2017-05-04 ENCOUNTER — Telehealth: Payer: Self-pay

## 2017-05-04 NOTE — Telephone Encounter (Signed)
Spoke with pt- her BP today without BP meds are 135/62. She is drinking plenty of H2O. Pt states she would like to see you again Monday, pt is scheduled. She will keep record of her BP, and bring her monitor. Victorino December

## 2017-05-04 NOTE — Telephone Encounter (Signed)
7# in a week is a lot--makes me think she could be dehydrated (fluid losses; she does take a diuretic), which can also lead to low blood pressures.  The values provided are not enough for me to make a determination (mostly diastolic).  I would like for her to continue her medications, keep a record of her blood pressures, and schedule a f/u visit--to bring her list (and her monitor, if not previously checked for accuracy) to her visit.

## 2017-05-04 NOTE — Telephone Encounter (Signed)
Pt reports she has been losing weight- has lost 7 lbs in 1 week. Reports BP readings have dropped into the diastolic readings of 10-31. She stopped taking BP meds 2 days ago. Diastolic reading today was 62. Blood sugar was 130. Pt questions if she needs to continue her BP meds. CB # [phone].

## 2017-05-04 NOTE — Telephone Encounter (Signed)
LMTCB

## 2017-05-05 ENCOUNTER — Telehealth: Payer: Self-pay | Admitting: Family Medicine

## 2017-05-05 MED ORDER — GLUCOSE BLOOD VI STRP: ORAL_STRIP | 12 refills | Status: DC

## 2017-05-05 NOTE — Telephone Encounter (Signed)
Called in.

## 2017-05-05 NOTE — Telephone Encounter (Signed)
Pt is calling requesting a refill on her freestyle test strips pt uses  [id number] [place], [place] [address]

## 2017-05-08 ENCOUNTER — Telehealth: Payer: Self-pay

## 2017-05-08 MED ORDER — GLUCOSE BLOOD VI STRP: ORAL_STRIP | 12 refills | Status: DC

## 2017-05-08 NOTE — Telephone Encounter (Signed)
Pt needs refill of frestyle lite test strips [facility] rx sent [facility]. [place] December

## 2017-05-11 ENCOUNTER — Ambulatory Visit (INDEPENDENT_AMBULATORY_CARE_PROVIDER_SITE_OTHER): Payer: Medicare Other | Admitting: Family Medicine

## 2017-05-11 ENCOUNTER — Encounter: Payer: Self-pay | Admitting: Family Medicine

## 2017-05-11 VITALS — BP 114/60 | HR 64 | Ht 67.0 in | Wt 202.4 lb

## 2017-05-11 DIAGNOSIS — Z23 Encounter for immunization: Secondary | ICD-10-CM

## 2017-05-11 DIAGNOSIS — I10 Essential (primary) hypertension: Secondary | ICD-10-CM | POA: Diagnosis not present

## 2017-05-11 MED ORDER — LOSARTAN POTASSIUM 100 MG PO TABS: 100.0000 mg | ORAL_TABLET | Freq: Every day | ORAL | 1 refills | Status: DC

## 2017-05-11 NOTE — Progress Notes (Signed)
Chief Complaint  Patient presents with  . Hypertension    bp follow up.    She called 8/20 with concerns over low blood pressures (mainly diastolic). She had stopped her BP meds for 2 days due to low values and was questioning whether she should still take her medications.  Apparently she stopped all of the blood pressure medications for those 2 days.  She was advised to continue meds and f/u with her list of BP's and her monitor.  Her concern with that phone call was also due to losing 7#.  She is just starting the Next 56 days program, losing weight intentionally.  She brings in a list of her blood pressures. She had bad diarrhea after eating onions on Saturday. She was so weak yesterday she didn't go to church. She drank a lot of extra water with electrolytes, and feels better today. Stools were normal yesterday.  BP this morning was 97-99/49, pulse of 69-73. She did not feel weak or dizzy when these were taken this morning.  Other blood pressures have been fluctuating between 100-130's (a few 140's)/diastolic 10-62, max of 694/85 the day she had diarrhea--"had eaten some crackers that weren't on her diet").  Pulse 62-74.  Several days she states she has felt a little weak. Denies lightheaded/dizziness.  Prior to calling on 8/20 she had stopped all of the blood pressure medications for about 2 days.  BP on 8/20 was 147/76, pulse 88 (when off meds).  She has been going to water aerobics twice a week and is doing the Next 56 Day diet--hasn't started officially, but cut out sodium, eating more fruits (berries/apples)/vegetables.  Plans to continue with this plan.  Sugars have been running 100-107. She is having trouble with her new meter (only stayed on her arm for 3 days instead of 10). She thinks it likely fell off due to her water aerobics. She has been in contact with the company trying to get her money back.  PMH, PSH, SH reviewed  Outpatient Encounter Prescriptions as of 05/11/2017   Medication Sig Note  . Acetaminophen (TYLENOL ARTHRITIS PAIN PO) Take 2 tablets by mouth 2 (two) times daily.    Marland Kitchen amLODipine (NORVASC) 5 MG tablet Take 1 tablet (5 mg total) by mouth daily.   Marland Kitchen Apoaequorin (PREVAGEN PO) Take 1 tablet by mouth daily.   . Biotin 1000 MCG tablet Take 1,000 mcg by mouth 3 (three) times daily.   . Calcium Carbonate-Vitamin D (CALTRATE 600+D PO) Take 2 tablets by mouth 2 (two) times daily.    . carvedilol (COREG) 25 MG tablet TAKE 1 & 1/2 TABLETS BY MOUTH TWICE A DAY   . clopidogrel (PLAVIX) 75 MG tablet Take 1 tablet (75 mg total) by mouth daily.   . Coenzyme Q10 (CO Q 10 PO) Take 1 tablet by mouth daily.    . Evening Primrose Oil CAPS Take 1 capsule by mouth daily.     Marland Kitchen ezetimibe (ZETIA) 10 MG tablet TAKE 1 TABLET BY MOUTH EVERY DAY   . gabapentin (NEURONTIN) 300 MG capsule TAKE ONE CAPSULE AT BEDTIME   . Garlic (GARLIQUE PO) Take 1 capsule by mouth daily.   . hydrochlorothiazide (HYDRODIURIL) 12.5 MG tablet TAKE 1 TABLET BY MOUTH EVERY DAY   . KLOR-CON M10 10 MEQ tablet TAKE 1 TABLET (10 MEQ TOTAL) BY MOUTH DAILY.   Marland Kitchen losartan (COZAAR) 100 MG tablet Take 1 tablet (100 mg total) by mouth daily.   . Misc Natural Products (OSTEO BI-FLEX ADV  TRIPLE ST PO) Take 2 capsules by mouth daily.   . Multiple Vitamin (MULTIVITAMIN) tablet Take 1 tablet by mouth daily. 04/18/2013: Takes a vitamin pack for diabetics  . Needles & Syringes MISC 1 each by Does not apply route once. Pt uses freestyles lancets   . Omega-3 Fatty Acids (FISH OIL) 1000 MG CAPS Take 1 capsule by mouth 2 (two) times daily. 05/11/2017: Found one that she can take  . solifenacin (VESICARE) 5 MG tablet Take 5 mg by mouth daily.    Marland Kitchen Specialty Vitamins Products ([facility] HAIR/SKIN/NAILS PO) Take 2 each by mouth daily.   . [DISCONTINUED] losartan (COZAAR) 100 MG tablet TAKE 1 TABLET BY MOUTH TWICE A DAY   . cephALEXin (KEFLEX) 500 MG capsule Take 1 tablet prior to dental procedure, take second tablet evening after  dental procedure (Patient not taking: Reported on 02/02/2017)   . [DISCONTINUED] Continuous Blood Gluc Receiver (FREESTYLE LIBRE READER) DEVI 1 Device by Other route daily. Test once daily   . [DISCONTINUED] Continuous Blood Gluc Sensor (Stanley) MISC Test once a day.   . [DISCONTINUED] glucose blood test strip Test Bid Dx code E11.9   . [DISCONTINUED] Krill Oil 500 MG CAPS Take 1 capsule by mouth daily.    No facility-administered encounter medications on file as of 05/11/2017.    (taking losartan BID prior to visit)  Allergies  Allergen Reactions  . Ciprofloxacin Nausea And Vomiting  . Codeine Nausea And Vomiting  . Contrast Media [Iodinated Diagnostic Agents] Nausea And Vomiting  . Morphine And Related Nausea And Vomiting  . Statins Other (See Comments)    lethargic  . Toprol Xl [Metoprolol Tartrate]     Symptoms of heart attack, per pt, told not to take it again (?chest pain)  . Clindamycin/Lincomycin Itching and Rash  . Penicillins Hives, Swelling and Rash    Has patient had a PCN reaction causing immediate rash, facial/tongue/throat swelling, SOB or lightheadedness with hypotension: no Has patient had a PCN reaction causing severe rash ivolving mucus membranes or skin necrosis: no Has patient had a PCN reaction that required hospitalization no Has patient had a PCN reaction occurring within the last 10 years: no If all of the above answers are "NO", then may proceed with Cephalosporin use.  . Prednisone Palpitations    ROS: No fever, chills, headaches, dizziness, abdominal pain.  Diarrhea resolved.  No nausea, vomiting. No bleeding, bruising, rash.  No further knee pain. No urinary complaints (frequency due to drinking a lot of fluid); no longer having sciatica.  Moods are good.  PHYSICAL EXAM:  BP 114/60 (BP Location: Left Arm, Patient Position: Sitting, Cuff Size: Normal)   Pulse 64   Ht 5\' 7"  (1.702 m)   Wt 202 lb 6.4 oz (91.8 kg)   BMI 31.70 kg/m    Wt Readings from Last 3 Encounters:  05/11/17 202 lb 6.4 oz (91.8 kg)  02/16/17 208 lb 12.8 oz (94.7 kg)  02/02/17 206 lb (93.4 kg)   Pt's monitor 119/63 P 63, nurse 114/60 pulse 64  Pleasant, talkative, well-appearing elderly female in no distress HEENT: conjunctiva and sclera clear, OP with moist mucus membranes Neck: no lymphadenopathy or mass Heart: regular rate and rhythm Lungs: clear bilaterally Abdomen: soft, nontender, no mass Extremities: no edema Neuro: alert and oriented, cranial nerves intact.  Normal strength.  Skin: normal turgor, no rash Psych: normal mood, affect, hygiene and grooming  ASSESSMENT/PLAN:  Essential hypertension, benign - controlled, a little lower than needed  since cutting out sodium, regular exercise, wt loss.  Decrease losartan to qd, cont all other BP meds - Plan: losartan (COZAAR) 100 MG tablet  Need for influenza vaccination - Plan: Flu vaccine HIGH DOSE PF (Fluzone High dose)   Your blood pressure has been running a little lower than it needs to be. I think we can try adjusting some of your medications to keep it just a little higher, to avoid you feeling weak. Goal blood pressure 110-130/60-70 range I don't want to see your top number under 100 on a regular basis. If you see this, we need to further make medication adjustments.  For now--continue the amlodipine, carvedilol, HCTZ and the potassium (klorcon) with it. DECREASE the losartan from 100mg  twice daily to taking it just ONCE DAILY.  Please mail [name] (or fax) a copy of your blood pressure list next month.  Contact us sooner with any problems.  F/u Med check in December

## 2017-05-11 NOTE — Patient Instructions (Addendum)
  Your blood pressure has been running a little lower than it needs to be. I think we can try adjusting some of your medications to keep it just a little higher, to avoid you feeling weak. Goal blood pressure 110-130/60-70 range I don't want to see your top number under 100 on a regular basis. If you see this, we need to further make medication adjustments.  For now--continue the amlodipine, carvedilol, HCTZ and the potassium (klorcon) with it. DECREASE the losartan from 100mg  twice daily to taking it just ONCE DAILY.  Please mail [name] (or fax) a copy of your blood pressure list next month.  Contact us sooner with any problems.   I recommend getting the new shingles vaccine (Shingrix). You will need to check with your insurance to see if it is covered, and if covered by Medicare Part D, you need to get from the pharmacy rather than our office.  It is a series of 2 injections, spaced 2 months apart. This is currently on manufacturer on backorder.  I would wait until you either see commercials on TV or Jan/Feb 2019 when it becomes more available before starting the series. You need to be able to get both shots within a 6 month period (recommend is 2 months apart).

## 2017-05-27 ENCOUNTER — Ambulatory Visit
Admission: RE | Admit: 2017-05-27 | Discharge: 2017-05-27 | Disposition: A | Discharge status: Home / Self Care | Payer: Medicare Other | Source: Ambulatory Visit | Attending: Family Medicine | Admitting: Family Medicine

## 2017-05-27 DIAGNOSIS — Z1231 Encounter for screening mammogram for malignant neoplasm of breast: Secondary | ICD-10-CM

## 2017-05-29 ENCOUNTER — Other Ambulatory Visit: Payer: Self-pay | Admitting: Family Medicine

## 2017-05-29 DIAGNOSIS — R928 Other abnormal and inconclusive findings on diagnostic imaging of breast: Secondary | ICD-10-CM

## 2017-06-05 ENCOUNTER — Ambulatory Visit: Payer: Medicare Other

## 2017-06-05 ENCOUNTER — Ambulatory Visit
Admission: RE | Admit: 2017-06-05 | Discharge: 2017-06-05 | Disposition: A | Discharge status: Home / Self Care | Payer: Medicare Other | Source: Ambulatory Visit | Attending: Family Medicine | Admitting: Family Medicine

## 2017-06-05 DIAGNOSIS — R928 Other abnormal and inconclusive findings on diagnostic imaging of breast: Secondary | ICD-10-CM

## 2017-06-14 ENCOUNTER — Other Ambulatory Visit: Payer: Self-pay | Admitting: Family Medicine

## 2017-06-14 DIAGNOSIS — I10 Essential (primary) hypertension: Secondary | ICD-10-CM

## 2017-06-19 ENCOUNTER — Other Ambulatory Visit: Payer: Self-pay | Admitting: Family Medicine

## 2017-06-19 DIAGNOSIS — I10 Essential (primary) hypertension: Secondary | ICD-10-CM

## 2017-07-02 ENCOUNTER — Telehealth: Payer: Self-pay | Admitting: Family Medicine

## 2017-07-02 MED ORDER — ACCU-CHEK GUIDE W/DEVICE KIT: 1.0000 | PACK | Freq: Two times a day (BID) | 0 refills | Status: DC

## 2017-07-02 MED ORDER — LANCETS MICRO THIN 33G MISC: 1.0000 | Freq: Two times a day (BID) | 2 refills | Status: DC

## 2017-07-02 MED ORDER — GLUCOSE BLOOD VI STRP: 1.0000 | ORAL_STRIP | Freq: Two times a day (BID) | 2 refills | Status: DC

## 2017-07-02 NOTE — Telephone Encounter (Signed)
Pt called and stated that her blood sugar monitor is no longer working. She was advised to ask her insurance what is the preferred. Pt called back and stated that Accu Check or One touch is what they will cover. [facility] [address] [facility] [place].

## 2017-07-02 NOTE — Telephone Encounter (Signed)
Done

## 2017-08-07 ENCOUNTER — Other Ambulatory Visit: Payer: Self-pay | Admitting: Family Medicine

## 2017-08-07 DIAGNOSIS — I10 Essential (primary) hypertension: Secondary | ICD-10-CM

## 2017-08-10 ENCOUNTER — Telehealth: Payer: Self-pay | Admitting: *Deleted

## 2017-08-10 NOTE — Telephone Encounter (Signed)
Patient coming in next week at 2:30 for med check, wants to know if she needs to be fasting, I could not figure out from last visit or lab results.

## 2017-08-10 NOTE — Telephone Encounter (Signed)
Yes, fasting (TG high on last lipids 6 mos ago)

## 2017-08-16 NOTE — Progress Notes (Signed)
Chief Complaint  Patient presents with  . Hypertension    fasting med check. [facility]Z [facility]. Wants to shingrix vaccine and bone density.     Hypertension:  Medication was adjusted at her last visit 3 months ago, due to some low BP's.  She was continued on amlodipine, carvedilol, HCTZ and the potassium (klorcon), but losartan was decreased from 100mg BID to just once daily. BP's have been running 87-110/42-62. This is using her son-in-law's monitor, and was verified as accurate today (Nurse got 130/80, machine was 134/85). Denies any lightheadedness, dizziness. She checks it in her left arm (she has h/o left-sided breast cancer, but frozen shoulder on right, and it is easier). She has some fatigue and "emotional things" going on with her son. Denies any symptoms related to the lower blood pressures she has seen.  She fell at [employer] store last month--lost balance stepping on curb. Hit head, coccyx and wrist.  Went to UC in [place]. No fractures, no concussion.  Her coccyx is finally starting to feel better.    Diabetes: Controlled by diet, she has never taken medications.Checks sugars about once a week, and are running 90-116. She has been on the Next 56 Day diet, no sugars are carbs.  She has lost weight (20# per pt). Last diabetic eye exam was 09/2016. Denies polydipsia (some dry mouth from meds, no changes), polyuria (drinks a lot, no changes). She has tingling in her left foot since her minor stroke, unchanged.  Left breast cancer: She "graduated" from going to the oncologist, after going for 7 years. Last visit with Dr. Magrinat was in September 2015. She [facility] ([facility], required spot compression, which was normal), and see Dr. Neal yearly for her breast (and pelvic) exams. She has prolapse of her bladder, and she tried pessaries, but they fall out, and Dr. Neal didn't want to operate. She gets  occasional UTI's which she relates to the prolapse (none recently). Currently denies any urinary symptoms.   OAB--seen at Dr. McDiarmid's office monthly for transcutaneous nerve stimulation treatments (sees him just 1-2x/year). She isn't sure if she is going to continue (has been going x 5 years ,per pt). She wears pads all the time, and special underwear at night, getting up 3x/night.  Overall doing much better on the treatments and the Vesicare. She is asking if there are any newer meds she can try. She believes she has already tried Myrbetriq (samples from urologist).  Hyperlipidemia-- She is taking a fish oil capsule that she found that does not have red dye, 1000mg, taking 1 daily. Trying to follow a low fat diet. She limits her carbs, sweets, fried foods. Snacks on cheese (no longer having crackers, since on Next 56d diet). Compliant with Zetia. She is fasting for labs today. Last labs showed elevated TG, low HDL. Lab Results  Component Value Date   CHOL 139 02/16/2017   HDL 36 (L) 02/16/2017   LDLCALC 49 02/16/2017   TRIG 268 (H) 02/16/2017   CHOLHDL 3.9 02/16/2017    She has been taking Prevagen for at least 2-3years to help with memory, and she finds it effective. She notices improvement--easier to recall names. She is very busy, but keeps up with everything by keeping an organized, detailed calendar.Some days are better than others. Denies any memory concerns--doesn't lose keys, car, get lost driving.   H/o CVA, on Plavix.  She has residual numbness of   L foot. Also felt to have a mild peripheral neuropathy, possibly related to her diabetes (diet controlled). She gets regular pedicures, doesn't see podiatrist (went to one once but didn't like them). She remains on gabapentin (started by Dr. Magrinat in the past). She denies any pain in her foot, just a mild tingling.  She remains onPlavix. Denies any bleeding or side effects (some bruising on hands, forearms). She sees Dr. Willis  once yearly, in January.  Gout: She hasn't had a flare in many years. She previously took allopurinol, but it was stopped over ayear ago. No flares since stopping it.  Lab Results  Component Value Date   LABURIC 4.8 02/16/2017    Osteopenia: Vitamin D level was normal at 38 in 07/2015.  Last DEXA was 04/2014, mild osteopenia noted, but worsening compared to baseline and prior. She has an order in the computer--needs to schedule (was to schedule for 07/2017)--she still didn't schedule, wasn't aware that she needed to schedule it herself. DEXA 04/2014: Left FOREARM T Score: -1.1; Left FEMUR T-Score: -1.4 ASSESSMENT: Patient's diagnostic category is low bone mass by WHO Criteria. COMPARISON: Statistically significant worsening bone mineral density when compared with previous and baseline.  Membranous nephropathy--sees Dr. Dunham once yearly, last seen last month (no records in chart).  She continues to be in remission.  Doing well.  She is asking if she needs any immunizations. She hasn't yet gotten Shingrix, has questions.  She reports she did NOT due AWV through [facility] this year, would like to do here.  Immunization History  Administered Date(s) Administered  . Influenza Split 06/14/2012, 06/09/2013, 05/30/2014  . Influenza Whole 07/06/2002, 06/28/2008, 06/25/2011  . Influenza, High Dose Seasonal PF 05/11/2017  . Influenza-Unspecified 05/31/2015, 05/27/2016  . Pneumococcal Conjugate-13 01/09/2014  . Pneumococcal Polysaccharide-23 04/05/2007, 02/16/2017  . Tdap 04/05/2007, 01/02/2017  . Zoster 09/15/2004   PMH, PSH, SH reviewed  Outpatient Encounter Medications as of 08/17/2017  Medication Sig Note  . Acetaminophen (TYLENOL ARTHRITIS PAIN PO) Take 2 tablets by mouth 2 (two) times daily.    . amLODipine (NORVASC) 5 MG tablet Take 1 tablet (5 mg total) by mouth daily.   . Apoaequorin (PREVAGEN PO) Take 1 tablet by mouth daily.   . Biotin 1000 MCG tablet Take 1,000 mcg by  mouth 3 (three) times daily.   . Blood Glucose Monitoring Suppl (ACCU-CHEK GUIDE) w/Device KIT 1 each by Does not apply route 2 (two) times daily.   . Calcium Carbonate-Vitamin D (CALTRATE 600+D PO) Take 2 tablets by mouth 2 (two) times daily.    . carvedilol (COREG) 25 MG tablet TAKE 1 & 1/2 TABLETS BY MOUTH TWICE A DAY   . clopidogrel (PLAVIX) 75 MG tablet Take 1 tablet (75 mg total) by mouth daily.   . Coenzyme Q10 (CO Q 10 PO) Take 1 tablet by mouth daily.    . Evening Primrose Oil CAPS Take 1 capsule by mouth daily.     . ezetimibe (ZETIA) 10 MG tablet TAKE 1 TABLET BY MOUTH EVERY DAY   . gabapentin (NEURONTIN) 300 MG capsule TAKE ONE CAPSULE AT BEDTIME   . glucose blood (ACCU-CHEK GUIDE) test strip 1 each by Other route 2 (two) times daily. Use as instructed   . hydrochlorothiazide (HYDRODIURIL) 12.5 MG tablet Take 1 tablet (12.5 mg total) by mouth daily.   . KLOR-CON M10 10 MEQ tablet TAKE 1 TABLET (10 MEQ TOTAL) BY MOUTH DAILY.   . LANCETS MICRO THIN 33G MISC 1 each by Does not   apply route 2 (two) times daily.   . losartan (COZAAR) 100 MG tablet Take 1 tablet (100 mg total) by mouth daily.   . Misc Natural Products (OSTEO BI-FLEX ADV TRIPLE ST PO) Take 2 capsules by mouth daily.   . Multiple Vitamin (MULTIVITAMIN) tablet Take 1 tablet by mouth daily. 04/18/2013: Takes a vitamin pack for diabetics  . Needles & Syringes MISC 1 each by Does not apply route once. Pt uses freestyles lancets   . Omega-3 Fatty Acids (FISH OIL) 1000 MG CAPS Take 1 capsule by mouth 2 (two) times daily. 05/11/2017: Found one that she can take  . solifenacin (VESICARE) 5 MG tablet Take 5 mg by mouth daily.    . Specialty Vitamins Products ([facility] HAIR/SKIN/NAILS PO) Take 2 each by mouth daily.   . [DISCONTINUED] Garlic (GARLIQUE PO) Take 1 capsule by mouth daily.   . [DISCONTINUED] hydrochlorothiazide (HYDRODIURIL) 12.5 MG tablet TAKE 1 TABLET BY MOUTH EVERY DAY   . [DISCONTINUED] losartan (COZAAR) 100 MG tablet Take 1  tablet (100 mg total) by mouth daily.   . cephALEXin (KEFLEX) 500 MG capsule Take 1 tablet prior to dental procedure, take second tablet evening after dental procedure (Patient not taking: Reported on 02/02/2017)   . [DISCONTINUED] losartan (COZAAR) 100 MG tablet TAKE 1 TABLET BY MOUTH TWICE A DAY    No facility-administered encounter medications on file as of 08/17/2017.    ROS:  No fever, chills, URI symptoms, headaches, dizziness, chest pain, shortness of breath, cough, nausea, vomiting, bowel changes, dysuria, +incontinence per HPI, stable.  Denies bleeding, bruising, rash, depression, insomnia.  Stress related to her son. 15# weight loss since last visit, intentional.  Fall as per HPI, coccyx pain resolving. Denies feeling off balanced.   PHYSICAL EXAM:  BP 130/80   Pulse 60   Ht 5' 7" (1.702 m)   Wt 187 lb (84.8 kg)   BMI 29.29 kg/m   136/68 on repeat by MD, RA  Wt Readings from Last 3 Encounters:  08/17/17 187 lb (84.8 kg)  05/11/17 202 lb 6.4 oz (91.8 kg)  02/16/17 208 lb 12.8 oz (94.7 kg)   Well appearing, pleasant female, in good spirits, talkative. HEENT: conjunctiva and sclera clear, EOMI, OP with moist mucus membranes Neck: no lymphadenopathy or mass Heart: regular rate and rhythm Lungs: clear bilaterally Abdomen: soft, nontender, no mass Back: no spinal or CVA tenderness Extremities: no edema, palpable pulses. There are callouses along the distal portions of left 2nd, 3rd and 4th toes.  Decreased monofilament sensation even on the non-calloused portions of these toes.  Normal sensation on the right.  Thickened, onychomycotic nails are present bilaterally. Neuro: alert and oriented, cranial nerves intact.  Normal strength.  Skin: normal turgor, no rash Psych: normal mood, affect, hygiene and grooming   Lab Results  Component Value Date   HGBA1C 5.4 08/17/2017    ASSESSMENT/PLAN:  Essential hypertension, benign - low at home, asymptomatic, normal here.  Continue current meds. Plant to back off if any dizziness, or low in office - Plan: Comprehensive metabolic panel, hydrochlorothiazide (HYDRODIURIL) 12.5 MG tablet, losartan (COZAAR) 100 MG tablet  Osteopenia, unspecified location - due for DEXA--advised order is in system, and she should call [facility] to schedule  Nonproliferative diabetic retinopathy (HCC) - reminded to schedule annual diabetic eye exam for January  Controlled type 2 diabetes mellitus with retinopathy, without long-term current use of insulin, macular edema presence unspecified, unspecified laterality, unspecified retinopathy severity (HCC) - A1c improved with cutting   back carbs and sugar, weight loss - Plan: HgB A1c, Comprehensive metabolic panel, TSH  Mixed hyperlipidemia - Plan: Lipid panel, Comprehensive metabolic panel  Gout involving toe, unspecified cause, unspecified chronicity, unspecified laterality - no flares, off allopurinol, with normal uric acid  Other diabetic neurological complication associated with type 2 diabetes mellitus ([place]) - Plan: Ambulatory referral to Podiatry   Schedule DEXA (order in system) Eye exam due January  Lipids, A1c, c-met, TSH (last 10/2016, normal) No uric acid level needed, since no gout flares since last check, with normal levels   GET REPORT FROM [facility], saw in November.  F/u for AWV in 6-7 months. Reassured that her immunizations are UTD, other than Shingrix. Counseled re: risks/side effects/benefits, and encouraged her to get vaccine from pharmacy.  Will need CBC then with other labs at her AWV (yearly)  Referred to podiatrist for neuropathy, thickened nails.

## 2017-08-17 ENCOUNTER — Telehealth: Payer: Self-pay | Admitting: Family Medicine

## 2017-08-17 ENCOUNTER — Encounter: Payer: Self-pay | Admitting: Family Medicine

## 2017-08-17 ENCOUNTER — Ambulatory Visit: Payer: Medicare Other | Admitting: Family Medicine

## 2017-08-17 VITALS — BP 130/80 | HR 60 | Ht 67.0 in | Wt 187.0 lb

## 2017-08-17 DIAGNOSIS — M8580 Other specified disorders of bone density and structure, unspecified site: Secondary | ICD-10-CM

## 2017-08-17 DIAGNOSIS — E113299 Type 2 diabetes mellitus with mild nonproliferative diabetic retinopathy without macular edema, unspecified eye: Secondary | ICD-10-CM | POA: Diagnosis not present

## 2017-08-17 DIAGNOSIS — M109 Gout, unspecified: Secondary | ICD-10-CM | POA: Diagnosis not present

## 2017-08-17 DIAGNOSIS — E1149 Type 2 diabetes mellitus with other diabetic neurological complication: Secondary | ICD-10-CM | POA: Diagnosis not present

## 2017-08-17 DIAGNOSIS — E11319 Type 2 diabetes mellitus with unspecified diabetic retinopathy without macular edema: Secondary | ICD-10-CM | POA: Diagnosis not present

## 2017-08-17 DIAGNOSIS — I10 Essential (primary) hypertension: Secondary | ICD-10-CM

## 2017-08-17 DIAGNOSIS — E782 Mixed hyperlipidemia: Secondary | ICD-10-CM

## 2017-08-17 LAB — POCT GLYCOSYLATED HEMOGLOBIN (HGB A1C): Hemoglobin A1C: 5.4

## 2017-08-17 MED ORDER — LOSARTAN POTASSIUM 100 MG PO TABS: 100.0000 mg | ORAL_TABLET | Freq: Every day | ORAL | 6 refills | Status: DC

## 2017-08-17 MED ORDER — HYDROCHLOROTHIAZIDE 12.5 MG PO TABS: 12.5000 mg | ORAL_TABLET | Freq: Every day | ORAL | 6 refills | Status: DC

## 2017-08-17 NOTE — Patient Instructions (Addendum)
Please go for your diabetic yearly eye exam in January, and make sure they send [name] a report.  I recommend getting the new shingles vaccine (Shingrix). You will need to check with your insurance to see if it is covered, and if covered by Medicare Part D, you need to get from the pharmacy rather than our office.  It is a series of 2 injections, spaced 2 months apart.  Please call [facility] to schedule your bone density test (due now).  In general, you should always have your blood pressure checked on your right arm (due to breast surgery on the left). This shouldn't be too difficult, as you don't need to raise your arm overhead, just rest the elbow on your table.  Your blood pressure in the office is much higher than when you check it at home, therefore I am not recommending any medication changes.  Continue all medications the same.   I highly encourage you to schedule with a podiatrist.  You have some decreased sensation on the foot, along with callouses, and would benefit from seeing a podiatrist regularly for care of the feet/nails. I put in a referral for [facility] in [place]. Give them a call if you don't hear from them in a few days.

## 2017-08-17 NOTE — Telephone Encounter (Signed)
[facility] [place] [place] req Hyrdrochlorot tab 12.5 mg

## 2017-08-17 NOTE — Telephone Encounter (Signed)
Also refill reg for Losartan pot tab 100 mg #60

## 2017-08-17 NOTE — Telephone Encounter (Signed)
Patient has med check and Dr.Knapp will at visit.

## 2017-08-18 LAB — COMPREHENSIVE METABOLIC PANEL
AG Ratio: 1.4 (calc) (ref 1.0–2.5)
ALBUMIN MSPROF: 3.8 g/dL (ref 3.6–5.1)
ALKALINE PHOSPHATASE (APISO): 38 U/L (ref 33–130)
ALT: 11 U/L (ref 6–29)
AST: 19 U/L (ref 10–35)
BUN: 25 mg/dL (ref 7–25)
CO2: 30 mmol/L (ref 20–32)
CREATININE: 0.85 mg/dL (ref 0.60–0.88)
Calcium: 10.3 mg/dL (ref 8.6–10.4)
Chloride: 102 mmol/L (ref 98–110)
Globulin: 2.8 g/dL (calc) (ref 1.9–3.7)
Glucose, Bld: 98 mg/dL (ref 65–99)
Potassium: 4 mmol/L (ref 3.5–5.3)
Sodium: 140 mmol/L (ref 135–146)
Total Bilirubin: 0.8 mg/dL (ref 0.2–1.2)
Total Protein: 6.6 g/dL (ref 6.1–8.1)

## 2017-08-18 LAB — LIPID PANEL
CHOL/HDL RATIO: 3.4 (calc) (ref <5.0)
Cholesterol: 141 mg/dL (ref <200)
HDL: 42 mg/dL — AB (ref >50)
LDL CHOLESTEROL (CALC): 77 mg/dL
NON-HDL CHOLESTEROL (CALC): 99 mg/dL (ref <130)
TRIGLYCERIDES: 135 mg/dL (ref <150)

## 2017-08-18 LAB — TSH: TSH: 0.87 m[IU]/L (ref 0.40–4.50)

## 2017-08-20 NOTE — Progress Notes (Signed)
Called over to [facility] and patient has not been since Nov 2017 and does not have any appts scheduled.

## 2017-08-24 ENCOUNTER — Encounter: Payer: Medicare Other | Admitting: Family Medicine

## 2017-08-26 ENCOUNTER — Other Ambulatory Visit: Payer: Self-pay | Admitting: Family Medicine

## 2017-08-26 DIAGNOSIS — I10 Essential (primary) hypertension: Secondary | ICD-10-CM

## 2017-08-28 ENCOUNTER — Other Ambulatory Visit: Payer: Self-pay | Admitting: Family Medicine

## 2017-09-18 ENCOUNTER — Other Ambulatory Visit: Payer: Self-pay | Admitting: Family Medicine

## 2017-09-18 DIAGNOSIS — I10 Essential (primary) hypertension: Secondary | ICD-10-CM

## 2017-09-22 ENCOUNTER — Other Ambulatory Visit: Payer: Self-pay | Admitting: Family Medicine

## 2017-09-22 DIAGNOSIS — I10 Essential (primary) hypertension: Secondary | ICD-10-CM

## 2017-09-22 NOTE — Telephone Encounter (Signed)
Is this ok to refill?  

## 2017-10-02 ENCOUNTER — Ambulatory Visit
Admission: RE | Admit: 2017-10-02 | Discharge: 2017-10-02 | Disposition: A | Discharge status: Home / Self Care | Payer: Medicare Other | Source: Ambulatory Visit | Attending: Family Medicine | Admitting: Family Medicine

## 2017-10-02 ENCOUNTER — Encounter: Payer: Self-pay | Admitting: Family Medicine

## 2017-10-02 DIAGNOSIS — M8580 Other specified disorders of bone density and structure, unspecified site: Secondary | ICD-10-CM

## 2017-10-06 LAB — HM DIABETES EYE EXAM

## 2017-10-09 ENCOUNTER — Other Ambulatory Visit: Payer: Self-pay | Admitting: Family Medicine

## 2017-10-09 DIAGNOSIS — E7800 Pure hypercholesterolemia, unspecified: Secondary | ICD-10-CM

## 2017-10-12 ENCOUNTER — Ambulatory Visit: Payer: Medicare Other | Admitting: Podiatry

## 2017-10-12 ENCOUNTER — Encounter: Payer: Self-pay | Admitting: Podiatry

## 2017-10-12 VITALS — BP 128/61 | HR 59

## 2017-10-12 DIAGNOSIS — I878 Other specified disorders of veins: Secondary | ICD-10-CM | POA: Diagnosis not present

## 2017-10-12 DIAGNOSIS — I872 Venous insufficiency (chronic) (peripheral): Secondary | ICD-10-CM

## 2017-10-12 DIAGNOSIS — E1151 Type 2 diabetes mellitus with diabetic peripheral angiopathy without gangrene: Secondary | ICD-10-CM

## 2017-10-12 DIAGNOSIS — D689 Coagulation defect, unspecified: Secondary | ICD-10-CM | POA: Diagnosis not present

## 2017-10-12 NOTE — Progress Notes (Signed)
Subjective:  Patient ID: Kristin Willis, female    DOB: Jan 22, 1929,  MRN: [id number]  Chief Complaint  Patient presents with  . Diabetes    Diabetic foot exam   . Peripheral Neuropathy    numbness abd tingeling in left foot and leg    84 y.o. female presents with the above complaint.  Ports diabetes controlled with diet.  Did not check sugars this a.m. does not routinely check them.  Complains of numbness in left foot that is due to a stroke 2 years ago.  Last saw her PCP Dr. Tomi Bamberger 08/2017 states that she receives manicures and pedicures monthly for nail care.  Reports history of traumatic laceration to the right lower leg for which she underwent wound care due to delayed wound healing. Past Medical History:  Diagnosis Date  . Abnormality of gait 07/21/2016  . Arthritis    right knee, left wrist  . Asthma    related to allergies when working; resolved  . Breast cancer ([place]) 7/08 Inrasine ductal L breast  . Colon polyps    4/09--adenomatous. 04/2010--normal.  Repeat 2014  . DDD (degenerative disc disease), lumbar 4/06  . Diabetes mellitus    diet controlled  . Diabetic retinopathy   . DJD (degenerative joint disease)   . Dyslipidemia   . Endometrial cancer ([facility]) hx  . Gout   . Hepatitis A history(in [place]-1950's)  . History of stroke 10/24/2013  . Hypertension   . Incontinence   . Membranous glomerulonephropathy    [name]  . Mini stroke [employer]) DrWillis  . Obesity, unspecified   . Osteopenia L hip, 4/06  . Ramsay Hunt cerebellar syndrome ([place]) 07/21/2016  . Skin cancer    [name]  . Sliding hiatal hernia small sliding hiatal hernia  . Wears glasses    Past Surgical History:  Procedure Laterality Date  . ABDOMINAL HYSTERECTOMY     endometrial cancer  . APPENDECTOMY    . BREAST LUMPECTOMY Left 8/08 L Breast ([name])  . CATARACT EXTRACTION    . COLONOSCOPY  8/08 [name] (normal)  . kidney stones    . left middle finger    . REPLACEMENT TOTAL KNEE  Left  .  SKIN CANCER EXCISION    . TOTAL KNEE ARTHROPLASTY     left    Current Outpatient Medications:  .  Acetaminophen (TYLENOL ARTHRITIS PAIN PO), Take 2 tablets by mouth 2 (two) times daily. , Disp: , Rfl:  .  amLODipine (NORVASC) 5 MG tablet, TAKE 1 TABLET BY MOUTH EVERY DAY, Disp: 30 tablet, Rfl: 5 .  Apoaequorin (PREVAGEN PO), Take 1 tablet by mouth daily., Disp: , Rfl:  .  Biotin 1000 MCG tablet, Take 1,000 mcg by mouth 3 (three) times daily., Disp: , Rfl:  .  Blood Glucose Monitoring Suppl (ACCU-CHEK GUIDE) w/Device KIT, 1 each by Does not apply route 2 (two) times daily., Disp: 1 kit, Rfl: 0 .  Calcium Carbonate-Vitamin D (CALTRATE 600+D PO), Take 2 tablets by mouth 2 (two) times daily. , Disp: , Rfl:  .  carvedilol (COREG) 25 MG tablet, TAKE 1 & 1/2 TABLETS BY MOUTH TWICE A DAY, Disp: 135 tablet, Rfl: 1 .  cephALEXin (KEFLEX) 500 MG capsule, Take 1 tablet prior to dental procedure, take second tablet evening after dental procedure, Disp: 2 capsule, Rfl: 0 .  clopidogrel (PLAVIX) 75 MG tablet, Take 1 tablet (75 mg total) by mouth daily., Disp: 90 tablet, Rfl: 3 .  Coenzyme Q10 (CO Q 10  PO), Take 1 tablet by mouth daily. , Disp: , Rfl:  .  Evening Primrose Oil CAPS, Take 1 capsule by mouth daily.  , Disp: , Rfl:  .  ezetimibe (ZETIA) 10 MG tablet, TAKE 1 TABLET BY MOUTH EVERY DAY, Disp: 90 tablet, Rfl: 0 .  gabapentin (NEURONTIN) 300 MG capsule, TAKE 1 CAPSULE BY MOUTH EVERY NIGHT AT BEDTIME, Disp: 30 capsule, Rfl: 5 .  glucose blood (ACCU-CHEK GUIDE) test strip, 1 each by Other route 2 (two) times daily. Use as instructed, Disp: 100 each, Rfl: 2 .  hydrochlorothiazide (HYDRODIURIL) 12.5 MG tablet, Take 1 tablet (12.5 mg total) by mouth daily., Disp: 30 tablet, Rfl: 6 .  KLOR-CON M10 10 MEQ tablet, TAKE 1 TABLET BY MOUTH EVERY DAY, Disp: 30 tablet, Rfl: 5 .  LANCETS MICRO THIN 33G MISC, 1 each by Does not apply route 2 (two) times daily., Disp: 100 each, Rfl: 2 .  losartan (COZAAR) 100 MG  tablet, Take 1 tablet (100 mg total) by mouth daily., Disp: 30 tablet, Rfl: 6 .  Misc Natural Products (OSTEO BI-FLEX ADV TRIPLE ST PO), Take 2 capsules by mouth daily., Disp: , Rfl:  .  Multiple Vitamin (MULTIVITAMIN) tablet, Take 1 tablet by mouth daily., Disp: , Rfl:  .  Needles & Syringes MISC, 1 each by Does not apply route once. Pt uses freestyles lancets, Disp: 100 each, Rfl: 0 .  Omega-3 Fatty Acids (FISH OIL) 1000 MG CAPS, Take 1 capsule by mouth 2 (two) times daily., Disp: , Rfl:  .  solifenacin (VESICARE) 5 MG tablet, Take 5 mg by mouth daily. , Disp: , Rfl:  .  Specialty Vitamins Products ([facility] HAIR/SKIN/NAILS PO), Take 2 each by mouth daily., Disp: , Rfl:   Allergies  Allergen Reactions  . Ciprofloxacin Nausea And Vomiting  . Codeine Nausea And Vomiting  . Contrast Media [Iodinated Diagnostic Agents] Nausea And Vomiting  . Morphine And Related Nausea And Vomiting  . Statins Other (See Comments)    lethargic  . Toprol Xl [Metoprolol Tartrate]     Symptoms of heart attack, per pt, told not to take it again (?chest pain)  . Clindamycin/Lincomycin Itching and Rash  . Penicillins Hives, Swelling and Rash    Has patient had a PCN reaction causing immediate rash, facial/tongue/throat swelling, SOB or lightheadedness with hypotension: no Has patient had a PCN reaction causing severe rash ivolving mucus membranes or skin necrosis: no Has patient had a PCN reaction that required hospitalization no Has patient had a PCN reaction occurring within the last 10 years: no If all of the above answers are "NO", then may proceed with Cephalosporin use.  . Prednisone Palpitations   Review of Systems Objective:   Vitals:   10/12/17 1431  BP: 128/61  Pulse: (!) 59   General AA&O x3. Normal mood and affect.  Vascular Dorsalis pedis and posterior tibial pulses  barely palpable bilaterally  Capillary refill normal to all digits. Pedal hair growth absent Thin shiny skin to BLE.  Neurologic  Epicritic sensation grossly present. Diminished protective sensation L. Intact protective sensation R  Dermatologic No open lesions. Interspaces clear of maceration. Nails well groomed and normal in appearance.  Orthopedic: MMT 5/5 in dorsiflexion, plantarflexion, inversion, and eversion. Normal joint ROM without pain or crepitus.   Assessment & Plan:  Patient was evaluated and treated and all questions answered.  DM with PAD, on Anticoagulation. -Declined nail care.  Advised should she desire to take care of her nail she could  return in 3 months for nail care. -Recommended ABIs, patient states she had them done recently and they were ok. Would repeat in 6 months. -High risk of ulceration due to PAD and coagulation defect and thus offered patient diabetic shoes; patient declined. -Follow-up in 6 months for diabetic foot check, return in 3 months should she desire nail care.  Venous Insufficiency -Educated on etiology.  Return in about 6 months (around 04/11/2018).

## 2017-10-14 ENCOUNTER — Ambulatory Visit: Payer: Medicare Other | Admitting: Neurology

## 2017-10-18 NOTE — Progress Notes (Deleted)
   Patient presents to discuss recent bone density tests, which showed osteoporosis.  There was a significant decline from the last check in 2015, now with T-2.7 at Left femoral neck.  Previously showed mild osteopenia (but declining), T-1.4 at L femur, -1.1 at L forearm. Normal vitamin D in the past, last check 07/2015, 38. Normal thyroid check recently Lab Results  Component Value Date   TSH 0.87 08/17/2017

## 2017-10-19 ENCOUNTER — Ambulatory Visit: Payer: Self-pay | Admitting: Family Medicine

## 2017-11-23 ENCOUNTER — Other Ambulatory Visit: Payer: Self-pay | Admitting: Neurology

## 2017-12-01 LAB — HM PAP SMEAR: HM Pap smear: NEGATIVE

## 2017-12-06 NOTE — Progress Notes (Signed)
Chief Complaint  Patient presents with  . Follow-up    DEXA results.     Patient presents to discuss her bone density results.  DEXA was done in January, and showed significant worsening at Left hip, now osteoporosis.  T-2.7 at L fem neck (significant decline from 2015, was -1.4); T-1.3 L wrist (was -1.1). She is taking Citracal petites, 2/day (417m Ca, 500 IU of D).  She is also taking Vitamin D3 5000 IU once daily, but only started taking this 2 months ago.  Vitamin D-OH last checked in 07/2015, was normal at 38. At her last visit she reported that she was taking the Citracal twice daily, but is not doing this, only taking 2 pills (1 serving) once daily. She also takes multivitamins for diabetics, unsure of dose of any calcium or D.  She is supposed to be drinking almond milk with her shake for breakfast, but hasn't been. Eats 1 yogurt daily, no other dairy. Does eat some greens.  "I don't want to be on Fosamax", has friends with very bad results. When asked to clarify, she reports that their bones got worse. No complications. We discussed how it works, how it needs to be taken, and how if not taken correctly it make not work as well, as well as the need for Ca and D along with the medication. Regardless of these measures, she is not interested in this medication (or class).  She has questions about sugars and blood pressure, brings in her books where she records these. Sugars are all <100. BP's   107/54 lowest, usually  pulse 60's 167/88 this morning, and some other high values sporadically at home. No headaches, dizziness, chest pain.  She reports that she saw Dr. NNori Riis had abnormal pap, needed repeat which she had last week, waiting on results.  PMH, PSH, SH reviewed  Outpatient Encounter Medications as of 12/07/2017  Medication Sig Note  . Acetaminophen (TYLENOL ARTHRITIS PAIN PO) Take 2 tablets by mouth 2 (two) times daily.    .Marland KitchenamLODipine (NORVASC) 5 MG tablet TAKE 1 TABLET BY  MOUTH EVERY DAY   . Apoaequorin (PREVAGEN PO) Take 1 tablet by mouth daily.   . Biotin 1000 MCG tablet Take 1,000 mcg by mouth 3 (three) times daily.   . Blood Glucose Monitoring Suppl (ACCU-CHEK GUIDE) w/Device KIT 1 each by Does not apply route 2 (two) times daily.   . Calcium Carbonate-Vitamin D (CALTRATE 600+D PO) Take 2 tablets by mouth 2 (two) times daily.    . carvedilol (COREG) 25 MG tablet TAKE 1 & 1/2 TABLETS BY MOUTH TWICE A DAY   . cholecalciferol (VITAMIN D) 1000 units tablet Take 1,000 Units by mouth daily.   . clopidogrel (PLAVIX) 75 MG tablet TAKE 1 TABLET BY MOUTH EVERY DAY   . Coenzyme Q10 (CO Q 10 PO) Take 1 tablet by mouth daily.    . Evening Primrose Oil CAPS Take 1 capsule by mouth daily.     .Marland Kitchenezetimibe (ZETIA) 10 MG tablet TAKE 1 TABLET BY MOUTH EVERY DAY   . gabapentin (NEURONTIN) 300 MG capsule TAKE 1 CAPSULE BY MOUTH EVERY NIGHT AT BEDTIME   . glucose blood (ACCU-CHEK GUIDE) test strip 1 each by Other route 2 (two) times daily. Use as instructed   . hydrochlorothiazide (HYDRODIURIL) 12.5 MG tablet Take 1 tablet (12.5 mg total) by mouth daily.   .Marland KitchenKLOR-CON M10 10 MEQ tablet TAKE 1 TABLET BY MOUTH EVERY DAY   . LANCETS MICRO THIN  33G MISC 1 each by Does not apply route 2 (two) times daily.   Marland Kitchen losartan (COZAAR) 100 MG tablet Take 1 tablet (100 mg total) by mouth daily.   . Misc Natural Products (OSTEO BI-FLEX ADV TRIPLE ST PO) Take 2 capsules by mouth daily.   . Multiple Vitamin (MULTIVITAMIN) tablet Take 1 tablet by mouth daily. 04/18/2013: Takes a vitamin pack for diabetics  . Needles & Syringes MISC 1 each by Does not apply route once. Pt uses freestyles lancets   . Omega-3 Fatty Acids (FISH OIL) 1000 MG CAPS Take 1 capsule by mouth 2 (two) times daily. 12/07/2017: Reservatrol 2 daily-omega plus  . solifenacin (VESICARE) 5 MG tablet Take 5 mg by mouth daily.    Marland Kitchen Specialty Vitamins Products ([employer]S HAIR/SKIN/NAILS PO) Take 2 each by mouth daily.   . cephALEXin (KEFLEX)  500 MG capsule Take 1 tablet prior to dental procedure, take second tablet evening after dental procedure (Patient not taking: Reported on 12/07/2017)   . [DISCONTINUED] potassium chloride (K-DUR) 10 MEQ tablet TAKE 1 TABLET BY MOUTH EVERY DAY    No facility-administered encounter medications on file as of 12/07/2017.    Allergies  Allergen Reactions  . Ciprofloxacin Nausea And Vomiting  . Codeine Nausea And Vomiting  . Contrast Media [Iodinated Diagnostic Agents] Nausea And Vomiting  . Morphine And Related Nausea And Vomiting  . Statins Other (See Comments)    lethargic  . Toprol Xl [Metoprolol Tartrate]     Symptoms of heart attack, per pt, told not to take it again (?chest pain)  . Clindamycin/Lincomycin Itching and Rash  . Penicillins Hives, Swelling and Rash    Has patient had a PCN reaction causing immediate rash, facial/tongue/throat swelling, SOB or lightheadedness with hypotension: no Has patient had a PCN reaction causing severe rash ivolving mucus membranes or skin necrosis: no Has patient had a PCN reaction that required hospitalization no Has patient had a PCN reaction occurring within the last 10 years: no If all of the above answers are "NO", then may proceed with Cephalosporin use.  . Prednisone Palpitations   ROS: no fever, chills, URI symptoms, chest pain, headaches, dizziness.  She is still trying to lose weight.  Moods are good no other concerns. Denies dysphagia, jaw pain, sometimes has slight pain at right groin/hip joint.   PHYSICAL EXAM:  BP 124/72   Pulse 72   Ht '5\' 8"'$  (1.727 m)   Wt 189 lb (85.7 kg)   BMI 28.74 kg/m    Wt Readings from Last 3 Encounters:  12/07/17 189 lb (85.7 kg)  08/17/17 187 lb (84.8 kg)  05/11/17 202 lb 6.4 oz (91.8 kg)   Well appearing, pleasant, elderly female, in good spirits. Normal mood, affect, hygiene and grooming Neuro: alert and oriented, cranial nerves intact, normal gait  DEXA results as  above.   ASSESSMENT/PLAN:  Osteoporosis without current pathological fracture, unspecified osteoporosis type - counseled re: bisphosphonates, Prolia, Evista (h/o stroke, not good choice, even tho h/o Br CA). Counseled re: Ca (not getting enough), D, wt-bearing exercise - Plan: VITAMIN D 25 Hydroxy (Vit-D Deficiency, Fractures)  Medication monitoring encounter - Plan: VITAMIN D 25 Hydroxy (Vit-D Deficiency, Fractures)  Controlled type 2 diabetes mellitus with retinopathy, without long-term current use of insulin, macular edema presence unspecified, unspecified laterality, unspecified retinopathy severity ([place]) - remains diet controlled  Elevated blood pressure reading - at home, fine here. Encouraged her to bring monitor to verify accuracy at next visit.   Discussed bisphosphonates (  yearly infusion, weekly vs monthly oral) and Prolia injections q6 mos, Also discussed Evista given her h/o breast cancer. However, given h/o mini-stroke, likely should avoid.  Most willing to try Prolia--will have [name] check on [employer] coverage, cost. Would like to get records from Dr. Nori Riis. Abnl paps, s/p hysterectomy.   Had normal c-met in 08/2017  35 min visit, more than 1/2 spent counseling.   You are not getting enough Calcium in your diet or supplements.  Look at your other vitamins at home to see how much Calcium and D they contain. The citracal that you are taking is only 434m of calcium per 2 pills. You need to increase this to taking two pills twice daily, and also try and get more calcium daily in your diet (yogurt, almond milk, more greens, cheese).  We are going to look into the cost of Prolia injections (every 6 months at our office), and contact you with this information.  Try to get regular weight-bearing exercise, in addition to the calcium and vitamin D to help your bones stay strong. We are checking your vitamin D level today.  Consider bringing your blood pressure monitor with  you to your next visit so we can verify the accuracy. (you had a high reading at home this morning, but blood pressure was perfect here in the office).

## 2017-12-07 ENCOUNTER — Ambulatory Visit: Payer: Medicare Other | Admitting: Family Medicine

## 2017-12-07 ENCOUNTER — Encounter: Payer: Self-pay | Admitting: Family Medicine

## 2017-12-07 VITALS — BP 124/72 | HR 72 | Ht 68.0 in | Wt 189.0 lb

## 2017-12-07 DIAGNOSIS — R030 Elevated blood-pressure reading, without diagnosis of hypertension: Secondary | ICD-10-CM

## 2017-12-07 DIAGNOSIS — E11319 Type 2 diabetes mellitus with unspecified diabetic retinopathy without macular edema: Secondary | ICD-10-CM

## 2017-12-07 DIAGNOSIS — M810 Age-related osteoporosis without current pathological fracture: Secondary | ICD-10-CM

## 2017-12-07 DIAGNOSIS — Z5181 Encounter for therapeutic drug level monitoring: Secondary | ICD-10-CM | POA: Diagnosis not present

## 2017-12-07 NOTE — Patient Instructions (Addendum)
You are not getting enough Calcium in your diet or supplements.  Look at your other vitamins at home to see how much Calcium and D they contain. The citracal that you are taking is only 400mg  of calcium per 2 pills. You need to increase this to taking two pills twice daily, and also try and get more calcium daily in your diet (yogurt, almond milk, more greens, cheese).  We are going to look into the cost of Prolia injections (every 6 months at our office), and contact you with this information.  Try to get regular weight-bearing exercise, in addition to the calcium and vitamin D to help your bones stay strong. We are checking your vitamin D level today.  Consider bringing your blood pressure monitor with you to your next visit so we can verify the accuracy. (you had a high reading at home this morning, but blood pressure was perfect here in the office).    Osteoporosis Osteoporosis is the thinning and loss of density in the bones. Osteoporosis makes the bones more brittle, fragile, and likely to break (fracture). Over time, osteoporosis can cause the bones to become so weak that they fracture after a simple fall. The bones most likely to fracture are the bones in the hip, wrist, and spine. What are the causes? The exact cause is not known. What increases the risk? Anyone can develop osteoporosis. You may be at greater risk if you have a family history of the condition or have poor nutrition. You may also have a higher risk if you are:  Female.  82 years old or older.  A smoker.  Not physically active.  White or Asian.  Slender.  What are the signs or symptoms? A fracture might be the first sign of the disease, especially if it results from a fall or injury that would not usually cause a bone to break. Other signs and symptoms include:  Low back and neck pain.  Stooped posture.  Height loss.  How is this diagnosed? To make a diagnosis, your health care provider  may:  Take a medical history.  Perform a physical exam.  Order tests, such as: ? A bone mineral density test. ? A dual-energy X-ray absorptiometry test.  How is this treated? The goal of osteoporosis treatment is to strengthen your bones to reduce your risk of a fracture. Treatment may involve:  Making lifestyle changes, such as: ? Eating a diet rich in calcium. ? Doing weight-bearing and muscle-strengthening exercises. ? Stopping tobacco use. ? Limiting alcohol intake.  Taking medicine to slow the process of bone loss or to increase bone density.  Monitoring your levels of calcium and vitamin D.  Follow these instructions at home:  Include calcium and vitamin D in your diet. Calcium is important for bone health, and vitamin D helps the body absorb calcium.  Perform weight-bearing and muscle-strengthening exercises as directed by your health care provider.  Do not use any tobacco products, including cigarettes, chewing tobacco, and electronic cigarettes. If you need help quitting, ask your health care provider.  Limit your alcohol intake.  Take medicines only as directed by your health care provider.  Keep all follow-up visits as directed by your health care provider. This is important.  Take precautions at home to lower your risk of falling, such as: ? Keeping rooms well lit and clutter free. ? Installing safety rails on stairs. ? Using rubber mats in the bathroom and other areas that are often wet or slippery. Get help  right away if: You fall or injure yourself. This information is not intended to replace advice given to you by your health care provider. Make sure you discuss any questions you have with your health care provider. Document Released: 06/11/2005 Document Revised: 02/04/2016 Document Reviewed: 02/09/2014 Elsevier Interactive Patient Education  [employer].

## 2017-12-08 LAB — VITAMIN D 25 HYDROXY (VIT D DEFICIENCY, FRACTURES): Vit D, 25-Hydroxy: 45.4 ng/mL (ref 30.0–100.0)

## 2017-12-14 ENCOUNTER — Telehealth: Payer: Self-pay | Admitting: Family Medicine

## 2017-12-14 NOTE — Telephone Encounter (Signed)
Prolia was verified and pt's insurance did approve with a estimated out of pocket cost of $70.  Pt was advised and she does want to proceed. Pt was placed on nurse schedule for 12/22/2017. Prolia was ordered from [place].

## 2017-12-18 ENCOUNTER — Other Ambulatory Visit: Payer: Self-pay | Admitting: Family Medicine

## 2017-12-18 DIAGNOSIS — I10 Essential (primary) hypertension: Secondary | ICD-10-CM

## 2017-12-22 ENCOUNTER — Other Ambulatory Visit (INDEPENDENT_AMBULATORY_CARE_PROVIDER_SITE_OTHER): Payer: Medicare Other

## 2017-12-22 DIAGNOSIS — M810 Age-related osteoporosis without current pathological fracture: Secondary | ICD-10-CM

## 2017-12-22 MED ORDER — DENOSUMAB 60 MG/ML ~~LOC~~ SOLN
60.0000 mg | Freq: Once | SUBCUTANEOUS | Status: AC
  Administered 2017-12-22: 60 mg via SUBCUTANEOUS

## 2017-12-24 ENCOUNTER — Telehealth: Payer: Self-pay | Admitting: Family Medicine

## 2017-12-24 NOTE — Telephone Encounter (Signed)
Received requested info from Dr. Nori Riis. Sending back for review

## 2017-12-25 NOTE — Progress Notes (Signed)
All insurance verified and pt informed. Pt received injection.

## 2017-12-28 ENCOUNTER — Encounter: Payer: Self-pay | Admitting: *Deleted

## 2018-01-15 ENCOUNTER — Other Ambulatory Visit: Payer: Self-pay | Admitting: Family Medicine

## 2018-01-15 DIAGNOSIS — E7800 Pure hypercholesterolemia, unspecified: Secondary | ICD-10-CM

## 2018-02-17 ENCOUNTER — Other Ambulatory Visit: Payer: Self-pay | Admitting: Family Medicine

## 2018-02-17 DIAGNOSIS — I10 Essential (primary) hypertension: Secondary | ICD-10-CM

## 2018-02-25 ENCOUNTER — Other Ambulatory Visit: Payer: Self-pay | Admitting: Family Medicine

## 2018-02-25 DIAGNOSIS — I10 Essential (primary) hypertension: Secondary | ICD-10-CM

## 2018-02-25 NOTE — Telephone Encounter (Signed)
She has appt 7/22

## 2018-02-25 NOTE — Telephone Encounter (Signed)
Is this ok to refill?  

## 2018-03-07 ENCOUNTER — Other Ambulatory Visit: Payer: Self-pay | Admitting: Family Medicine

## 2018-03-07 DIAGNOSIS — I10 Essential (primary) hypertension: Secondary | ICD-10-CM

## 2018-03-13 ENCOUNTER — Other Ambulatory Visit: Payer: Self-pay | Admitting: Family Medicine

## 2018-03-13 DIAGNOSIS — I10 Essential (primary) hypertension: Secondary | ICD-10-CM

## 2018-03-16 ENCOUNTER — Other Ambulatory Visit: Payer: Self-pay | Admitting: Family Medicine

## 2018-03-16 DIAGNOSIS — I10 Essential (primary) hypertension: Secondary | ICD-10-CM

## 2018-03-19 ENCOUNTER — Ambulatory Visit: Payer: Medicare Other | Admitting: Neurology

## 2018-03-20 ENCOUNTER — Other Ambulatory Visit: Payer: Self-pay | Admitting: Family Medicine

## 2018-03-20 DIAGNOSIS — I10 Essential (primary) hypertension: Secondary | ICD-10-CM

## 2018-03-23 ENCOUNTER — Other Ambulatory Visit: Payer: Self-pay | Admitting: Family Medicine

## 2018-03-23 DIAGNOSIS — I10 Essential (primary) hypertension: Secondary | ICD-10-CM

## 2018-04-01 ENCOUNTER — Other Ambulatory Visit: Payer: Self-pay | Admitting: Family Medicine

## 2018-04-01 DIAGNOSIS — I10 Essential (primary) hypertension: Secondary | ICD-10-CM

## 2018-04-04 NOTE — Progress Notes (Signed)
Chief Complaint  Patient presents with  . Medicare Wellness    fasting AWV/CPE no pap-sees Dr.Neal. Does mention that she has had some urinary burning. No eye exam, saw Dr.Rankin. 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 has retired-not sure sure what to do now.     Kristin Willis is a 84 y.o. female who presents for annual physical exam, Medicare wellness visit and follow-up on chronic medical conditions.    Osteoporosis:  DEXA was done in 09/2017, and showed significant worsening at Left hip, now osteoporosis.  T-2.7 at L fem neck (significant decline from 2015, was -1.4); T-1.3 L wrist (was -1.1).  She had her first Prolia injection in April. She tolerated this without side effects. She had been taking Citracal petites, 2/day (441m Ca, 500 IU of D)--and, as directed, since last visit increased to BID dosing.  She had also been taking Vitamin D3 5000 IU once daily x 2 months prior to last visit--she was asked to decrease the dose--and has changed it to 1000 IU daily since then. She continues to take MVI for diabetics. Vitamin D level was 45.4 in April (after taking 5000 IU daily x 2 mos).  Hypertension:  She reports compliance with taking amlodipine, carvedilol, HCTZ (and the potassium (klorcon)), and losartan. The losartan was cut back to once daily after her blood pressures had been low at last visit (at last med check she reports BP's of  87-110/42-62. This was using her son-in-law's monitor, and was verified as accurate in December 2018.); since then she inadvertantly went back up to BID dosing, as that is what the bottle said (had refills).  Review of recent BP's--some low (99/46, 114/55, 117/60), lower in May-June when "dieting a lot".  More recently have been 120's-130's/60. Denies any lightheadedness, dizziness.     Diabetes: Controlled by diet, she has never taken medications. Checks sugars about once a week, and are running 90-126 before eating, 116-142  after eating (usually <140). She had been on the Next 56 Day diet, lost 20#. She has regained some (6#) since last visit, reports she is not able to follow it entirely since her last visit, since family moved in with her, and is no longer going to the meetings; continues to avoid sugars and carbs.   Last diabetic eye exam was 09/2017 (goes yearly--report not received). Denies polydipsia (some dry mouth from meds, no changes), polyuria (drinks a lot, no changes). She has tingling in her left foot since her minor stroke, unchanged.  Left breast cancer: She "graduated" from going to the oncologist, after going for 7 years. Last visit with Dr. MJana Hakimwas in September 2015. She continues to [facility], required spot compression, which was normal), and see Dr. NNori Riisyearly for her breast (and pelvic) exams. She has prolapse of her bladder, and she tried pessaries, but they fall out, and Dr. NNori Riisdidn't want to operate. She gets occasional UTI's which she relates to the prolapse (none recently). She has some occasional burning with urination, no other symptoms. Burning has been intermittently for the last 2 weeks.  OAB--seen atDr. McDiarmid's officemonthly for transcutaneous nerve stimulationtreatments (sees him just 1-2x/year).  She wears pads all the time, and special underwear at night, getting up 3x/night. Overall doing much better on the treatments and the Vesicare and Myrbetriq.  Hyperlipidemia-- Sheis taking a fish oil capsule that she found that does not have red dye, 10047m taking 1 daily.  Trying to follow a low fat diet. She limits her carbs, sweets, fried foods. Snacks on cheese. Compliant with Zetia. Lab Results  Component Value Date   CHOL 141 08/17/2017   HDL 42 (L) 08/17/2017   LDLCALC 77 08/17/2017   TRIG 135 08/17/2017   CHOLHDL 3.4 08/17/2017   Memory concerns: She has been taking Prevagen for at least 3years to help with memory, and she  finds it effective. She notices improvement--easier to recall names. She is very busy, but keeps up with everything by keeping an organized, detailed calendar.Some days are better than others. Denies any memory concerns--doesn't lose keys, car, get lost driving.  Just needs to write things down more.  H/o CVA, on Plavix. She has residual numbness of L foot. Also felt to have a mild peripheral neuropathy, possibly related to her diabetes (diet controlled).  She remains on gabapentin (started by Dr. Jana Hakim in the past). She denies any pain in her foot, just a mild tingling/numbness. She remains onPlavix. Denies any bleeding or side effects(some bruising on hands, forearms). She sees Dr. Jannifer Franklin once yearly, in January (he canceled and is rescheduled).  Gout: She hasn't had a flare in many years. She previously took allopurinol, but it was stopped over ayear ago. No flares since stopping it. Denies any pain/flares. Last check of uric acid was after stopping allopurinol.  Hasn't had any problems since Lab Results  Component Value Date   LABURIC 4.8 02/16/2017   Membranous nephropathy--Previously saw [name] yearly. She continues to be in remission per her last report.  Doing well. [name] will be retiring, and asked PCP to do yearly u/a for proteinuria.  Patient states today that she was called and told she was having a relapse.  Note from 12/2017 visit was reviewed in detail with patient--no mention of relapse.  She visited son recently (alcoholic), he is doing well. Does daily meditations--doesn't want to go back to [employer] meetings, too busy to be sponsor.  Things are good right now.   Immunization History  Administered Date(s) Administered  . Influenza Split 06/14/2012, 06/09/2013, 05/30/2014  . Influenza Whole 07/06/2002, 06/28/2008, 06/25/2011  . Influenza, High Dose Seasonal PF 05/11/2017  . Influenza-Unspecified 05/31/2015, 05/27/2016  . Pneumococcal Conjugate-13  01/09/2014  . Pneumococcal Polysaccharide-23 04/05/2007, 02/16/2017  . Tdap 04/05/2007, 01/02/2017  . Zoster 09/15/2004   Last Pap smear:    (h/o endometrial CA)--sees Dr. Nori Riis yearly in July Last mammogram: 11/2017 Last colonoscopy: 01/2014--tubular adenoma.  F/u not recommended due to age, per Dr. Cristina Gong Last DEXA: 09/2017 T-2.7 at L fem neck (significant decline from 2015) Dentist: rarely sees Dr. Rolland Porter, but sees Dr. Essie Hart and reports they do all of her cleanings, x-rays, etc. Ophtho: Dr. Zadie Rhine yearly Exercise: water aerobics for 1 hour 2x/week. Now walks in the smaller grocery stores rather than riding. Vit D-OH 38 in 07/2015, 45.4 in 12/2017.  Other doctors caring for patient include: GYN:  Dr. Nori Riis Nephro: [name] (retired) Neuro: Dr. Jannifer Franklin Urology: Dr. McDiarmid Ortho: Dr. Tamera Punt ([facility]) Ophtho: Dr. Zadie Rhine Dentist: Dr. Jone Baseman (just twice), and sees Dr. Essie Hart (periodontist) GI: Dr. Cristina Gong Derm: [name] (retired--going to try and change to someone in [place]) Wound doctor:  Dr. Dellia Nims (healed, no longer sees) Podiatry: Dr. March Rummage  Depression screen:  Negative Falls screen: one--fell at [facility] store, lost balance stepping on curb. Hit head, coccyx and wrist.  Went to UC in [place]. No fractures, no concussion.  Functional status survey: notable for leakage of urine.  Got hearing aides recently, so hearing is improved. Memory is stable. Doesn't like stairs--afraid of falling Mini-Cog screen: normal (5)  See Epic for full questionnaires  End of Life Discussion:  Patient has a living will and medical power of attorney.  Past Medical History:  Diagnosis Date  . Abnormality of gait 07/21/2016  . Arthritis    right knee, left wrist  . Asthma    related to allergies when working; resolved  . Breast cancer ([place]) 7/08 Inrasine ductal L breast  . Colon polyps    4/09--adenomatous. 04/2010--normal.  Repeat 2014  . DDD (degenerative disc disease), lumbar  4/06  . Diabetes mellitus    diet controlled  . Diabetic retinopathy   . DJD (degenerative joint disease)   . Dyslipidemia   . Endometrial cancer (H[facility]) hx  . Gout   . Hepatitis A history(in [place]-1950's)  . History of stroke 10/24/2013  . Hypertension   . Incontinence   . Membranous glomerulonephropathy    [name]  . Mini stroke [facility]) DrWillis  . Obesity, unspecified   . Osteopenia L hip, 4/06  . 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 Skin cancer    [name]  . Sliding hiatal hernia small sliding hiatal hernia  . Wears glasses     Past Surgical History:  Procedure Laterality Date  . ABDOMINAL HYSTERECTOMY     endometrial cancer  . APPENDECTOMY    . BREAST LUMPECTOMY Left 8/08 L Breast ([name])  . CATARACT EXTRACTION    . COLONOSCOPY  8/08 [name] (normal)  . kidney stones    . left middle finger    . REPLACEMENT TOTAL KNEE  Left  . SKIN CANCER EXCISION    . TOTAL KNEE ARTHROPLASTY     left    Social History   Socioeconomic History  . Marital status: Married    Spouse name: Not on file  . Number of children: 3  . Years of education: 59  . Highest education level: Not on file  Occupational History  . Occupation: retired  Scientific laboratory technician  . Financial resource strain: Not on file  . Food insecurity:    Worry: Not on file    Inability: Not on file  . Transportation needs:    Medical: Not on file    Non-medical: Not on file  Tobacco Use  . Smoking status: Never Smoker  . Smokeless tobacco: Never Used  Substance and Sexual Activity  . Alcohol use: No  . Drug use: No  . Sexual activity: Never  Lifestyle  . Physical activity:    Days per week: Not on file    Minutes per session: Not on file  . Stress: Not on file  Relationships  . Social connections:    Talks on phone: Not on file    Gets together: Not on file    Attends religious service: Not on file    Active member of club or organization: Not on file    Attends meetings of  clubs or organizations: Not on file    Relationship status: Not on file  . Intimate partner violence:    Fear of current or ex partner: Not on file    Emotionally abused: Not on file    Physically abused: Not on file    Forced sexual activity: Not on file  Other Topics Concern  . Not on file  Social History Narrative   1 daughter lives in [place], son in [place], [place].  Widowed (husband  was in [place])   Son is an alcoholic (in rehab 02/2946), doing well (2019)   Her daughter who lives in [place] (and her whole family) moved in with her recently Electronics engineer, building a new house) 2019.      Patient drinks 2 cups of tea daily, not daily   Patient is right handed.     Family History  Problem Relation Age of Onset  . Heart attack Father   . Diabetes Father   . Heart attack Mother   . Hypertension Mother   . Diabetes Mother   . Hypertension Brother   . Stroke Brother   . Melanoma Brother   . Diabetes Brother   . Hypertension Son   . Alcohol abuse Son   . Stroke Paternal Grandmother   . Diabetes Paternal Grandfather   . Vision loss Paternal Grandfather        related to diabetes  . Cancer Neg Hx     Outpatient Encounter Medications as of 04/05/2018  Medication Sig Note  . Acetaminophen (TYLENOL ARTHRITIS PAIN PO) Take 2 tablets by mouth 2 (two) times daily.    Marland Kitchen amLODipine (NORVASC) 5 MG tablet TAKE 1 TABLET BY MOUTH EVERY DAY   . Apoaequorin (PREVAGEN PO) Take 1 tablet by mouth daily.   . Biotin 1000 MCG tablet Take 1,000 mcg by mouth 3 (three) times daily.   . Blood Glucose Monitoring Suppl (ACCU-CHEK GUIDE) w/Device KIT 1 each by Does not apply route 2 (two) times daily.   . Calcium Carbonate-Vitamin D (CALTRATE 600+D PO) Take 2 tablets by mouth 2 (two) times daily.    . carvedilol (COREG) 25 MG tablet TAKE 1 & 1/2 TABLETS BY MOUTH TWICE A DAY   . cholecalciferol (VITAMIN D) 1000 units tablet Take 1,000 Units by mouth daily.   . clopidogrel (PLAVIX) 75 MG  tablet TAKE 1 TABLET BY MOUTH EVERY DAY   . Coenzyme Q10 (CO Q 10 PO) Take 1 tablet by mouth daily.    Marland Kitchen ezetimibe (ZETIA) 10 MG tablet TAKE 1 TABLET BY MOUTH EVERY DAY   . gabapentin (NEURONTIN) 300 MG capsule TAKE 1 CAPSULE BY MOUTH EVERY NIGHT AT BEDTIME   . glucose blood (ACCU-CHEK GUIDE) test strip 1 each by Other route 2 (two) times daily. Use as instructed   . hydrochlorothiazide (HYDRODIURIL) 12.5 MG tablet TAKE 1 TABLET BY MOUTH EVERY DAY   . KLOR-CON M10 10 MEQ tablet TAKE 1 TABLET BY MOUTH EVERY DAY   . LANCETS MICRO THIN 33G MISC 1 each by Does not apply route 2 (two) times daily.   Marland Kitchen losartan (COZAAR) 100 MG tablet Take 1 tablet (100 mg total) by mouth daily. 04/05/2018: States she is still taking it twice daily--bottle still says to  . Misc Natural Products (OSTEO BI-FLEX ADV TRIPLE ST PO) Take 2 capsules by mouth daily.   . Multiple Vitamin (MULTIVITAMIN) tablet Take 1 tablet by mouth daily. 04/18/2013: Takes a vitamin pack for diabetics  . MYRBETRIQ 50 MG TB24 tablet Take 50 mg by mouth daily.   . Needles & Syringes MISC 1 each by Does not apply route once. Pt uses freestyles lancets   . Omega-3 Fatty Acids (FISH OIL) 1000 MG CAPS Take 1 capsule by mouth 2 (two) times daily. 12/07/2017: Reservatrol 2 daily-omega plus  . solifenacin (VESICARE) 5 MG tablet Take 5 mg by mouth daily.    Marland Kitchen Specialty Vitamins Products ([employer] HAIR/SKIN/NAILS PO) Take 2 each by mouth daily.   . cephALEXin (  KEFLEX) 500 MG capsule Take 1 tablet prior to dental procedure, take second tablet evening after dental procedure (Patient not taking: Reported on 12/07/2017)   . [DISCONTINUED] Evening Primrose Oil CAPS Take 1 capsule by mouth daily.     . [DISCONTINUED] losartan (COZAAR) 100 MG tablet TAKE 1 TABLET BY MOUTH TWICE A DAY   . [DISCONTINUED] potassium chloride (K-DUR) 10 MEQ tablet TAKE 1 TABLET BY MOUTH EVERY DAY    No facility-administered encounter medications on file as of 04/05/2018.     Allergies   Allergen Reactions  . Ciprofloxacin Nausea And Vomiting  . Codeine Nausea And Vomiting  . Contrast Media [Iodinated Diagnostic Agents] Nausea And Vomiting  . Morphine And Related Nausea And Vomiting  . Statins Other (See Comments)    lethargic  . Toprol Xl [Metoprolol Tartrate]     Symptoms of heart attack, per pt, told not to take it again (?chest pain)  . Clindamycin/Lincomycin Itching and Rash  . Penicillins Hives, Swelling and Rash    Has patient had a PCN reaction causing immediate rash, facial/tongue/throat swelling, SOB or lightheadedness with hypotension: no Has patient had a PCN reaction causing severe rash ivolving mucus membranes or skin necrosis: no Has patient had a PCN reaction that required hospitalization no Has patient had a PCN reaction occurring within the last 10 years: no If all of the above answers are "NO", then may proceed with Cephalosporin use.  . Prednisone Palpitations    ROS:  The patient denies anorexia, fever, headaches,  vision changes, ear pain, sore throat, breast concerns, chest pain, palpitations, dizziness, syncope, dyspnea on exertion, cough, swelling, nausea, vomiting, diarrhea, constipation, abdominal pain, melena, hematochezia, indigestion/heartburn, hematuria, dysuria, vaginal bleeding, discharge, odor or itch, genital lesions, joint pains, numbness (just residual from stroke), tingling, weakness, tremor, suspicious skin lesions, depression, anxiety, abnormal bleeding/bruising (just bruising of hands/forearms), or enlarged lymph nodes. Pain--some pain in right shoulder, hands.  Problems with right hip/groin pain intermittently, sometimes when she stands. Topical creams help--on her fingers and groin. (blue emu cream) Incontinence (wears pads, overall controlled with medication and nerve treatments). Some burning with urination recently, intermittent. Weight gain--not able to maintain the same diet since family moved in with her.. Diarrhea only after  eatings that aren't fresh. Slight burning with urination, no urgency or abdominal pain. Got hearing aids   PHYSICAL EXAM:  BP 124/74   Pulse 60   Temp (!) 97.3 F (36.3 C) (Tympanic)   Ht _0  (1.676 m)   Wt 195 lb 9.6 oz (88.7 kg)   BMI 31.57 kg/m   Wt Readings from Last 3 Encounters:  04/05/18 195 lb 9.6 oz (88.7 kg)  12/07/17 189 lb (85.7 kg)  08/17/17 187 lb (84.8 kg)    General Appearance:    Alert, cooperative, no distress, appears younger than stated age  Head:    Normocephalic, without obvious abnormality, atraumatic  Eyes:    PERRL, conjunctiva/corneas clear, EOM's intact, fundi benign  Ears:    Normal TM's and external ear canals  Nose:   Nares normal, mucosa normal, no drainage or sinus tenderness  Throat:   Lips, mucosa, and tongue normal; teeth and gums normal  Neck:   Supple, no lymphadenopathy;  thyroid:  no enlargement/ tenderness/nodules; no carotid bruit or JVD  Back:    Spine nontender, no curvature, ROM normal, no CVA  tenderness  Lungs:     Clear to auscultation bilaterally without wheezes, rales or  ronchi; respirations unlabored  Chest Wall:  No tenderness or deformity   Heart:    Regular rate and rhythm, S1 and S2 normal, no murmur, rub   or gallop  Breast Exam:    Deferred to GYN  Abdomen:     Soft, non-tender, nondistended, normoactive bowel sounds,    no masses, no hepatosplenomegaly  Genitalia:    Deferred to GYN     Extremities:   No clubbing, cyanosis. Swelling trace to 1+ at right lower leg; some hyperpigmentation and mild erythema anteriorly at right shin. Skin is intact, completely healed. Significantly decreased external rotation of right hip, painful. Also unable to completely extend her RLE due to hip pain.  Pulses:   2+ and symmetric all extremities  Skin:   Skin turgor normal, no rashes or lesions. Purpuric lesions and bruising noted on hands and forearms. Right forearm has a mild, healing skin tear from recent trauma. Dry skin Skin  changes to R lower shin as per extremity exam above, now completely healed.  Lymph nodes:   Cervical, supraclavicular nodes normal  Neurologic:   CNII-XII intact, normal strength, sensation and gait; reflexes 2+ and symmetric throughout                                Psych:   Normal mood, affect, hygiene and grooming  Normal diabetic foot exam  Lab Results  Component Value Date   HGBA1C 5.7 (A) 04/05/2018   Urine dip: negative for protein.  Nitrite+ and mod (2+) leuks. Specimen was not clean catch. She did use the wipes, but used hat to catch urine, not midstream.   ASSESSMENT/PLAN:  Annual physical exam - Plan: POCT Urinalysis DIP (Proadvantage Device)  Encounter for Medicare annual wellness exam  Controlled type 2 diabetes mellitus with retinopathy, without long-term current use of insulin, macular edema presence unspecified, unspecified laterality, unspecified retinopathy severity ([place]) - diet controlled; encouraged to resume her diet that was working for weight loss - Plan: HgB A1c  Essential hypertension, benign - controlled; I'd prefer her to be on losartan 16m daily, not BID; BP's much lower when better with diet. Change to qd dosing, monitor BP's - Plan: Comprehensive metabolic panel  Osteoporosis without current pathological fracture, unspecified osteoporosis type - continue current Ca, D supplements, and Prolia injections q6 months; educated re: need for lifelong treatments  Mixed hyperlipidemia  Senile purpura ([place]  DM (diabetes mellitus), type 2 with peripheral vascular complications ([place] - Plan: Comprehensive metabolic panel  Medication monitoring encounter - Plan: Comprehensive metabolic panel  Dysuria - intermittent; h/o UTI's. culture sent (wasn't clean catch) - Plan: Urine Culture  Abnormal urinalysis - Plan: Urine Culture  Right hip pain - significant decr ROM, suspect significant OA. Check x-ray. She doesn't want surgery, discussed other possible treatment  options including steroid injection - Plan: DG HIP UNILAT W OR W/O PELVIS 2-3 VIEWS RIGHT  History of membranous glomerulonephritis - pt recalls being told of relapse; no e/o per notes. reassured no proteinuria on u/a today   A1c, c-met (nonfasting) Due for urine microalb/Cr, but with poss UTI and urine abnl, will defer testing to a later date. Urine dip (screen for proteinuria--no longer seeing nephrologist).  Call for diabetic eye exam results from Dr. RZadie Rhineearlier this year.  Xray right hip  Discussed monthly self breast exams and yearly mammograms; at least 30 minutes of aerobic activity at least 5 days/week and weight-bearing exercise 2x/week; proper sunscreen use reviewed; healthy diet, including  goals of calcium and vitamin D intake and alcohol recommendations (less than or equal to 1 drink/day) reviewed; regular seatbelt use; changing batteries in smoke detectors.  Immunization recommendations discussed--continue yeary flu shots.  shingrix recommended (to get at pharmacy). Colonoscopy recommendations reviewed, due again 2020, but likely will not have, due to age, unless problems. DEXA follow-up due 09/2019  Full code, full care   We had cut your losartan dose back to only 1 pill daily (rather than twice daily) at your last visit due to some very low blood pressures. Your blood pressure was lower in May/June, a little higher more recently--possibly related to changes in diet. If you can cut back on the sodium in the diet, keep the weight going down again (you regained some), then you likely can do well with the once daily losartan rather than needing it twice daily.  Go back to once daily losartan, monitor blood pressure, and with more careful diet, hopefully the numbers will stay in the low 130's/80 or lower. If they are consistently over 140/90 please let us know.  You likely have significant arthritis in your right hip--you have significantly limited movement in that hip.  Go to  [facility]  ([address]) for an x-ray.  We will likely have you follow up with your orthopedist to discuss options.  There are nonsurgical options (including cortisone injection).    Medicare Attestation I have personally reviewed: The patient's medical and social history Their use of alcohol, tobacco or illicit drugs Their current medications and supplements The patient's functional ability including ADLs,fall risks, home safety risks, cognitive, and hearing and visual impairment Diet and physical activities Evidence for depression or mood disorders  The patient's weight, height and BMI have been recorded in the chart.  I have made referrals, counseling, and provided education to the patient based on review of the above and I have provided the patient with a written personalized care plan for preventive services.

## 2018-04-05 ENCOUNTER — Ambulatory Visit
Admission: RE | Admit: 2018-04-05 | Discharge: 2018-04-05 | Disposition: A | Discharge status: Home / Self Care | Payer: Medicare Other | Source: Ambulatory Visit | Attending: Family Medicine | Admitting: Family Medicine

## 2018-04-05 ENCOUNTER — Encounter: Payer: Self-pay | Admitting: Family Medicine

## 2018-04-05 ENCOUNTER — Ambulatory Visit: Payer: Medicare Other | Admitting: Family Medicine

## 2018-04-05 VITALS — BP 124/74 | HR 60 | Temp 97.3°F | Ht 66.0 in | Wt 195.6 lb

## 2018-04-05 DIAGNOSIS — E11319 Type 2 diabetes mellitus with unspecified diabetic retinopathy without macular edema: Secondary | ICD-10-CM

## 2018-04-05 DIAGNOSIS — E1151 Type 2 diabetes mellitus with diabetic peripheral angiopathy without gangrene: Secondary | ICD-10-CM

## 2018-04-05 DIAGNOSIS — Z5181 Encounter for therapeutic drug level monitoring: Secondary | ICD-10-CM

## 2018-04-05 DIAGNOSIS — Z87448 Personal history of other diseases of urinary system: Secondary | ICD-10-CM

## 2018-04-05 DIAGNOSIS — I10 Essential (primary) hypertension: Secondary | ICD-10-CM | POA: Diagnosis not present

## 2018-04-05 DIAGNOSIS — R300 Dysuria: Secondary | ICD-10-CM

## 2018-04-05 DIAGNOSIS — M25551 Pain in right hip: Secondary | ICD-10-CM

## 2018-04-05 DIAGNOSIS — Z0000 Encounter for general adult medical examination without abnormal findings: Secondary | ICD-10-CM

## 2018-04-05 DIAGNOSIS — D692 Other nonthrombocytopenic purpura: Secondary | ICD-10-CM

## 2018-04-05 DIAGNOSIS — M810 Age-related osteoporosis without current pathological fracture: Secondary | ICD-10-CM | POA: Diagnosis not present

## 2018-04-05 DIAGNOSIS — E782 Mixed hyperlipidemia: Secondary | ICD-10-CM | POA: Diagnosis not present

## 2018-04-05 DIAGNOSIS — R8290 Unspecified abnormal findings in urine: Secondary | ICD-10-CM

## 2018-04-05 LAB — POCT URINALYSIS DIP (PROADVANTAGE DEVICE)
BILIRUBIN UA: NEGATIVE mg/dL
Bilirubin, UA: NEGATIVE
GLUCOSE UA: NEGATIVE mg/dL
Nitrite, UA: POSITIVE — AB
Protein Ur, POC: NEGATIVE mg/dL
RBC UA: NEGATIVE
SPECIFIC GRAVITY, URINE: 1.015
Urobilinogen, Ur: NEGATIVE
pH, UA: 6 (ref 5.0–8.0)

## 2018-04-05 LAB — POCT GLYCOSYLATED HEMOGLOBIN (HGB A1C): Hemoglobin A1C: 5.7 % — AB (ref 4.0–5.6)

## 2018-04-05 NOTE — Patient Instructions (Addendum)
HEALTH MAINTENANCE RECOMMENDATIONS:  It is recommended that you get at least 30 minutes of aerobic exercise at least 5 days/week (for weight loss, you may need as much as 60-90 minutes). This can be any activity that gets your heart rate up. This can be divided in 10-15 minute intervals if needed, but try and build up your endurance at least once a week.  Weight bearing exercise is also recommended twice weekly.  Eat a healthy diet with lots of vegetables, fruits and fiber.  "Colorful" foods have a lot of vitamins (ie green vegetables, tomatoes, red peppers, etc).  Limit sweet tea, regular sodas and alcoholic beverages, all of which has a lot of calories and sugar.  Up to 1 alcoholic drink daily may be beneficial for women (unless trying to lose weight, watch sugars).  Drink a lot of water.  Calcium recommendations are 1200-1500 mg daily (1500 mg for postmenopausal women or women without ovaries), and vitamin D 1000 IU daily.  This should be obtained from diet and/or supplements (vitamins), and calcium should not be taken all at once, but in divided doses.  Monthly self breast exams and yearly mammograms for women over the age of 19 is recommended.  Sunscreen of at least SPF 30 should be used on all sun-exposed parts of the skin when outside between the hours of 10 am and 4 pm (not just when at beach or pool, but even with exercise, golf, tennis, and yard work!)  Use a sunscreen that says "broad spectrum" so it covers both UVA and UVB rays, and make sure to reapply every 1-2 hours.  Remember to change the batteries in your smoke detectors when changing your clock times in the spring and fall.  Use your seat belt every time you are in a car, and please drive safely and not be distracted with cell phones and texting while driving.   Kristin Willis , Thank you for taking time to come for your Medicare Wellness Visit. I appreciate your ongoing commitment to your health goals. Please review the following  plan we discussed and let me know if I can assist you in the future.   These are the goals we discussed: Goals    None      This is a list of the screening recommended for you and due dates:  Health Maintenance  Topic Date Due  . Complete foot exam   08/18/2017  . Eye exam for diabetics  10/10/2017  . Hemoglobin A1C  02/15/2018  . Flu Shot  04/15/2018  . Colon Cancer Screening  02/02/2019  . Tetanus Vaccine  01/03/2027  . DEXA scan (bone density measurement)  Completed  . Pneumonia vaccines  Completed   I recommend getting the new shingles vaccine (Shingrix). You will need to check with your insurance to see if it is covered, and if covered by Medicare Part D, you need to get from the pharmacy rather than our office.  It is a series of 2 injections, spaced 2 months apart.  We will get the eye exam from your doctor (ignore date above). Foot exam was done today.  Your next bone density test will be due 09/2019.   We had cut your losartan dose back to only 1 pill daily (rather than twice daily) at your last visit due to some very low blood pressures. Your blood pressure was lower in May/June, a little higher more recently--possibly related to changes in diet. If you can cut back on the sodium in the  diet, keep the weight going down again (you regained some), then you likely can do well with the once daily losartan rather than needing it twice daily.  Go back to once daily losartan, monitor blood pressure, and with more careful diet, hopefully the numbers will stay in the low 130's/80 or lower. If they are consistently over 140/90 please let us know.  You likely have significant arthritis in your right hip--you have significantly limited movement in that hip.  Go to [facility]  ([address]) for an x-ray.  We will likely have you follow up with your orthopedist to discuss options.  There are nonsurgical options (including cortisone injection).  Your next prolia will be  due in October, [name] will likely be calling you close to that time to arrange it.

## 2018-04-06 ENCOUNTER — Telehealth: Payer: Self-pay | Admitting: Family Medicine

## 2018-04-06 LAB — COMPREHENSIVE METABOLIC PANEL
A/G RATIO: 1.6 (ref 1.2–2.2)
ALT: 16 IU/L (ref 0–32)
AST: 19 IU/L (ref 0–40)
Albumin: 3.9 g/dL (ref 3.5–4.7)
Alkaline Phosphatase: 31 IU/L — ABNORMAL LOW (ref 39–117)
BUN/Creatinine Ratio: 27 (ref 12–28)
BUN: 24 mg/dL (ref 8–27)
Bilirubin Total: 0.6 mg/dL (ref 0.0–1.2)
CALCIUM: 9.2 mg/dL (ref 8.7–10.3)
CO2: 24 mmol/L (ref 20–29)
CREATININE: 0.89 mg/dL (ref 0.57–1.00)
Chloride: 104 mmol/L (ref 96–106)
GFR, EST AFRICAN AMERICAN: 68 mL/min/{1.73_m2} (ref >59)
GFR, EST NON AFRICAN AMERICAN: 59 mL/min/{1.73_m2} — AB (ref >59)
GLUCOSE: 143 mg/dL — AB (ref 65–99)
Globulin, Total: 2.4 g/dL (ref 1.5–4.5)
Potassium: 4 mmol/L (ref 3.5–5.2)
Sodium: 143 mmol/L (ref 134–144)
TOTAL PROTEIN: 6.3 g/dL (ref 6.0–8.5)

## 2018-04-06 NOTE — Telephone Encounter (Signed)
Received requested records from [facility]. Sending back for review.

## 2018-04-08 ENCOUNTER — Other Ambulatory Visit: Payer: Self-pay | Admitting: *Deleted

## 2018-04-08 LAB — URINE CULTURE

## 2018-04-08 MED ORDER — NITROFURANTOIN MONOHYD MACRO 100 MG PO CAPS: 100.0000 mg | ORAL_CAPSULE | Freq: Two times a day (BID) | ORAL | 0 refills | Status: DC

## 2018-04-09 ENCOUNTER — Other Ambulatory Visit: Payer: Self-pay | Admitting: Family Medicine

## 2018-04-09 DIAGNOSIS — I10 Essential (primary) hypertension: Secondary | ICD-10-CM

## 2018-04-13 ENCOUNTER — Other Ambulatory Visit: Payer: Self-pay | Admitting: Family Medicine

## 2018-04-13 DIAGNOSIS — E7800 Pure hypercholesterolemia, unspecified: Secondary | ICD-10-CM

## 2018-04-16 ENCOUNTER — Other Ambulatory Visit: Payer: Self-pay | Admitting: Family Medicine

## 2018-04-16 DIAGNOSIS — I10 Essential (primary) hypertension: Secondary | ICD-10-CM

## 2018-04-25 ENCOUNTER — Other Ambulatory Visit: Payer: Self-pay | Admitting: Family Medicine

## 2018-04-25 DIAGNOSIS — I10 Essential (primary) hypertension: Secondary | ICD-10-CM

## 2018-04-27 ENCOUNTER — Other Ambulatory Visit: Payer: Self-pay | Admitting: Family Medicine

## 2018-04-27 DIAGNOSIS — Z1231 Encounter for screening mammogram for malignant neoplasm of breast: Secondary | ICD-10-CM

## 2018-05-04 ENCOUNTER — Other Ambulatory Visit: Payer: Self-pay | Admitting: Family Medicine

## 2018-05-04 DIAGNOSIS — I10 Essential (primary) hypertension: Secondary | ICD-10-CM

## 2018-05-07 ENCOUNTER — Other Ambulatory Visit: Payer: Self-pay | Admitting: Neurology

## 2018-05-09 ENCOUNTER — Other Ambulatory Visit: Payer: Self-pay | Admitting: Family Medicine

## 2018-05-09 DIAGNOSIS — I10 Essential (primary) hypertension: Secondary | ICD-10-CM

## 2018-05-10 ENCOUNTER — Telehealth: Payer: Self-pay

## 2018-05-10 ENCOUNTER — Telehealth: Payer: Self-pay | Admitting: Neurology

## 2018-05-10 ENCOUNTER — Other Ambulatory Visit: Payer: Self-pay | Admitting: Neurology

## 2018-05-10 NOTE — Telephone Encounter (Signed)
Pharmacy sent fax stating that patient will need a refill on Clopidogrel. Pt tried to get this medication refilled with Dr. Jannifer Franklin whom originally prescribed it but he suggested pt PCP refill. Pt can't go without this medication.

## 2018-05-10 NOTE — Telephone Encounter (Signed)
Called home number again, went to VM and mailbox full. I tried mobile number, LVM for pt to call. I also tried daughter on DPR and LVM for pt to call office back

## 2018-05-10 NOTE — Telephone Encounter (Signed)
Looks like Dr. Jannifer Franklin' office is trying to take care of this for her by getting her into the office for an earlier visit, but they weren't able to leave her a message.  If you are able to get through to her, let her know to call their office (they are holding a spot for her on 8/29 for an earlier visit than what she is scheduled for).  Last filled #90 on 11/23/17 with a refill--she should have more than 1 pill left (should last until early September).

## 2018-05-10 NOTE — Telephone Encounter (Signed)
Called pt. Could not LVM due to mailbox being full.   Please offer sooner appt on 05/13/18 at 9am if she calls. appt on hold.

## 2018-05-10 NOTE — Telephone Encounter (Signed)
Pt states that she had an earlier in the year appointment with Dr Jannifer Franklin but she was rescheduled one day because of Dr Jannifer Franklin having to be out of the office.  Pt is stating that re: her clopidogrel (PLAVIX) 75 MG tablet she has one pill left and does not want to be without. Please call.  Pt confirmed this does need to be called into  [id number] - [place], [place] - [place] [phone] (Phone) [phone] (Fax)

## 2018-05-11 MED ORDER — CLOPIDOGREL BISULFATE 75 MG PO TABS: 75.0000 mg | ORAL_TABLET | Freq: Every day | ORAL | 1 refills | Status: DC

## 2018-05-11 NOTE — Addendum Note (Signed)
Addended by: Kathrynn Ducking on: 05/11/2018 08:47 AM   Modules accepted: Orders

## 2018-05-11 NOTE — Telephone Encounter (Signed)
Pt has an appt with Dr. Jannifer Franklin in November and not later on this week. He has refilled her med as well so I will disregard this message

## 2018-05-11 NOTE — Telephone Encounter (Signed)
A prescription for Plavix will be sent in.

## 2018-05-11 NOTE — Telephone Encounter (Signed)
Dr. Jannifer Franklin- I tried reaching this pt to move up appt. She has appt 08/03/18. She has not been seen in over a year. Ok to refill plavix until then?

## 2018-05-13 ENCOUNTER — Other Ambulatory Visit: Payer: Self-pay

## 2018-05-13 MED ORDER — NEEDLES & SYRINGES MISC: 1.0000 | Freq: Once | 1 refills | Status: AC

## 2018-05-13 NOTE — Telephone Encounter (Signed)
[facility] on free style lancets for patient. Is this appropriate?

## 2018-06-04 ENCOUNTER — Ambulatory Visit
Admission: RE | Admit: 2018-06-04 | Discharge: 2018-06-04 | Disposition: A | Discharge status: Home / Self Care | Payer: Medicare Other | Source: Ambulatory Visit | Attending: Family Medicine | Admitting: Family Medicine

## 2018-06-04 DIAGNOSIS — Z1231 Encounter for screening mammogram for malignant neoplasm of breast: Secondary | ICD-10-CM

## 2018-06-05 ENCOUNTER — Other Ambulatory Visit: Payer: Self-pay | Admitting: Family Medicine

## 2018-06-05 DIAGNOSIS — I10 Essential (primary) hypertension: Secondary | ICD-10-CM

## 2018-06-09 ENCOUNTER — Other Ambulatory Visit: Payer: Self-pay | Admitting: Family Medicine

## 2018-06-09 NOTE — Telephone Encounter (Signed)
Is this refill appropriate?  

## 2018-06-25 ENCOUNTER — Other Ambulatory Visit (INDEPENDENT_AMBULATORY_CARE_PROVIDER_SITE_OTHER): Payer: Medicare Other

## 2018-06-25 DIAGNOSIS — M810 Age-related osteoporosis without current pathological fracture: Secondary | ICD-10-CM | POA: Diagnosis not present

## 2018-06-25 MED ORDER — DENOSUMAB 60 MG/ML ~~LOC~~ SOSY
60.0000 mg | PREFILLED_SYRINGE | Freq: Once | SUBCUTANEOUS | Status: AC
  Administered 2018-06-25: 60 mg via SUBCUTANEOUS

## 2018-07-02 ENCOUNTER — Other Ambulatory Visit: Payer: Self-pay | Admitting: Family Medicine

## 2018-07-02 DIAGNOSIS — I10 Essential (primary) hypertension: Secondary | ICD-10-CM

## 2018-07-09 ENCOUNTER — Other Ambulatory Visit: Payer: Self-pay | Admitting: Family Medicine

## 2018-07-09 DIAGNOSIS — I10 Essential (primary) hypertension: Secondary | ICD-10-CM

## 2018-07-15 ENCOUNTER — Other Ambulatory Visit: Payer: Self-pay | Admitting: Family Medicine

## 2018-07-15 DIAGNOSIS — E7800 Pure hypercholesterolemia, unspecified: Secondary | ICD-10-CM

## 2018-07-28 ENCOUNTER — Other Ambulatory Visit: Payer: Self-pay | Admitting: Family Medicine

## 2018-07-28 ENCOUNTER — Other Ambulatory Visit: Payer: Self-pay | Admitting: Medical

## 2018-07-28 DIAGNOSIS — I10 Essential (primary) hypertension: Secondary | ICD-10-CM

## 2018-08-03 ENCOUNTER — Ambulatory Visit: Payer: Medicare Other | Admitting: Neurology

## 2018-08-03 ENCOUNTER — Other Ambulatory Visit: Payer: Self-pay

## 2018-08-03 ENCOUNTER — Encounter

## 2018-08-03 ENCOUNTER — Encounter: Payer: Self-pay | Admitting: Neurology

## 2018-08-03 VITALS — BP 141/78 | HR 62 | Resp 18 | Ht 66.0 in | Wt 195.0 lb

## 2018-08-03 DIAGNOSIS — G1119 Other early-onset cerebellar ataxia: Secondary | ICD-10-CM

## 2018-08-03 DIAGNOSIS — R269 Unspecified abnormalities of gait and mobility: Secondary | ICD-10-CM

## 2018-08-03 DIAGNOSIS — G111 Early-onset cerebellar ataxia: Secondary | ICD-10-CM | POA: Diagnosis not present

## 2018-08-03 MED ORDER — CLOPIDOGREL BISULFATE 75 MG PO TABS: 75.0000 mg | ORAL_TABLET | Freq: Every day | ORAL | 3 refills | Status: DC

## 2018-08-03 NOTE — Progress Notes (Signed)
Reason for visit: Peripheral neuropathy, gait abnormality  Kristin Willis is an 84 y.o. female  History of present illness:  Kristin Willis is an 84 year old right-handed white female with a history of diabetes with a neuropathy.  The patient has a gait disorder, she uses a cane while outside of the house.  She has not had any falls, but she feels as if her balance is becoming more unsteady.  The patient is on Plavix for cerebrovascular disease, she has not had any further events of TIA type episodes.  The patient has degenerative arthritis of the right shoulder and right knee making it difficult to use her arm and her right leg.  The patient otherwise is doing well.  She had a brief left temporal headache on 2 occasions while driving, not at other times.  The episodes only lasted a few minutes.  She returns to this office for an evaluation.  Past Medical History:  Diagnosis Date  . Abnormality of gait 07/21/2016  . Arthritis    right knee, left wrist  . Asthma    related to allergies when working; resolved  . Breast cancer ([place]) 7/08 Inrasine ductal L breast  . Colon polyps    4/09--adenomatous. 04/2010--normal.  Repeat 2014  . DDD (degenerative disc disease), lumbar 4/06  . Diabetes mellitus    diet controlled  . Diabetic retinopathy   . DJD (degenerative joint disease)   . Dyslipidemia   . Endometrial cancer ([facility]) hx  . Gout   . Hepatitis A history(in [place]-1950's)  . History of stroke 10/24/2013  . Hypertension   . Incontinence   . Membranous glomerulonephropathy    [name]  . Mini stroke [facility]) DrWillis  . Obesity, unspecified   . Osteopenia L hip, 4/06  . 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 Skin cancer    [name]  . Sliding hiatal hernia small sliding hiatal hernia  . Wears glasses     Past Surgical History:  Procedure Laterality Date  . ABDOMINAL HYSTERECTOMY     endometrial cancer  . APPENDECTOMY    . BREAST LUMPECTOMY Left 8/08 L Breast  ([name])  . CATARACT EXTRACTION    . COLONOSCOPY  8/08 [name] (normal)  . kidney stones    . left middle finger    . REPLACEMENT TOTAL KNEE  Left  . SKIN CANCER EXCISION    . TOTAL KNEE ARTHROPLASTY     left    Family History  Problem Relation Age of Onset  . Heart attack Father   . Diabetes Father   . Heart attack Mother   . Hypertension Mother   . Diabetes Mother   . Hypertension Brother   . Stroke Brother   . Melanoma Brother   . Diabetes Brother   . Hypertension Son   . Alcohol abuse Son   . Stroke Paternal Grandmother   . Diabetes Paternal Grandfather   . Vision loss Paternal Grandfather        related to diabetes  . Cancer Neg Hx     Social history:  reports that she has never smoked. She has never used smokeless tobacco. She reports that she does not drink alcohol or use drugs.    Allergies  Allergen Reactions  . Ciprofloxacin Nausea And Vomiting  . Codeine Nausea And Vomiting  . Contrast Media [Iodinated Diagnostic Agents] Nausea And Vomiting  . Morphine And Related Nausea And Vomiting  . Statins Other (See Comments)  lethargic  . Toprol Xl [Metoprolol Tartrate]     Symptoms of heart attack, per pt, told not to take it again (?chest pain)  . Clindamycin/Lincomycin Itching and Rash  . Penicillins Hives, Swelling and Rash    Has patient had a PCN reaction causing immediate rash, facial/tongue/throat swelling, SOB or lightheadedness with hypotension: no Has patient had a PCN reaction causing severe rash ivolving mucus membranes or skin necrosis: no Has patient had a PCN reaction that required hospitalization no Has patient had a PCN reaction occurring within the last 10 years: no If all of the above answers are "NO", then may proceed with Cephalosporin use.  . Prednisone Palpitations    Medications:  Prior to Admission medications   Medication Sig Start Date End Date Taking? Authorizing Provider  Acetaminophen (TYLENOL ARTHRITIS PAIN PO) Take 2  tablets by mouth 2 (two) times daily.    Yes [provider]  amLODipine (NORVASC) 5 MG tablet TAKE 1 TABLET BY MOUTH EVERY DAY 07/28/18  Yes Rita Ohara, MD  Apoaequorin (PREVAGEN PO) Take 1 tablet by mouth daily.   Yes [provider]  Blood Glucose Monitoring Suppl (ACCU-CHEK GUIDE) w/Device KIT 1 each by Does not apply route 2 (two) times daily. 07/02/17  Yes Rita Ohara, MD  Calcium Carbonate-Vitamin D (CALTRATE 600+D PO) Take 2 tablets by mouth 2 (two) times daily.    Yes [provider]  carvedilol (COREG) 25 MG tablet TAKE 1 & 1/2 TABLETS BY MOUTH TWICE A DAY 07/28/18  Yes Rita Ohara, MD  cholecalciferol (VITAMIN D) 1000 units tablet Take 1,000 Units by mouth daily.   Yes [provider]  clopidogrel (PLAVIX) 75 MG tablet Take 1 tablet (75 mg total) by mouth daily. 05/11/18  Yes Kathrynn Ducking, MD  Coenzyme Q10 (CO Q 10 PO) Take 1 tablet by mouth daily.    Yes [provider]  ezetimibe (ZETIA) 10 MG tablet TAKE 1 TABLET BY MOUTH EVERY DAY 07/15/18  Yes Rita Ohara, MD  gabapentin (NEURONTIN) 300 MG capsule TAKE 1 CAPSULE BY MOUTH EVERY NIGHT AT BEDTIME 06/09/18  Yes Rita Ohara, MD  glucose blood (ACCU-CHEK GUIDE) test strip 1 each by Other route 2 (two) times daily. Use as instructed 07/02/17  Yes Rita Ohara, MD  hydrochlorothiazide (HYDRODIURIL) 12.5 MG tablet TAKE 1 TABLET BY MOUTH EVERY DAY 04/26/18  Yes Rita Ohara, MD  KLOR-CON M10 10 MEQ tablet TAKE 1 TABLET BY MOUTH EVERY DAY 07/09/18  Yes Rita Ohara, MD  LANCETS MICRO THIN 33G MISC 1 each by Does not apply route 2 (two) times daily. 07/02/17  Yes Rita Ohara, MD  losartan (COZAAR) 100 MG tablet TAKE 1 TABLET BY MOUTH EVERY DAY 07/09/18  Yes Rita Ohara, MD  Misc Natural Products (OSTEO BI-FLEX ADV TRIPLE ST PO) Take 2 capsules by mouth daily.   Yes [provider]  Multiple Vitamin (MULTIVITAMIN) tablet Take 1 tablet by mouth daily.   Yes [provider]  nitrofurantoin,  macrocrystal-monohydrate, (MACROBID) 100 MG capsule Take 1 capsule (100 mg total) by mouth 2 (two) times daily. 04/08/18  Yes Rita Ohara, MD  Omega-3 Fatty Acids (FISH OIL) 1000 MG CAPS Take 1 capsule by mouth 2 (two) times daily.   Yes [provider]  solifenacin (VESICARE) 5 MG tablet Take 5 mg by mouth daily.    Yes [provider]  Specialty Vitamins Products ([facility] HAIR/SKIN/NAILS PO) Take 2 each by mouth daily.   Yes [provider]  cephALEXin (KEFLEX)  500 MG capsule Take 1 tablet prior to dental procedure, take second tablet evening after dental procedure Patient not taking: Reported on 12/07/2017 08/06/15   Rita Ohara, MD  potassium chloride (K-DUR) 10 MEQ tablet TAKE 1 TABLET BY MOUTH EVERY DAY 07/27/13 01/23/14  Rita Ohara, MD    ROS:  Out of a complete 14 system review of symptoms, the patient complains only of the following symptoms, and all other reviewed systems are negative.  Eye discharge Hearing loss Frequency of urination Muscle cramps Headache Anxiety  Blood pressure (!) 141/78, pulse 62, resp. rate 18, height 5' 6"  (1.676 m), weight 195 lb (88.5 kg).  Physical Exam  General: The patient is alert and cooperative at the time of the examination.  Skin: No significant peripheral edema is noted.   Neurologic Exam  Mental status: The patient is alert and oriented x 3 at the time of the examination. The patient has apparent normal recent and remote memory, with an apparently normal attention span and concentration ability.   Cranial nerves: Facial symmetry is present. Speech is normal, no aphasia or dysarthria is noted. Extraocular movements are full. Visual fields are full.  Motor: The patient has good strength in all 4 extremities.  Sensory examination: Soft touch sensation is symmetric on the face, arms, and legs.  Coordination: The patient has good finger-nose-finger and heel-to-shin bilaterally.  Gait and station: The patient has a  slightly wide-based gait, with a limping gait on the right leg.  The patient walks with a cane.  Romberg is negative.  Reflexes: Deep tendon reflexes are symmetric.   Assessment/Plan:  1.  Gait disorder  2.  Diabetes, diabetic neuropathy  3.  Cerebrovascular disease  The patient will remain on the Plavix, a prescription was sent in.  The patient will follow-up in 1 year or as needed.  The patient will contact our office if any new issues arise.  Greater than 50% of the visit was spent in counseling and coordination of care.  Face-to-face time with the patient was 20 minutes.   Jill Alexanders MD 08/03/2018 3:59 PM  [facility] [address] [place], [place] [address]  Phone [phone] Fax [phone]

## 2018-10-03 NOTE — Progress Notes (Signed)
Chief Complaint  Patient presents with  . Hypertension    6 month med check. On a new multi vitamin, she brought in for you to see. Did give me a urine sample.    Patient presents for 6 month med check.  Osteoporosis: DEXA was done in 09/2017, and showed significant worsening at Left hip, now osteoporosis. T-2.7 atL fem neck (significant decline from 2015, was -1.4); T-1.3 L wrist (was -1.1).  She was started on Prolia injections in April, 2019, last injection was 06/25/18. She is tolerating Prolia without side effects. She is taking Citracal petites, 2 tablets (421m Ca, 500 IU of D) once daily, and drinks 1 glass of milk and has cheese.  She is also taking Vitamin D3 1000 IU daily. She continues to take MVI  (contained 400 IU). Vitamin D level was 45.4 in March (after taking 5000 IU daily x 2 mos).  Hypertension:  She reports compliance with taking amlodipine, carvedilol, HCTZ (and klorcon), andlosartan. The losartan was cut back to once daily after her blood pressures had been low last year, but at her last visit (July), she was noted to have inadvertantly gone back up to BID dosing, as that is what the bottle said (had refills). She is currently taking it just once daily, as directed. Denies any lightheadedness, dizziness.   She didn't bring list of blood pressures--battery just died.  Recalls BP was "normal", and that diastolic is always under 70.   Diabetes: Controlled by diet, she has never taken medications. Checks sugars about once a week, and are running "normal"--she can't remember the values.  Lab Results  Component Value Date   HGBA1C 5.7 (A) 04/05/2018   She had been on the Next 56 Day diet, lost 20#. In July it was noted that she had regained some (6#) --family moved in with her, and she wasn't going to the meetings. They just moved out last Saturday (daughter, husband and 2 grandchildren + dog). She had to eat what they were eating. She feels like her diet will improve now  that they're gone.  Lastdiabeticeye exam was 09/2017, showed retinopathy. She is scheduled for later this month. Denies polydipsia (some dry mouth from meds, no changes), polyuria (drinks a lot, no changes). She has numbness in her left foot since her minor stroke, unchanged.  Her son has cirrhosis, waiting for liver transplant ([place].  She has been very sad.  He hasn't been drinking x 3 mos (needs to be off x 6 months before he will be considered for a transplant.)  She doesn't think he will get one, but has started to show some improvement. She worries a lot about him, would rather give her life for his (ie donate liver), as she has led a full life.   Reads Al-anon meditations.  Bible study is a good support for her. Denies interest in other counselors/therapist or medication.  OAB--seen atDr. McDiarmid's officemonthly for transcutaneous nerve stimulationtreatments (sees him just 1-2x/year). She wears pads all the time, and special underwear at night, getting up2-3x/night. Overall doing much better on the treatments and the Vesicare. Myrbetriq was stopped (pt states it caused UTI).  She has been on Cipro for a week, with about another week left.  Taking it TID (?? Unsure of dose but she is insistent it is TID). No longer having any dysuria, resolved with the antibiotic. Continues to have bladder prolapse, and pessaries did not work (fall out when she stands, tried various kinds).  Hyperlipidemia-- SJamesetta Geralds  fish oil capsule that she found that does not have red dye, '1000mg'$ , taking 1 daily.Sometimes this repeats on her. Trying to follow a low fat diet. She limits her carbs, sweets, fried foods. Snacks on cheese. Compliant with Zetia. Lab Results  Component Value Date   CHOL 141 08/17/2017   HDL 42 (L) 08/17/2017   LDLCALC 77 08/17/2017   TRIG 135 08/17/2017   CHOLHDL 3.4 08/17/2017   Memory concerns: She has been taking Prevagen forat least3years to help with memory,  and she finds it effective. She notices improvement--easier to recall names. She is very busy, but keeps up with everything by keeping an organized, detailed calendar.Some days are better than others--reports that "today is not a good day".Denies any memory concerns--doesn't lose keys, car, get lost driving. Just needs to write things down more.  She reports today isn't good because she didn't sleep well last night.  Lately she has been waking up with sciatica at 4am, which interrupts her sleep.  Pain is in left hip. She gets up to her recliner, pain doesn't last long, and doesn't bother her during the day at all.  H/o CVA, on Plavix. She has residual numbness of L foot. Also felt to have a mild peripheral neuropathy, possibly related to her diabetes (diet controlled). She remains on gabapentin (started by Dr. Jana Hakim in the past). She denies any pain in her foot, just a mild tingling/numbness. She remains onPlavix. Denies any bleeding or side effects(some bruising on hands, forearms). She sees Dr. Jannifer Franklin once yearly, last seen in November.  Gout: She hasn't had a flare in many years. She previously took allopurinol, but it was stopped over 2years ago. No flares since stopping it. Denies any pain/flares. Last check of uric acid was after stopping allopurinol.  Hasn't had any problems since. Lab Results  Component Value Date   LABURIC 4.8 02/16/2017    Membranous nephropathy--Previously saw [name] yearly (last 12/2017). She continues to be in remission per her last report. Doing well. [name] will be retiring, and asked PCP to do yearly u/a for proteinuria.  There was no protein in her urine from 03/2018 check.   PMH, PSH, SH reviewed  Outpatient Encounter Medications as of 10/04/2018  Medication Sig Note  . Acetaminophen (TYLENOL ARTHRITIS PAIN PO) Take 2 tablets by mouth 2 (two) times daily.    Marland Kitchen amLODipine (NORVASC) 5 MG tablet TAKE 1 TABLET BY MOUTH EVERY DAY   .  Apoaequorin (PREVAGEN PO) Take 1 tablet by mouth daily.   . Blood Glucose Monitoring Suppl (ACCU-CHEK GUIDE) w/Device KIT 1 each by Does not apply route 2 (two) times daily.   . Calcium Carbonate-Vitamin D (CALTRATE 600+D PO) Take 2 tablets by mouth 2 (two) times daily.    . carvedilol (COREG) 25 MG tablet TAKE 1 & 1/2 TABLETS BY MOUTH TWICE A DAY   . cholecalciferol (VITAMIN D) 1000 units tablet Take 1,000 Units by mouth daily.   . ciprofloxacin (CIPRO) 500 MG tablet Take 500 mg by mouth 3 (three) times daily. 10/04/2018: She was given about 2 week supply, has been taking x 1 week.  Unsure of the mg dose, but IS taking TID  . clopidogrel (PLAVIX) 75 MG tablet Take 1 tablet (75 mg total) by mouth daily.   . Coenzyme Q10 (CO Q 10 PO) Take 1 tablet by mouth daily.    Marland Kitchen ezetimibe (ZETIA) 10 MG tablet TAKE 1 TABLET BY MOUTH EVERY DAY   . gabapentin (NEURONTIN) 300  MG capsule TAKE 1 CAPSULE BY MOUTH EVERY NIGHT AT BEDTIME   . glucose blood (ACCU-CHEK GUIDE) test strip 1 each by Other route 2 (two) times daily. Use as instructed   . hydrochlorothiazide (HYDRODIURIL) 12.5 MG tablet TAKE 1 TABLET BY MOUTH EVERY DAY   . KLOR-CON M10 10 MEQ tablet TAKE 1 TABLET BY MOUTH EVERY DAY   . LANCETS MICRO THIN 33G MISC 1 each by Does not apply route 2 (two) times daily.   Marland Kitchen losartan (COZAAR) 100 MG tablet TAKE 1 TABLET BY MOUTH EVERY DAY   . Misc Natural Products (OSTEO BI-FLEX ADV TRIPLE ST PO) Take 2 capsules by mouth daily.   . Omega-3 Fatty Acids (FISH OIL) 1000 MG CAPS Take 1 capsule by mouth 2 (two) times daily. 12/07/2017: Reservatrol 2 daily-omega plus  . Specialty Vitamins Products ([facility] HAIR/SKIN/NAILS PO) Take 2 each by mouth daily.   . cephALEXin (KEFLEX) 500 MG capsule Take 1 tablet prior to dental procedure, take second tablet evening after dental procedure (Patient not taking: Reported on 12/07/2017)   . Multiple Vitamin (MULTIVITAMIN) tablet Take 1 tablet by mouth daily.   . [DISCONTINUED]  nitrofurantoin, macrocrystal-monohydrate, (MACROBID) 100 MG capsule Take 1 capsule (100 mg total) by mouth 2 (two) times daily. (Patient not taking: Reported on 10/04/2018) 08/03/2018: Takes PRN/fim  . [DISCONTINUED] potassium chloride (K-DUR) 10 MEQ tablet TAKE 1 TABLET BY MOUTH EVERY DAY   . [DISCONTINUED] solifenacin (VESICARE) 5 MG tablet Take 5 mg by mouth daily.     No facility-administered encounter medications on file as of 10/04/2018.    Allergies  Allergen Reactions  . Ciprofloxacin Nausea And Vomiting  . Codeine Nausea And Vomiting  . Contrast Media [Iodinated Diagnostic Agents] Nausea And Vomiting  . Morphine And Related Nausea And Vomiting  . Statins Other (See Comments)    lethargic  . Toprol Xl [Metoprolol Tartrate]     Symptoms of heart attack, per pt, told not to take it again (?chest pain)  . Clindamycin/Lincomycin Itching and Rash  . Penicillins Hives, Swelling and Rash    Has patient had a PCN reaction causing immediate rash, facial/tongue/throat swelling, SOB or lightheadedness with hypotension: no Has patient had a PCN reaction causing severe rash ivolving mucus membranes or skin necrosis: no Has patient had a PCN reaction that required hospitalization no Has patient had a PCN reaction occurring within the last 10 years: no If all of the above answers are "NO", then may proceed with Cephalosporin use.  . Prednisone Palpitations   ROS:  No fever, chills, URI symptoms, headaches, dizziness, chest pain, shortness of breath, cough, nausea, vomiting, bowel changes (diarrhea after onions, only), dysuria, +incontinence per HPI, stable. Recent dysuria resolved with ABX.  Denies bleeding, rash. Bruises on forearms only.  Stress/sadness related to her son's alcoholism and liver failure. Knee pain, walks with cane. Recently has also been having some left-sided sciatica per HPI   PHYSICAL EXAM:  BP 130/60   Pulse 60   Ht 5' 6"  (1.676 m)   Wt 198 lb 12.8 oz (90.2 kg)   BMI  32.09 kg/m   Wt Readings from Last 3 Encounters:  10/04/18 198 lb 12.8 oz (90.2 kg)  08/03/18 195 lb (88.5 kg)  04/05/18 195 lb 9.6 oz (88.7 kg)   Well appearing, pleasant female, in good spirits, talkative. Intermittently tearful in discussing her son.  HEENT: conjunctiva and sclera clear, EOMI, OP with moist mucus membranes Neck: no lymphadenopathy or mass Heart: regular rate and rhythm  Lungs: clear bilaterally Abdomen: soft, nontender, no mass. Small umbilical hernia, easily reducible.  Back: no spinal or CVA tenderness, no SI tenderness Extremities: trace edema at ankles only, palpable pulses. Decreased monofilament sensation over both great toes.  Neuro: alert and oriented, cranial nerves intact. Normal strength.  Skin: normal turgor, no rash. Bruising and purpura on forearms and hands. Psych: normal mood, affect, hygiene and grooming.  Tearful in discussing her son, but full range of affect. Normal eye contact, speech.   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  scheduled for f/u ophtho this month - Plan: HgB A1c, Microalbumin / creatinine urine ratio  Essential hypertension, benign - well controlled - Plan: Comprehensive metabolic panel  Osteoporosis without current pathological fracture, unspecified osteoporosis type - Continue Prolia injections every 6 months, Ca, D, weight-bearing exercise  Mixed hyperlipidemia - Plan: Lipid panel  Senile purpura ([place]) - Plan: CBC with Differential/Platelet  Medication monitoring encounter - Plan: CBC with Differential/Platelet, Comprehensive metabolic panel, Lipid panel, Microalbumin / creatinine urine ratio  History of stroke - continue Plavix per neuro  Nonproliferative  diabetic retinopathy ([place]) - Due for diabetic eye exam  Immunization due - reminded her she is due for 2nd Shingrix.  Hasn't had avaiable at her pharmacy, to check other pharmacies. To ensure she gets it within 6 month period of first.  F/u 6 mos for CPE/AWV

## 2018-10-04 ENCOUNTER — Ambulatory Visit: Payer: Medicare Other | Admitting: Family Medicine

## 2018-10-04 ENCOUNTER — Encounter: Payer: Self-pay | Admitting: Family Medicine

## 2018-10-04 VITALS — BP 130/60 | HR 60 | Ht 66.0 in | Wt 198.8 lb

## 2018-10-04 DIAGNOSIS — E782 Mixed hyperlipidemia: Secondary | ICD-10-CM

## 2018-10-04 DIAGNOSIS — E7800 Pure hypercholesterolemia, unspecified: Secondary | ICD-10-CM

## 2018-10-04 DIAGNOSIS — M810 Age-related osteoporosis without current pathological fracture: Secondary | ICD-10-CM | POA: Diagnosis not present

## 2018-10-04 DIAGNOSIS — Z8673 Personal history of transient ischemic attack (TIA), and cerebral infarction without residual deficits: Secondary | ICD-10-CM

## 2018-10-04 DIAGNOSIS — D692 Other nonthrombocytopenic purpura: Secondary | ICD-10-CM

## 2018-10-04 DIAGNOSIS — I10 Essential (primary) hypertension: Secondary | ICD-10-CM

## 2018-10-04 DIAGNOSIS — E113299 Type 2 diabetes mellitus with mild nonproliferative diabetic retinopathy without macular edema, unspecified eye: Secondary | ICD-10-CM

## 2018-10-04 DIAGNOSIS — Z23 Encounter for immunization: Secondary | ICD-10-CM

## 2018-10-04 DIAGNOSIS — E11319 Type 2 diabetes mellitus with unspecified diabetic retinopathy without macular edema: Secondary | ICD-10-CM

## 2018-10-04 DIAGNOSIS — Z5181 Encounter for therapeutic drug level monitoring: Secondary | ICD-10-CM

## 2018-10-04 LAB — POCT GLYCOSYLATED HEMOGLOBIN (HGB A1C): HEMOGLOBIN A1C: 5.6 % (ref 4.0–5.6)

## 2018-10-04 NOTE — Patient Instructions (Signed)
Continue your current medications. We will be in touch with your test results later this week. Continue to work on healthy diet, portions and weight loss. Continue to try and exercise daily.  Reach out if your moods are worsening despite your current support systems

## 2018-10-05 LAB — CBC WITH DIFFERENTIAL/PLATELET
Basophils Absolute: 0.1 10*3/uL (ref 0.0–0.2)
Basos: 1 %
EOS (ABSOLUTE): 0.3 10*3/uL (ref 0.0–0.4)
EOS: 4 %
HEMATOCRIT: 41.9 % (ref 34.0–46.6)
HEMOGLOBIN: 13.9 g/dL (ref 11.1–15.9)
Immature Grans (Abs): 0 10*3/uL (ref 0.0–0.1)
Immature Granulocytes: 0 %
LYMPHS ABS: 2.9 10*3/uL (ref 0.7–3.1)
Lymphs: 44 %
MCH: 32.1 pg (ref 26.6–33.0)
MCHC: 33.2 g/dL (ref 31.5–35.7)
MCV: 97 fL (ref 79–97)
MONOCYTES: 7 %
Monocytes Absolute: 0.5 10*3/uL (ref 0.1–0.9)
Neutrophils Absolute: 2.9 10*3/uL (ref 1.4–7.0)
Neutrophils: 44 %
Platelets: 189 10*3/uL (ref 150–450)
RBC: 4.33 x10E6/uL (ref 3.77–5.28)
RDW: 11.9 % (ref 11.7–15.4)
WBC: 6.7 10*3/uL (ref 3.4–10.8)

## 2018-10-05 LAB — COMPREHENSIVE METABOLIC PANEL
A/G RATIO: 1.5 (ref 1.2–2.2)
ALBUMIN: 4.1 g/dL (ref 3.6–4.6)
ALK PHOS: 33 IU/L — AB (ref 39–117)
ALT: 13 IU/L (ref 0–32)
AST: 18 IU/L (ref 0–40)
BILIRUBIN TOTAL: 0.6 mg/dL (ref 0.0–1.2)
BUN/Creatinine Ratio: 29 — ABNORMAL HIGH (ref 12–28)
BUN: 24 mg/dL (ref 8–27)
CHLORIDE: 99 mmol/L (ref 96–106)
CO2: 27 mmol/L (ref 20–29)
CREATININE: 0.83 mg/dL (ref 0.57–1.00)
Calcium: 10.3 mg/dL (ref 8.7–10.3)
GFR calc Af Amer: 73 mL/min/{1.73_m2} (ref >59)
GFR calc non Af Amer: 64 mL/min/{1.73_m2} (ref >59)
Globulin, Total: 2.7 g/dL (ref 1.5–4.5)
Glucose: 100 mg/dL — ABNORMAL HIGH (ref 65–99)
Potassium: 4.3 mmol/L (ref 3.5–5.2)
SODIUM: 140 mmol/L (ref 134–144)
Total Protein: 6.8 g/dL (ref 6.0–8.5)

## 2018-10-05 LAB — LIPID PANEL
CHOLESTEROL TOTAL: 147 mg/dL (ref 100–199)
Chol/HDL Ratio: 3.7 ratio (ref 0.0–4.4)
HDL: 40 mg/dL (ref >39)
LDL CALC: 75 mg/dL (ref 0–99)
Triglycerides: 160 mg/dL — ABNORMAL HIGH (ref 0–149)
VLDL Cholesterol Cal: 32 mg/dL (ref 5–40)

## 2018-10-05 LAB — MICROALBUMIN / CREATININE URINE RATIO
Creatinine, Urine: 78.5 mg/dL
Microalb/Creat Ratio: 36 mg/g creat — ABNORMAL HIGH (ref 0–29)
Microalbumin, Urine: 28 ug/mL

## 2018-10-05 MED ORDER — EZETIMIBE 10 MG PO TABS: 10.0000 mg | ORAL_TABLET | Freq: Every day | ORAL | 3 refills | Status: DC

## 2018-10-05 MED ORDER — HYDROCHLOROTHIAZIDE 12.5 MG PO TABS: 12.5000 mg | ORAL_TABLET | Freq: Every day | ORAL | 5 refills | Status: DC

## 2018-10-05 MED ORDER — CARVEDILOL 25 MG PO TABS: ORAL_TABLET | ORAL | 1 refills | Status: DC

## 2018-10-05 MED ORDER — LOSARTAN POTASSIUM 100 MG PO TABS: 100.0000 mg | ORAL_TABLET | Freq: Every day | ORAL | 1 refills | Status: DC

## 2018-10-05 MED ORDER — AMLODIPINE BESYLATE 5 MG PO TABS: 5.0000 mg | ORAL_TABLET | Freq: Every day | ORAL | 11 refills | Status: DC

## 2018-10-05 MED ORDER — POTASSIUM CHLORIDE CRYS ER 10 MEQ PO TBCR: 10.0000 meq | EXTENDED_RELEASE_TABLET | Freq: Every day | ORAL | 3 refills | Status: DC

## 2018-10-11 LAB — HM DIABETES EYE EXAM

## 2018-10-26 ENCOUNTER — Other Ambulatory Visit: Payer: Self-pay | Admitting: Family Medicine

## 2018-10-26 DIAGNOSIS — I10 Essential (primary) hypertension: Secondary | ICD-10-CM

## 2018-11-29 ENCOUNTER — Other Ambulatory Visit: Payer: Self-pay | Admitting: Family Medicine

## 2018-12-20 ENCOUNTER — Telehealth: Payer: Self-pay | Admitting: Family Medicine

## 2018-12-20 NOTE — Telephone Encounter (Signed)
Left message for pt to call me back. Needs prolia scheduled after 12/25/2018. Please advise [name].

## 2018-12-28 ENCOUNTER — Other Ambulatory Visit: Payer: Medicare Other

## 2018-12-31 ENCOUNTER — Other Ambulatory Visit: Payer: Self-pay

## 2018-12-31 ENCOUNTER — Other Ambulatory Visit: Payer: Medicare Other

## 2018-12-31 DIAGNOSIS — M810 Age-related osteoporosis without current pathological fracture: Secondary | ICD-10-CM | POA: Diagnosis not present

## 2018-12-31 MED ORDER — DENOSUMAB 60 MG/ML ~~LOC~~ SOSY
60.0000 mg | PREFILLED_SYRINGE | Freq: Once | SUBCUTANEOUS | Status: AC
  Administered 2018-12-31: 11:00:00 60 mg via SUBCUTANEOUS

## 2019-01-13 ENCOUNTER — Other Ambulatory Visit: Payer: Self-pay | Admitting: Family Medicine

## 2019-01-13 DIAGNOSIS — I10 Essential (primary) hypertension: Secondary | ICD-10-CM

## 2019-01-27 ENCOUNTER — Telehealth: Payer: Self-pay | Admitting: Family Medicine

## 2019-01-27 NOTE — Telephone Encounter (Signed)
Pharmacy sent in stating cozaar 100 mg is on back order, please send in new rx please send in new RX [id number] [place], [place]

## 2019-01-27 NOTE — Telephone Encounter (Signed)
Patient did not need yet, [facility] on [address] does have if needed.

## 2019-02-23 ENCOUNTER — Other Ambulatory Visit: Payer: Self-pay | Admitting: Family Medicine

## 2019-02-23 DIAGNOSIS — I10 Essential (primary) hypertension: Secondary | ICD-10-CM

## 2019-02-23 NOTE — Telephone Encounter (Signed)
Pt has appointment 05/12/2019

## 2019-03-08 ENCOUNTER — Telehealth: Payer: Self-pay | Admitting: Family Medicine

## 2019-03-08 DIAGNOSIS — I10 Essential (primary) hypertension: Secondary | ICD-10-CM

## 2019-03-08 MED ORDER — LOSARTAN POTASSIUM 100 MG PO TABS: 100.0000 mg | ORAL_TABLET | Freq: Every day | ORAL | 0 refills | Status: DC

## 2019-03-08 NOTE — Telephone Encounter (Signed)
I checked with [facility] on [address], and they had 100mg  of losartan.  Rx was phoned in for #90. LMOM for patient advising her of this.  Veronica--can you please touch base with her Wednesday and be sure she got the message, that her losartan is at [facility] (for #90).

## 2019-03-08 NOTE — Telephone Encounter (Signed)
I don't want to change the medicine to a completely different medicine if I don't have to.  Did she check other pharmacies also asking about the 50mg  tablets?  If those are available, she can take two of them.

## 2019-03-08 NOTE — Telephone Encounter (Signed)
Pt says that she has called as far as [place] and she can not get it fill any where else. Please advise [facility]

## 2019-03-08 NOTE — Telephone Encounter (Signed)
She is not able to get her Losartan potassium 100mg  Because it comes from [place], pharmacy can not get it She has been calling around to other pharmacies and no one has it Can you call in different rx She has 1 pill left for today, she is aware that Dr. Tomi Bamberger not in today    [facility] [address]  Please call pt

## 2019-03-09 NOTE — Telephone Encounter (Signed)
Patient aware.

## 2019-04-01 ENCOUNTER — Other Ambulatory Visit: Payer: Self-pay | Admitting: Family Medicine

## 2019-04-01 NOTE — Telephone Encounter (Signed)
Called and patient stated she does need a refill on these strip in particular. Refill is being sent per patient

## 2019-04-08 ENCOUNTER — Other Ambulatory Visit: Payer: Self-pay | Admitting: Family Medicine

## 2019-04-08 DIAGNOSIS — I10 Essential (primary) hypertension: Secondary | ICD-10-CM

## 2019-04-20 ENCOUNTER — Other Ambulatory Visit: Payer: Self-pay | Admitting: Family Medicine

## 2019-04-20 DIAGNOSIS — I10 Essential (primary) hypertension: Secondary | ICD-10-CM

## 2019-04-25 ENCOUNTER — Other Ambulatory Visit: Payer: Self-pay | Admitting: Family Medicine

## 2019-04-25 DIAGNOSIS — Z1231 Encounter for screening mammogram for malignant neoplasm of breast: Secondary | ICD-10-CM

## 2019-04-28 ENCOUNTER — Other Ambulatory Visit: Payer: Self-pay | Admitting: Family Medicine

## 2019-04-28 NOTE — Telephone Encounter (Signed)
Last filled 11/29/2018 for 90 x 1 refill, should last 6 months since she only takes 1/d.  Is requesting a month early. Not sure if she has a particular reason for early request (doubt it, not likely traveling, but check with pt to see)

## 2019-04-28 NOTE — Telephone Encounter (Signed)
Patient states that she only takes 1 a day and has 6 pills left.  She states that she needs refill.

## 2019-04-28 NOTE — Telephone Encounter (Signed)
Is this ok to refill?  

## 2019-05-11 NOTE — Patient Instructions (Addendum)
  HEALTH MAINTENANCE RECOMMENDATIONS:  It is recommended that you get at least 30 minutes of aerobic exercise at least 5 days/week (for weight loss, you may need as much as 60-90 minutes). This can be any activity that gets your heart rate up. This can be divided in 10-15 minute intervals if needed, but try and build up your endurance at least once a week.  Weight bearing exercise is also recommended twice weekly.  Eat a healthy diet with lots of vegetables, fruits and fiber.  "Colorful" foods have a lot of vitamins (ie green vegetables, tomatoes, red peppers, etc).  Limit sweet tea, regular sodas and alcoholic beverages, all of which has a lot of calories and sugar.  Up to 1 alcoholic drink daily may be beneficial for women (unless trying to lose weight, watch sugars).  Drink a lot of water.  Calcium recommendations are 1200-1500 mg daily (1500 mg for postmenopausal women or women without ovaries), and vitamin D 1000 IU daily.  This should be obtained from diet and/or supplements (vitamins), and calcium should not be taken all at once, but in divided doses.  Monthly self breast exams and yearly mammograms for women over the age of 71 is recommended.  Sunscreen of at least SPF 30 should be used on all sun-exposed parts of the skin when outside between the hours of 10 am and 4 pm (not just when at beach or pool, but even with exercise, golf, tennis, and yard work!)  Use a sunscreen that says "broad spectrum" so it covers both UVA and UVB rays, and make sure to reapply every 1-2 hours.  Remember to change the batteries in your smoke detectors when changing your clock times in the spring and fall.  Use your seat belt every time you are in a car, and please drive safely and not be distracted with cell phones and texting while driving.   Kristin Willis , Thank you for taking time to come for your Medicare Wellness Visit. I appreciate your ongoing commitment to your health goals. Please review the following  plan we discussed and let me know if I can assist you in the future.    This is a list of the screening recommended for you and due dates:  Health Maintenance  Topic Date Due  . Colon Cancer Screening  02/02/2019  . Hemoglobin A1C  04/04/2019  . Flu Shot  04/16/2019  . Complete foot exam   10/05/2019  . Eye exam for diabetics  10/12/2019  . Tetanus Vaccine  01/03/2027  . DEXA scan (bone density measurement)  Completed  . Pneumonia vaccines  Completed   I believe your gastroenterologist said another colonoscopy wouldn't be needed (due to age), unless having problems.  Your next bone density test will be due 09/2019 (2 year follow-up)  A1c was done with your labs today.  Please try and get [name] a copy at your convenience of the living will and healthcare power of attorney so we can scan it into your chart.

## 2019-05-11 NOTE — Progress Notes (Signed)
Chief Complaint  Patient presents with  . Medicare Wellness    fasting AWV/CPE. Got HD flu and 2nd shingles shot(documented). Has been seeing chiro in [place]. [facility] to her home-she would like to do this. (she has paper with her)    Kristin Willis is a 85 y.o. female who presents for annual physical exam, Medicare wellness visit and follow-up on chronic medical conditions.  She has been having some sciatica, going to the chiropractor. It bothers her mostly at night--gets up at 5:30, can sit on ice, and then she is able to walk and feels better. Chiropractor was helpful, but is getting expensive, so stopped going.  Also had PT for balance, didn't feel like she made much progress, so she stopped that also. She denies any falls. She hasn't seen much of her family, but they are helping with the groceries. Got great news about her son--liver improved and he no longer needs a transplant. He has remained sober.  Osteoporosis:DEXA was done in1/2019,and showed significant worsening at Left hip, now osteoporosis. T-2.7 atL fem neck (significant decline from 2015, was -1.4); T-1.3 L wrist (was -1.1).She was started on Prolia injections in April, 2019,getting every 6 months, and is tolerating Prolia without side effects. Last injection was 12/2018. She is taking Citracal (no longer the petites), takes 2 pills once daily, and drinks 1 glass of milk and has cheese.  She is also taking Vitamin D3 1000 IU daily. She continues to take MVI  (contains 400 IU). Vitamin D level was 45.4 in March 2019,  Hypertension: She reports compliance with taking amlodipine, carvedilol, HCTZ(and klorcon),andlosartan. Denies any lightheadedness, dizziness, chest pain, palpitations, edema (occasionally at the ankles only, relieved by elevation). Blood pressure has been running 96-157/48-80, mostly 105-115/50's-60. She occasionally gets leg cramps, and uses mustard packet (once a week or  less).  Diabetes: Controlled by diet, she has never taken medications. Sugar are running 84-123, mostly 90's. Lab Results  Component Value Date   HGBA1C 5.6 10/04/2018   Shehadbeen on the Next 56 Day diet, lost 20#. She had regained a little weight when her family had moved in with her and she wasn't able to go to the meetings.  She no longer does the program (was too expensive for the meetings).  Has 1 main meal a day, and does a meal supplement shake daily, and snacks. She feels somewhat hungry, "just sitting around all day". Sometimes will "snack on the wrong things". She hasn't been able to exercise, just housework. (previously did water aerobics).  Lastdiabeticeye exam was 09/2018, showed retinopathy.  Denies polydipsia (some dry mouth from meds, no changes), polyuria (drinks a lot, no changes,and has leakage). She has numbness in her left foot since her minor stroke, unchanged. She denies any burning or sores on her feet.  OAB--She had been going toDr. McDiarmid's officemonthly for transcutaneous nerve stimulationtreatments (sees him just 1-2x/year). She hasn't gone in the last two months, because the office was too crowded. She wears pads all the time, and special underwear at night, getting up2-3x/night. She continues on Vesicare from urologist. Continues to have bladder prolapse, and pessaries did not work (fall out when she stands, tried various kinds).  Hyperlipidemia-- Sheis takinga fish oil capsule that she found that does not have red dye, 1075m, taking 1 daily.Sometimes this repeats on her. Trying to follow a low fat diet. She limits her carbs, sweets, fried foods. Snacks on cheese. Compliant with Zetia.She is fasting today. Lab  Results  Component Value Date   CHOL 147 10/04/2018   HDL 40 10/04/2018   LDLCALC 75 10/04/2018   TRIG 160 (H) 10/04/2018   CHOLHDL 3.7 10/04/2018   Memory concerns:She has been taking Prevagen forat least4 years to help with  memory, and she finds it effective. She notices improvement--easier to recall names. She is very busy, but keeps up with everything by keeping an organized, detailed calendar.Denies any memory concerns--doesn't lose keys, car, get lost driving.Just needs to write things down more. She denies any change in the last year.  H/o CVA, on Plavix. She has residual numbness of L foot. Also felt to have a mild peripheral neuropathy, possibly related to her diabetes (diet controlled).She remains on gabapentin (started by Kristin Willis in the past). She denies any pain in her foot, just a mild tingling/numbness. She remains onPlavix. Denies any bleeding or side effects(some bruising on hands, forearms). She sees Kristin Willis once yearly, last seen in November 2019, but reports he retired.  Gout: She hasn't had a flare in many years. She previously took allopurinol,, but hasn't had any flares since stopping it. Last check of uric acid had been OFF the allopurinol, and remained normal. 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 She continues to be in remissionper her last report. Doing well.Yearly u/a to be done by PCP to look for protein, per pt at previous visit.She reports she has an upcoming appt, prior to her retiring. She occasionally has some burning when she voids, changing the pads more often helps.  Does not feel like she has an infection. She has powder on today.  Immunization History  Administered Date(s) Administered  . Influenza Split 06/14/2012, 06/09/2013, 05/30/2014  . Influenza Whole 07/06/2002, 06/28/2008, 06/25/2011  . Influenza, High Dose Seasonal PF 05/11/2017, 07/06/2018, 05/03/2019  . Influenza-Unspecified 05/31/2015, 05/27/2016  . Pneumococcal Conjugate-13 01/09/2014  . Pneumococcal Polysaccharide-23 04/05/2007, 02/16/2017  . Tdap 04/05/2007, 01/02/2017  . Zoster  09/15/2004  . Zoster Recombinat (Shingrix) 07/06/2018, 05/03/2019   Last Pap smear:11/2017(h/o endometrial CA)--sees Kristin Willis Last mammogram:05/2018, scheduled for 05/2019 Last colonoscopy:01/2014--tubular adenoma. F/u not recommended due to age, per Dr. Cristina Gong Last DEXA:09/2017 T-2.7 atL fem neck (significant decline from 2015) Dentist: sees Dr. Essie Hart every 4 months and reports they do all of her cleanings, x-rays, etc. Ophtho:Dr. Rankin yearly Exercise: Very limited, just housework (previous routine included water aerobics for 1 hour 2x/week.) Vit D-OH 38 in 07/2015, 45.4 in 12/2017.  Other doctors caring for patient include: GYN:Kristin Willis Nephro: Kristin Willis  Neuro: Kristin Willis (retiring) Urology: Kristin Willis Ortho: Dr. Tamera Punt ([facility]) Ophtho: Dr. Zadie Rhine Dentist:Dr. Jone Baseman (just twice), and sees Dr. Essie Hart (periodontist) GI: Dr. Cristina Gong Derm: Dr. Michele Mcalpine (in [place]) Wound doctor: Dr. Dellia Nims (healed, no longer sees) Podiatry: Dr. March Rummage  Depression screen:Negative Falls screen: None Functional status survey: notable forleakage of urine. Has hearing aids (doesn't use). Memory is stable. Doesn't like stairs--afraid of falling. Diarrhea with certain foods  Mini-Cog screen: normal (5)  See Epic for full questionnaires  End of Life Discussion: Patient hasa living will and medical power of attorney.  Past Medical History:  Diagnosis Date  . Abnormality of gait 07/21/2016  . Arthritis    right knee, left wrist  . Asthma    related to allergies when working; resolved  . Breast cancer ([place]) 7/08 Inrasine ductal L breast  . Colon polyps  4/09--adenomatous. 04/2010--normal.  Repeat 2014  . DDD (degenerative disc disease), lumbar 4/06  . Diabetes mellitus    diet controlled  . Diabetic retinopathy   . DJD (degenerative joint disease)   . Dyslipidemia   . Endometrial cancer ([facility]) hx  . Gout   . Hepatitis A history(in [place]-1950's)  . History of  stroke 10/24/2013  . Hypertension   . Incontinence   . Membranous glomerulonephropathy    Kristin Willis  . Mini stroke [facility]) DrWillis  . Obesity, unspecified   . Osteopenia L hip, 4/06  . 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 Skin cancer    [name]  . Sliding hiatal hernia small sliding hiatal hernia  . Wears glasses     Past Surgical History:  Procedure Laterality Date  . ABDOMINAL HYSTERECTOMY     endometrial cancer  . APPENDECTOMY    . BREAST LUMPECTOMY Left 8/08 L Breast ([name])  . CATARACT EXTRACTION    . COLONOSCOPY  8/08 [name] (normal)  . kidney stones    . left middle finger    . REPLACEMENT TOTAL KNEE  Left  . SKIN CANCER EXCISION    . TOTAL KNEE ARTHROPLASTY     left    Social History   Socioeconomic History  . Marital status: Married    Spouse name: Not on file  . Number of children: 3  . Years of education: 42  . Highest education level: Not on file  Occupational History  . Occupation: retired  Scientific laboratory technician  . Financial resource strain: Not on file  . Food insecurity    Worry: Not on file    Inability: Not on file  . Transportation needs    Medical: Not on file    Non-medical: Not on file  Tobacco Use  . Smoking status: Never Smoker  . Smokeless tobacco: Never Used  Substance and Sexual Activity  . Alcohol use: No  . Drug use: No  . Sexual activity: Not Currently  Lifestyle  . Physical activity    Days per week: Not on file    Minutes per session: Not on file  . Stress: Not on file  Relationships  . Social Herbalist on phone: Not on file    Gets together: Not on file    Attends religious service: Not on file    Active member of club or organization: Not on file    Attends meetings of clubs or organizations: Not on file    Relationship status: Not on file  . Intimate partner violence    Fear of current or ex partner: Not on file    Emotionally abused: Not on file    Physically abused: Not on file     Forced sexual activity: Not on file  Other Topics Concern  . Not on file  Social History Narrative   1 daughter lives in [place], son in [place], [place].  Widowed (husband was in [place])   Son is an alcoholic (in rehab 10/8313), doing well, liver failure resolved (at one point thought he needed a transplant).      Patient drinks 2 cups of tea daily, not daily   Patient is right handed.     Family History  Problem Relation Age of Onset  . Heart attack Father   . Diabetes Father   . Heart attack Mother   . Hypertension Mother   . Diabetes Mother   . Hypertension Brother   . Stroke  Brother   . Melanoma Brother   . Diabetes Brother   . Hypertension Son   . Alcohol abuse Son   . Stroke Paternal Grandmother   . Diabetes Paternal Grandfather   . Vision loss Paternal Grandfather        related to diabetes  . Cancer Neg Hx     Outpatient Encounter Medications as of 05/12/2019  Medication Sig Note  . Acetaminophen (TYLENOL ARTHRITIS PAIN PO) Take 2 tablets by mouth 2 (two) times daily.    Marland Kitchen amLODipine (NORVASC) 5 MG tablet Take 1 tablet (5 mg total) by mouth daily.   Marland Kitchen Apoaequorin (PREVAGEN PO) Take 1 tablet by mouth daily.   . Blood Glucose Monitoring Suppl (ACCU-CHEK GUIDE) w/Device KIT 1 each by Does not apply route 2 (two) times daily.   . Calcium Carbonate-Vitamin D (CALTRATE 600+D PO) Take 2 tablets by mouth daily.    . carvedilol (COREG) 25 MG tablet TAKE 1 & 1/2 TABLETS BY MOUTH TWICE A DAY   . cholecalciferol (VITAMIN D) 1000 units tablet Take 1,000 Units by mouth daily.   . clopidogrel (PLAVIX) 75 MG tablet Take 1 tablet (75 mg total) by mouth daily.   Marland Kitchen ezetimibe (ZETIA) 10 MG tablet Take 1 tablet (10 mg total) by mouth daily.   Marland Kitchen FREESTYLE LITE test strip TEST BLOOD SUGAR TWICE DAILY   . gabapentin (NEURONTIN) 300 MG capsule TAKE 1 CAPSULE BY MOUTH EVERY NIGHT AT BEDTIME   . hydrochlorothiazide (HYDRODIURIL) 12.5 MG tablet Take 1 tablet (12.5 mg total) by mouth  daily.   Marland Kitchen LANCETS MICRO THIN 33G MISC 1 each by Does not apply route 2 (two) times daily.   Marland Kitchen losartan (COZAAR) 100 MG tablet Take 1 tablet (100 mg total) by mouth daily.   . Misc Natural Products (OSTEO BI-FLEX ADV TRIPLE ST PO) Take 2 capsules by mouth daily.   . Multiple Vitamin (MULTIVITAMIN) tablet Take 1 tablet by mouth daily.   . Omega-3 Fatty Acids (FISH OIL) 1000 MG CAPS Take 1 capsule by mouth 2 (two) times daily. 12/07/2017: Reservatrol 2 daily-omega plus  . potassium chloride (KLOR-CON M10) 10 MEQ tablet Take 1 tablet (10 mEq total) by mouth daily.   . solifenacin (VESICARE) 5 MG tablet Take 5 mg by mouth daily.   Marland Kitchen Specialty Vitamins Products ([facility] HAIR/SKIN/NAILS PO) Take 2 each by mouth daily.   . cephALEXin (KEFLEX) 500 MG capsule Take 1 tablet prior to dental procedure, take second tablet evening after dental procedure (Patient not taking: Reported on 12/07/2017)   . Coenzyme Q10 (CO Q 10 PO) Take 1 tablet by mouth daily.    . [DISCONTINUED] ciprofloxacin (CIPRO) 500 MG tablet Take 500 mg by mouth 3 (three) times daily. 10/04/2018: She was given about 2 week supply, has been taking x 1 week.  Unsure of the mg dose, but IS taking TID  . [DISCONTINUED] FLUZONE HIGH-DOSE QUADRIVALENT 0.7 ML SUSY TO BE ADMINISTERED BY PHARMACIST FOR IMMUNIZATION   . [DISCONTINUED] potassium chloride (K-DUR) 10 MEQ tablet TAKE 1 TABLET BY MOUTH EVERY DAY    No facility-administered encounter medications on file as of 05/12/2019.     Allergies  Allergen Reactions  . Ciprofloxacin Nausea And Vomiting  . Codeine Nausea And Vomiting  . Contrast Media [Iodinated Diagnostic Agents] Nausea And Vomiting  . Morphine And Related Nausea And Vomiting  . Statins Other (See Comments)    lethargic  . Toprol Xl [Metoprolol Tartrate]     Symptoms of heart attack,  per pt, told not to take it again (?chest pain)  . Clindamycin/Lincomycin Itching and Rash  . Penicillins Hives, Swelling and Rash    Has patient had a  PCN reaction causing immediate rash, facial/tongue/throat swelling, SOB or lightheadedness with hypotension: no Has patient had a PCN reaction causing severe rash ivolving mucus membranes or skin necrosis: no Has patient had a PCN reaction that required hospitalization no Has patient had a PCN reaction occurring within the last 10 years: no If all of the above answers are "NO", then may proceed with Cephalosporin use.  . Prednisone Palpitations    ROS:  The patient denies anorexia, fever, headaches,  vision changes, ear pain, sore throat, breast concerns, chest pain, palpitations, dizziness, syncope, dyspnea on exertion, cough, swelling, nausea, vomiting, constipation, abdominal pain, melena, hematochezia, indigestion/heartburn, hematuria, dysuria, vaginal bleeding, discharge, odor or itch, genital lesions, numbness (just residual from stroke), tingling, weakness, tremor, suspicious skin lesions, depression, anxiety, abnormal bleeding/bruising (just bruising of hands/forearms), or enlarged lymph nodes. Incontinence (wears pads, overall controlled with medication and nerve treatments) Frozen right shoulder, limited range of motion, but not painful. +sciatica, doesn't last all day, icing when she wakes up helps a lot. R knee pain/arthritis. Diarrhea after onions, nuts or other certain foods.   PHYSICAL EXAM:  BP 120/66   Pulse 64   Temp 98.2 F (36.8 C) (Other (Comment))   Ht _0  (1.651 m)   Wt 196 lb 12.8 oz (89.3 kg)   BMI 32.75 kg/m   Wt Readings from Last 3 Encounters:  05/12/19 196 lb 12.8 oz (89.3 kg)  10/04/18 198 lb 12.8 oz (90.2 kg)  08/03/18 195 lb (88.5 kg)    General Appearance:    Alert, cooperative, no distress, appears stated age  Head:    Normocephalic, without obvious abnormality, atraumatic  Eyes:    PERRL, conjunctiva/corneas clear, EOM's intact, fundi    benign  Ears:    Normal TM's and external ear canals  Nose:   Not examined (wearing mask due to COVID-19  pandemic)  Throat:   Not examined (wearing mask due to COVID-19 pandemic)  Neck:   Supple, no lymphadenopathy;  thyroid:  no enlargement/ tenderness/nodules; no carotid bruit or JVD  Back:    Spine nontender, no curvature, ROM normal, no CVA  tenderness. nontender at SI joints, sciatic notch. Negative SLR.  Lungs:     Clear to auscultation bilaterally without wheezes, rales or   ronchi; respirations unlabored  Chest Wall:    No tenderness or deformity   Heart:    Regular rate and rhythm, S1 and S2 normal, no murmur, rub   or gallop  Breast Exam:    Deferred to GYN  Abdomen:     Soft, non-tender, nondistended, normoactive bowel sounds,    no masses, no hepatosplenomegaly. Easily reducible umbilical hernia is present.  Genitalia:    Deferred to GYN     Extremities:   No clubbing, cyanosis or edema.  Pulses:   2+ and symmetric all extremities  Skin:   Skin color, texture, turgor normal, no rashes or lesions. Purpuric lesions and bruising noted on lower legs and forearms. Lower legs are very dry/flaky  Lymph nodes:   Cervical, supraclavicular nodes normal  Neurologic:   CNII-XII intact, normal strength, sensation and gait; reflexes 2+ and symmetric throughout  Psych:   Normal mood, affect, hygiene and grooming.    Urine dip: negative for blood protein.  +nitrite, 3+ leuks (asymptomatic).  ASSESSMENT/PLAN:   Patient will be enrolling in [facility] multidose program--will mail meds to her pre-packaged. She doesn't need any refills right now.  Discussed monthly self breast exams and yearly mammograms; at least 30 minutes of aerobic activity at least 5 days/week and weight-bearing exercise 2x/week; proper sunscreen use reviewed; healthy diet, including goals of calcium and vitamin D intake and alcohol recommendations (less than or equal to 1 drink/day) reviewed; regular seatbelt use; changing batteries in smoke detectors.  Immunization recommendations discussed--continue  yearly high dose flu shots. Colonoscopy recommendations reviewed, due again 2020 based on pathology from polyp, but not needed due to age, since not having any GI problems. DEXA will be due after 09/2019--can order at her f/u visit.    Pt asked to get [name] copies of her living will and healthcare POA. Full Code, Full care MOST form discussed and completed.   Medicare Attestation I have personally reviewed: The patient's medical and social history Their use of alcohol, tobacco or illicit drugs Their current medications and supplements The patient's functional ability including ADLs,fall risks, home safety risks, cognitive, and hearing and visual impairment Diet and physical activities Evidence for depression or mood disorders  The patient's weight, height, BMI, and visual acuity have been recorded in the chart.  I have made referrals, counseling, and provided education to the patient based on review of the above and I have provided the patient with a written personalized care plan for preventive services.

## 2019-05-12 ENCOUNTER — Encounter: Payer: Self-pay | Admitting: Family Medicine

## 2019-05-12 ENCOUNTER — Ambulatory Visit: Payer: Medicare Other | Admitting: Family Medicine

## 2019-05-12 ENCOUNTER — Other Ambulatory Visit: Payer: Self-pay

## 2019-05-12 VITALS — BP 120/66 | HR 64 | Temp 98.2°F | Ht 65.0 in | Wt 196.8 lb

## 2019-05-12 DIAGNOSIS — D692 Other nonthrombocytopenic purpura: Secondary | ICD-10-CM

## 2019-05-12 DIAGNOSIS — Z87448 Personal history of other diseases of urinary system: Secondary | ICD-10-CM

## 2019-05-12 DIAGNOSIS — M810 Age-related osteoporosis without current pathological fracture: Secondary | ICD-10-CM | POA: Diagnosis not present

## 2019-05-12 DIAGNOSIS — Z5181 Encounter for therapeutic drug level monitoring: Secondary | ICD-10-CM

## 2019-05-12 DIAGNOSIS — I10 Essential (primary) hypertension: Secondary | ICD-10-CM

## 2019-05-12 DIAGNOSIS — E782 Mixed hyperlipidemia: Secondary | ICD-10-CM | POA: Diagnosis not present

## 2019-05-12 DIAGNOSIS — Z0000 Encounter for general adult medical examination without abnormal findings: Secondary | ICD-10-CM | POA: Diagnosis not present

## 2019-05-12 DIAGNOSIS — E11319 Type 2 diabetes mellitus with unspecified diabetic retinopathy without macular edema: Secondary | ICD-10-CM | POA: Diagnosis not present

## 2019-05-12 DIAGNOSIS — E113299 Type 2 diabetes mellitus with mild nonproliferative diabetic retinopathy without macular edema, unspecified eye: Secondary | ICD-10-CM

## 2019-05-12 LAB — POCT URINALYSIS DIP (PROADVANTAGE DEVICE)
Bilirubin, UA: NEGATIVE
Blood, UA: NEGATIVE
Glucose, UA: NEGATIVE mg/dL
Ketones, POC UA: NEGATIVE mg/dL
Nitrite, UA: POSITIVE — AB
Protein Ur, POC: NEGATIVE mg/dL
Specific Gravity, Urine: 1.015
Urobilinogen, Ur: NEGATIVE
pH, UA: 5.5 (ref 5.0–8.0)

## 2019-05-13 ENCOUNTER — Encounter: Payer: Self-pay | Admitting: Family Medicine

## 2019-05-13 LAB — COMPREHENSIVE METABOLIC PANEL
ALT: 12 IU/L (ref 0–32)
AST: 16 IU/L (ref 0–40)
Albumin/Globulin Ratio: 1.4 (ref 1.2–2.2)
Albumin: 4.1 g/dL (ref 3.6–4.6)
Alkaline Phosphatase: 36 IU/L — ABNORMAL LOW (ref 39–117)
BUN/Creatinine Ratio: 26 (ref 12–28)
BUN: 26 mg/dL (ref 8–27)
Bilirubin Total: 0.7 mg/dL (ref 0.0–1.2)
CO2: 26 mmol/L (ref 20–29)
Calcium: 10 mg/dL (ref 8.7–10.3)
Chloride: 99 mmol/L (ref 96–106)
Creatinine, Ser: 0.99 mg/dL (ref 0.57–1.00)
GFR calc Af Amer: 59 mL/min/{1.73_m2} — ABNORMAL LOW (ref >59)
GFR calc non Af Amer: 51 mL/min/{1.73_m2} — ABNORMAL LOW (ref >59)
Globulin, Total: 3 g/dL (ref 1.5–4.5)
Glucose: 114 mg/dL — ABNORMAL HIGH (ref 65–99)
Potassium: 4.1 mmol/L (ref 3.5–5.2)
Sodium: 140 mmol/L (ref 134–144)
Total Protein: 7.1 g/dL (ref 6.0–8.5)

## 2019-05-13 LAB — LIPID PANEL
Chol/HDL Ratio: 4.4 ratio (ref 0.0–4.4)
Cholesterol, Total: 153 mg/dL (ref 100–199)
HDL: 35 mg/dL — ABNORMAL LOW (ref >39)
LDL Calculated: 71 mg/dL (ref 0–99)
Triglycerides: 233 mg/dL — ABNORMAL HIGH (ref 0–149)
VLDL Cholesterol Cal: 47 mg/dL — ABNORMAL HIGH (ref 5–40)

## 2019-05-13 LAB — HEMOGLOBIN A1C
Est. average glucose Bld gHb Est-mCnc: 114 mg/dL
Hgb A1c MFr Bld: 5.6 % (ref 4.8–5.6)

## 2019-05-24 ENCOUNTER — Other Ambulatory Visit: Payer: Self-pay | Admitting: Family Medicine

## 2019-05-24 DIAGNOSIS — I10 Essential (primary) hypertension: Secondary | ICD-10-CM

## 2019-05-31 ENCOUNTER — Other Ambulatory Visit: Payer: Self-pay | Admitting: Family Medicine

## 2019-05-31 NOTE — Telephone Encounter (Signed)
This was filled for #90 on 8/13, and only takes 1/d. Shouldn't need refill

## 2019-05-31 NOTE — Telephone Encounter (Signed)
Is this ok to refill?  

## 2019-06-01 NOTE — Telephone Encounter (Signed)
Spoke with patient and she thought she needed it-she checked and they gave it to her in multiple bottles so she is ok and does not need. Denied.

## 2019-06-07 ENCOUNTER — Other Ambulatory Visit: Payer: Self-pay

## 2019-06-07 ENCOUNTER — Ambulatory Visit
Admission: RE | Admit: 2019-06-07 | Discharge: 2019-06-07 | Disposition: A | Discharge status: Home / Self Care | Payer: Medicare Other | Source: Ambulatory Visit | Attending: Family Medicine | Admitting: Family Medicine

## 2019-06-07 DIAGNOSIS — Z1231 Encounter for screening mammogram for malignant neoplasm of breast: Secondary | ICD-10-CM

## 2019-06-10 ENCOUNTER — Other Ambulatory Visit: Payer: Self-pay | Admitting: Family Medicine

## 2019-06-10 DIAGNOSIS — I10 Essential (primary) hypertension: Secondary | ICD-10-CM

## 2019-06-15 ENCOUNTER — Other Ambulatory Visit: Payer: Self-pay | Admitting: Family Medicine

## 2019-06-15 DIAGNOSIS — I10 Essential (primary) hypertension: Secondary | ICD-10-CM

## 2019-06-29 ENCOUNTER — Other Ambulatory Visit: Payer: Medicare Other

## 2019-07-06 ENCOUNTER — Other Ambulatory Visit: Payer: Self-pay

## 2019-07-06 ENCOUNTER — Other Ambulatory Visit: Payer: Medicare Other

## 2019-07-06 DIAGNOSIS — M810 Age-related osteoporosis without current pathological fracture: Secondary | ICD-10-CM

## 2019-07-06 MED ORDER — DENOSUMAB 60 MG/ML ~~LOC~~ SOSY
60.0000 mg | PREFILLED_SYRINGE | Freq: Once | SUBCUTANEOUS | Status: AC
  Administered 2019-07-06: 14:00:00 60 mg via SUBCUTANEOUS

## 2019-08-29 ENCOUNTER — Other Ambulatory Visit: Payer: Self-pay | Admitting: Family Medicine

## 2019-08-30 NOTE — Telephone Encounter (Signed)
Is this okay to refill? 

## 2019-08-31 LAB — HM PAP SMEAR: HM Pap smear: NORMAL

## 2019-09-11 ENCOUNTER — Other Ambulatory Visit: Payer: Self-pay | Admitting: Family Medicine

## 2019-09-11 DIAGNOSIS — I10 Essential (primary) hypertension: Secondary | ICD-10-CM

## 2019-09-18 ENCOUNTER — Other Ambulatory Visit: Payer: Self-pay | Admitting: Family Medicine

## 2019-09-18 DIAGNOSIS — I10 Essential (primary) hypertension: Secondary | ICD-10-CM

## 2019-09-22 ENCOUNTER — Other Ambulatory Visit: Payer: Self-pay | Admitting: Family Medicine

## 2019-09-22 DIAGNOSIS — I10 Essential (primary) hypertension: Secondary | ICD-10-CM

## 2019-09-25 ENCOUNTER — Other Ambulatory Visit: Payer: Self-pay | Admitting: Family Medicine

## 2019-09-25 DIAGNOSIS — I10 Essential (primary) hypertension: Secondary | ICD-10-CM

## 2019-09-26 ENCOUNTER — Other Ambulatory Visit: Payer: Self-pay | Admitting: Neurology

## 2019-10-03 ENCOUNTER — Other Ambulatory Visit: Payer: Self-pay | Admitting: Family Medicine

## 2019-10-03 DIAGNOSIS — E7800 Pure hypercholesterolemia, unspecified: Secondary | ICD-10-CM

## 2019-10-13 ENCOUNTER — Other Ambulatory Visit: Payer: Self-pay | Admitting: Neurology

## 2019-11-04 ENCOUNTER — Other Ambulatory Visit: Payer: Self-pay | Admitting: Family Medicine

## 2019-11-04 DIAGNOSIS — I10 Essential (primary) hypertension: Secondary | ICD-10-CM

## 2019-11-07 ENCOUNTER — Encounter: Payer: Self-pay | Admitting: *Deleted

## 2019-11-13 NOTE — Progress Notes (Signed)
Chief Complaint  Patient presents with  . Diabetes    med check, nonfasting. Right lower leg is swollen and itching over the last month. Elevating doesn't help. Dr.Willis her neurolgist retired and he was rx-ing her plavix and did not turn over to anyone else's care-so she has taken in over a month and not sure what to do.     Patient presents for 6 month follow-up.  Complains of RLE swelling and some itching. This has been going on for about a month.  Denies any pain.  She has one area that is scabbed where it had been itchy. It isn't itchy currently.  She tried elevating leg, but it doesn't help much.  Hasn't tried compression sock. She reports eating more soups lately (takeout from restaurants), as a way to get to eat more vegetables.  She reports burning with urination, but is intermittent, though occurring more often than not.  She is concerned she has another UTI.  She has previously discussed these symptoms with her GYN, who "gave her something" which helped but it ran out.  She doesn't recall the treatment.  She reports she gets up 3x/night to void, and has bad sciatica.  She ices her L lower back every morning when she gets up at [place] was last done in1/2019,and showed significant worsening at Left hip, osteoporosis. T-2.7 atL fem neck (significant decline from 2015, was -1.4); T-1.3 L wrist (was -1.1).Shewas started onProlia injectionsin April, 2019, getting every 6 months, andistolerating Proliawithout side effects. Last injection was 06/2019.  She is due for recheck of DEXA scan.   She continues to take Calcium with D,and drinks 1 glass of milk and has cheese. She is also takingVitamin D3 1000 IU daily. She continues to take MVI(contains 400 IU). She had a normal Vitamin D level in 11/2017 (45.4).  Hypertension: She reports compliance with taking amlodipine, carvedilol, HCTZ(and klorcon),andlosartan. Denies any lightheadedness, dizziness, chest  pain, palpitations. She has noted some swelling in the RLE as reported above.She gets an occasional leg cramp (relieved by mustard packet), every couple of weeks. Blood pressure has been running 124-140/56-78. Had one day where BP was elevated at 153/89 (and repeat was higher), on 2/24.  No known pain or reason.  Also 2/28 was up at 140/70.   As reported above, she has been eating more soups from restaurants, with saltines with the soup.  Diabetes: Controlled by diet, she has never taken medications. Sugar are running  Lab Results  Component Value Date   HGBA1C 5.6 05/12/2019   Sheno longer does the Next 56 Day diet, due to cost. She only eats 1 meal daily, and not many snacks.  Cheese and toast for breakfast sometimes, not daily. Sugars are 90's fasting.  Once checked after meal and it was 138.  Lastdiabeticeye exam was 09/2018, showed retinopathy.She had to cancel her recent appointment and hasn't rescheduled it yet. Denies polydipsia, polyuria. She has numbnessin her left foot since her minor stroke, unchanged. She denies any burning or sores on her feet.  OAB--She had been going toDr. McDiarmid's officemonthly for transcutaneous nerve stimulationtreatments (sees him just 1-2x/year). She had stopped going because the office was too crowded. "My children won't let me go". She continues on Vesicare from urologist. She started taking Ultra-nol total bladder support, which she thinks helps. She isn't sure if she wants to restart the nerve stimulation treatment. Continues to have bladder prolapse (pessaries didn't work well, would fall out).  Hyperlipidemia-- Sheis takinga fish oil  capsule that she found that does not have red dye, 1034m, taking 2 daily (increased from 1 daily after last labs).Trying to follow a low fat diet. She limits her carbs, sweets, fried foods. Snacks on cheese. Compliant with Zetia. She is not fasting today. TG were elevated on last check: Lab Results   Component Value Date   CHOL 153 05/12/2019   HDL 35 (L) 05/12/2019   LDLCALC 71 05/12/2019   TRIG 233 (H) 05/12/2019   CHOLHDL 4.4 05/12/2019   Memory concerns:She continues to take Prevagen, and she finds it effective. She notices improvement--easier to recall names. She denies any change in the last year.  H/o CVA, on Plavix. She ran out of the plavix about a month ago, when her neurologist refused the refill (pharmacy never sent request to PCP as they requested). She has residual numbness of L foot. Also felt to have a mild peripheral neuropathy, possibly related to her diabetes (diet controlled).She remains on gabapentin (started by Dr. MJana Hakimin the past). She denies any pain in her foot, just a mild tingling/numbness, unchanged.  Denies any bleeding or side effectswhen taking the Plavix.  She used to see Dr. WJannifer Franklinonce yearly,last seen in November 2019. Review of chart shows that rx refill went to Dr. WJannifer Franklin office last month, and was denied, asked for pharmacy to send to PCP. We did not get a refill request. She still has some bruising on her hands, despite being off of the plavix for a month.  Gout: She hasn't had a flare in many years. She previously took allopurinol,, but hasn't had any flares since stopping it. Last check of uric acid had been OFF the allopurinol, and remained normal. Lab Results  Component Value Date   LABURIC 4.8 02/16/2017    PMH, PSH, SH reviewed  Outpatient Encounter Medications as of 11/14/2019  Medication Sig Note  . Acetaminophen (TYLENOL ARTHRITIS PAIN PO) Take 2 tablets by mouth 2 (two) times daily.    .Marland KitchenamLODipine (NORVASC) 5 MG tablet TAKE 1 TABLET BY MOUTH EVERY DAY   . Apoaequorin (PREVAGEN PO) Take 1 tablet by mouth daily.   . Ascorbic Acid (VITAMIN C) 500 MG CAPS Take 1 tablet by mouth daily.   . Blood Glucose Monitoring Suppl (ACCU-CHEK GUIDE) w/Device KIT 1 each by Does not apply route 2 (two) times daily.   . Calcium  Carbonate-Vitamin D (CALTRATE 600+D PO) Take 2 tablets by mouth daily.    . carvedilol (COREG) 25 MG tablet TAKE 1 & 1/2 TABLETS BY MOUTH TWICE A DAY   . cholecalciferol (VITAMIN D) 1000 units tablet Take 1,000 Units by mouth daily.   . Coenzyme Q10 (CO Q 10 PO) Take 1 tablet by mouth daily.    .Marland Kitchenezetimibe (ZETIA) 10 MG tablet TAKE 1 TABLET BY MOUTH EVERY DAY   . FREESTYLE LITE test strip TEST BLOOD SUGAR TWICE DAILY   . gabapentin (NEURONTIN) 300 MG capsule TAKE 1 CAPSULE BY MOUTH EVERY NIGHT AT BEDTIME   . hydrochlorothiazide (HYDRODIURIL) 12.5 MG tablet TAKE 1 TABLET BY MOUTH EVERY DAY   . KLOR-CON M10 10 MEQ tablet TAKE 1 TABLET BY MOUTH EVERY DAY   . losartan (COZAAR) 100 MG tablet TAKE 1 TABLET BY MOUTH EVERY DAY   . Misc Natural Products (OSTEO BI-FLEX ADV TRIPLE ST PO) Take 2 capsules by mouth daily.   . Multiple Vitamin (MULTIVITAMIN) tablet Take 1 tablet by mouth daily. 11/14/2019: For diabetics  . NON FORMULARY Take 1 capsule by  mouth at bedtime. 11/14/2019: Ultra-nol bladder support  . Omega-3 Fatty Acids (FISH OIL) 1000 MG CAPS Take 1 capsule by mouth 2 (two) times daily. 12/07/2017: Reservatrol 2 daily-omega plus  . solifenacin (VESICARE) 5 MG tablet Take 5 mg by mouth daily.   Marland Kitchen Specialty Vitamins Products ([employer] HAIR/SKIN/NAILS PO) Take 2 each by mouth daily.   . TURMERIC PO Take 1 tablet by mouth daily.   . Zinc 50 MG CAPS Take 1 capsule by mouth daily.   . cephALEXin (KEFLEX) 500 MG capsule Take 1 tablet prior to dental procedure, take second tablet evening after dental procedure (Patient not taking: Reported on 12/07/2017)   . clopidogrel (PLAVIX) 75 MG tablet Take 1 tablet (75 mg total) by mouth daily. (Patient not taking: Reported on 11/14/2019)   . LANCETS MICRO THIN 33G MISC 1 each by Does not apply route 2 (two) times daily.   . [DISCONTINUED] potassium chloride (K-DUR) 10 MEQ tablet TAKE 1 TABLET BY MOUTH EVERY DAY    No facility-administered encounter medications on file as of  11/14/2019.   Allergies  Allergen Reactions  . Ciprofloxacin Nausea And Vomiting  . Codeine Nausea And Vomiting  . Contrast Media [Iodinated Diagnostic Agents] Nausea And Vomiting  . Morphine And Related Nausea And Vomiting  . Statins Other (See Comments)    lethargic  . Toprol Xl [Metoprolol Tartrate]     Symptoms of heart attack, per pt, told not to take it again (?chest pain)  . Clindamycin/Lincomycin Itching and Rash  . Penicillins Hives, Swelling and Rash    Has patient had a PCN reaction causing immediate rash, facial/tongue/throat swelling, SOB or lightheadedness with hypotension: no Has patient had a PCN reaction causing severe rash ivolving mucus membranes or skin necrosis: no Has patient had a PCN reaction that required hospitalization no Has patient had a PCN reaction occurring within the last 10 years: no If all of the above answers are "NO", then may proceed with Cephalosporin use.  . Prednisone Palpitations   ROS: no fever, chills, URI symptoms, headaches, dizziness, chest pain, shortness of breath.  Denies GI complaints except as noted in HPI. Urinary complaints per HPI. Moods are good. Sometimes feels lonely, but isn't depressed.   PHYSICAL EXAM:  BP 138/60   Pulse 64   Temp (!) 97.5 F (36.4 C) (Other (Comment))   Ht '5\' 5"'$  (1.651 m)   Wt 197 lb (89.4 kg)   BMI 32.78 kg/m   Wt Readings from Last 3 Encounters:  11/14/19 197 lb (89.4 kg)  05/12/19 196 lb 12.8 oz (89.3 kg)  10/04/18 198 lb 12.8 oz (90.2 kg)   Pleasant, well-appearing female who looks great for her age.   HEENT: conjunctiva and sclera are clear, EOMI. Wearing mask Neck: no lymphadenopathy, thyromegaly or carotid bruit Heart: regular rate and rhythm Lungs: clear bilaterally, no wheezes, rales, ronchi Back: no spinal or CVA tenderness Abdomen: soft, nontender, no mass. No suprapubic tenderness Extremities: RLE 1-2+ pretibial edema. No edema on the left. There is some mild warmth and slightly  pink There is an area of hyperkeratosis (thickened and dry) at the inferior shin. There is a small scab at the upper medial lower leg, but no surrounding redness or swellin in this area. Negative Homan, no palpable cords Psych: normal mood, affect, hygiene and grooming.  She is in good spirits. Her son from [place] is currently visiting her (and has been sober for a year and a half and liver issues have resolved). Skin: as  noted above on LE's.  She has some purpura and ecchymosis on her hands and forearms.   Lab Results  Component Value Date   HGBA1C 5.2 11/14/2019     ASSESSMENT/PLAN:  Essential hypertension, benign - controlled; some recent fluctations.  Reviewed low sodium diet, intake of soup recently may contribute to edema and higher BP's - Plan: hydrochlorothiazide (HYDRODIURIL) 12.5 MG tablet, Comprehensive metabolic panel  Controlled type 2 diabetes mellitus with retinopathy, without long-term current use of insulin, macular edema presence unspecified, unspecified laterality, unspecified retinopathy severity ([place]) - Plan: HgB A1c, Comprehensive metabolic panel  Diet-controlled diabetes mellitus ([place]) - Plan: HgB A1c, TSH, Comprehensive metabolic panel, Microalbumin / creatinine urine ratio  Osteoporosis without current pathological fracture, unspecified osteoporosis type - Continue Prolia. Due for f/u DEXA, ordered and pt advised to schedule - Plan: DG Bone Density  Mixed hyperlipidemia - elevated TG on last check; she increased fish oil since then. Nonfasting today. Reviewed lowfat diet. 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  Restart. - Plan: clopidogrel (PLAVIX) 75 MG tablet  Dysuria - prev under the care of urologist. abnl urine dip, with some symptoms of dysuria. Will check  culture - Plan: POCT Urinalysis DIP (Proadvantage Device), Urine Culture, CANCELED: Urinalysis Dipstick  Medication monitoring encounter - Plan: CBC with Differential/Platelet, Comprehensive metabolic panel, Microalbumin / creatinine urine ratio, DG Bone Density  Venous stasis dermatitis of right lower extremity - discussed leg elevation, compression stockings, and use of TAC BID sparingly to affected areas prn - Plan: triamcinolone cream (KENALOG) 0.1 %, VAS [name] LOWER EXTREMITY VENOUS (DVT)  Edema of right lower leg - will check [name] to r/o DVT. Cannot r/o early cellulitis.  Discussed low Na intake, leg elevation. Contact us if increasing redness, pain. CBC   Schedule DM eye exam Restart plavix Doppler RLE to r/o DVT.  Off plavix x 1 month. Poss related to edema from increased sodium intake.  A1c c-met, urine microalb, TSH, cbc NONFASTING  F/u 6 mos CPE (fasting, or get labs prior) To f/u on LE swelling in the next 1-2 weeks if not resolving.  50+ min face to face visit, plus time for chart review and documentation.

## 2019-11-14 ENCOUNTER — Ambulatory Visit: Payer: Medicare PPO | Admitting: Family Medicine

## 2019-11-14 ENCOUNTER — Other Ambulatory Visit: Payer: Self-pay

## 2019-11-14 ENCOUNTER — Encounter: Payer: Self-pay | Admitting: Family Medicine

## 2019-11-14 ENCOUNTER — Other Ambulatory Visit: Payer: Self-pay | Admitting: *Deleted

## 2019-11-14 VITALS — BP 138/60 | HR 64 | Temp 97.5°F | Ht 65.0 in | Wt 197.0 lb

## 2019-11-14 DIAGNOSIS — R600 Localized edema: Secondary | ICD-10-CM

## 2019-11-14 DIAGNOSIS — Z87448 Personal history of other diseases of urinary system: Secondary | ICD-10-CM

## 2019-11-14 DIAGNOSIS — E113299 Type 2 diabetes mellitus with mild nonproliferative diabetic retinopathy without macular edema, unspecified eye: Secondary | ICD-10-CM

## 2019-11-14 DIAGNOSIS — E11319 Type 2 diabetes mellitus with unspecified diabetic retinopathy without macular edema: Secondary | ICD-10-CM | POA: Diagnosis not present

## 2019-11-14 DIAGNOSIS — R300 Dysuria: Secondary | ICD-10-CM | POA: Diagnosis not present

## 2019-11-14 DIAGNOSIS — E782 Mixed hyperlipidemia: Secondary | ICD-10-CM

## 2019-11-14 DIAGNOSIS — M810 Age-related osteoporosis without current pathological fracture: Secondary | ICD-10-CM

## 2019-11-14 DIAGNOSIS — N3090 Cystitis, unspecified without hematuria: Secondary | ICD-10-CM

## 2019-11-14 DIAGNOSIS — E119 Type 2 diabetes mellitus without complications: Secondary | ICD-10-CM | POA: Diagnosis not present

## 2019-11-14 DIAGNOSIS — I10 Essential (primary) hypertension: Secondary | ICD-10-CM

## 2019-11-14 DIAGNOSIS — Z5181 Encounter for therapeutic drug level monitoring: Secondary | ICD-10-CM

## 2019-11-14 DIAGNOSIS — I872 Venous insufficiency (chronic) (peripheral): Secondary | ICD-10-CM

## 2019-11-14 DIAGNOSIS — Z8673 Personal history of transient ischemic attack (TIA), and cerebral infarction without residual deficits: Secondary | ICD-10-CM

## 2019-11-14 LAB — POCT URINALYSIS DIP (PROADVANTAGE DEVICE)
Bilirubin, UA: NEGATIVE
Blood, UA: NEGATIVE
Glucose, UA: NEGATIVE mg/dL
Ketones, POC UA: NEGATIVE mg/dL
Nitrite, UA: POSITIVE — AB
Protein Ur, POC: NEGATIVE mg/dL
Specific Gravity, Urine: 1.02
Urobilinogen, Ur: NEGATIVE
pH, UA: 6 (ref 5.0–8.0)

## 2019-11-14 LAB — POCT GLYCOSYLATED HEMOGLOBIN (HGB A1C): Hemoglobin A1C: 5.2 % (ref 4.0–5.6)

## 2019-11-14 MED ORDER — CLOPIDOGREL BISULFATE 75 MG PO TABS: 75.0000 mg | ORAL_TABLET | Freq: Every day | ORAL | 3 refills | Status: DC

## 2019-11-14 MED ORDER — HYDROCHLOROTHIAZIDE 12.5 MG PO TABS: 12.5000 mg | ORAL_TABLET | Freq: Every day | ORAL | 1 refills | Status: DC

## 2019-11-14 MED ORDER — TRIAMCINOLONE ACETONIDE 0.1 % EX CREA: TOPICAL_CREAM | CUTANEOUS | 0 refills | Status: DC

## 2019-11-14 MED ORDER — LANCETS MICRO THIN 33G MISC: 1.0000 | Freq: Two times a day (BID) | 2 refills | Status: DC

## 2019-11-14 NOTE — Patient Instructions (Addendum)
Please cut back on the soups and saltines in your diet.  The extra sodium may be increasing your blood pressure and contributing to the swelling in your leg.  Try and keep the leg elevated above heart level when seated/reclined. Compression socks/stockings put on first thing in the morning (before they swell) might also be very helpful. You can use the prescription cream to the raised/rough itchy patches of skin.  Please reschedule your yearly diabetic eye exam.  [facility] to [facility] your follow-up bone density test, since starting the Prolia injections.  We are sending you to get an ultrasound of your right leg, to rule out a blood clot.

## 2019-11-15 ENCOUNTER — Ambulatory Visit (HOSPITAL_COMMUNITY): Payer: Self-pay

## 2019-11-15 ENCOUNTER — Ambulatory Visit (HOSPITAL_COMMUNITY)
Admission: RE | Admit: 2019-11-15 | Discharge: 2019-11-15 | Disposition: A | Discharge status: Home / Self Care | Payer: Medicare PPO | Source: Ambulatory Visit | Attending: Family Medicine | Admitting: Family Medicine

## 2019-11-15 DIAGNOSIS — I872 Venous insufficiency (chronic) (peripheral): Secondary | ICD-10-CM | POA: Diagnosis present

## 2019-11-15 LAB — CBC WITH DIFFERENTIAL/PLATELET
Basophils Absolute: 0.1 10*3/uL (ref 0.0–0.2)
Basos: 1 %
EOS (ABSOLUTE): 0.3 10*3/uL (ref 0.0–0.4)
Eos: 4 %
Hematocrit: 40.3 % (ref 34.0–46.6)
Hemoglobin: 13.6 g/dL (ref 11.1–15.9)
Immature Grans (Abs): 0 10*3/uL (ref 0.0–0.1)
Immature Granulocytes: 0 %
Lymphocytes Absolute: 2.8 10*3/uL (ref 0.7–3.1)
Lymphs: 42 %
MCH: 32.1 pg (ref 26.6–33.0)
MCHC: 33.7 g/dL (ref 31.5–35.7)
MCV: 95 fL (ref 79–97)
Monocytes Absolute: 0.6 10*3/uL (ref 0.1–0.9)
Monocytes: 9 %
Neutrophils Absolute: 2.9 10*3/uL (ref 1.4–7.0)
Neutrophils: 44 %
Platelets: 186 10*3/uL (ref 150–450)
RBC: 4.24 x10E6/uL (ref 3.77–5.28)
RDW: 12 % (ref 11.7–15.4)
WBC: 6.7 10*3/uL (ref 3.4–10.8)

## 2019-11-15 LAB — MICROALBUMIN / CREATININE URINE RATIO
Creatinine, Urine: 74.5 mg/dL
Microalb/Creat Ratio: 63 mg/g creat — ABNORMAL HIGH (ref 0–29)
Microalbumin, Urine: 46.9 ug/mL

## 2019-11-15 LAB — COMPREHENSIVE METABOLIC PANEL
ALT: 10 IU/L (ref 0–32)
AST: 19 IU/L (ref 0–40)
Albumin/Globulin Ratio: 1.5 (ref 1.2–2.2)
Albumin: 3.8 g/dL (ref 3.6–4.6)
Alkaline Phosphatase: 40 IU/L (ref 39–117)
BUN/Creatinine Ratio: 27 (ref 12–28)
BUN: 24 mg/dL (ref 8–27)
Bilirubin Total: 0.7 mg/dL (ref 0.0–1.2)
CO2: 27 mmol/L (ref 20–29)
Calcium: 9.5 mg/dL (ref 8.7–10.3)
Chloride: 101 mmol/L (ref 96–106)
Creatinine, Ser: 0.89 mg/dL (ref 0.57–1.00)
GFR calc Af Amer: 67 mL/min/{1.73_m2} (ref >59)
GFR calc non Af Amer: 58 mL/min/{1.73_m2} — ABNORMAL LOW (ref >59)
Globulin, Total: 2.5 g/dL (ref 1.5–4.5)
Glucose: 106 mg/dL — ABNORMAL HIGH (ref 65–99)
Potassium: 4.1 mmol/L (ref 3.5–5.2)
Sodium: 141 mmol/L (ref 134–144)
Total Protein: 6.3 g/dL (ref 6.0–8.5)

## 2019-11-15 LAB — TSH: TSH: 1.24 u[IU]/mL (ref 0.450–4.500)

## 2019-11-15 NOTE — Progress Notes (Signed)
Please advise pt that her ultrasound did not show any evidence of a blood clot.  Also let her know that her labs were normal (sugar was 106, but rest of chem, CBC and thyroid tests were normal). The urine she left did show some leukocytes, but since her discomfort is chronic, we are going to wait on the culture results before treating with antibiotic (to make sure we choose the right one that will work).

## 2019-11-15 NOTE — Progress Notes (Signed)
Lower extremity venous has been completed.   Preliminary results in [facility] Proc.   Abram Sander 11/15/2019 1:32 PM

## 2019-11-16 LAB — URINE CULTURE

## 2019-11-16 MED ORDER — NITROFURANTOIN MONOHYD MACRO 100 MG PO CAPS: 100.0000 mg | ORAL_CAPSULE | Freq: Two times a day (BID) | ORAL | 0 refills | Status: DC

## 2019-11-16 NOTE — Addendum Note (Signed)
Addended by: Rita Ohara on: 11/16/2019 04:23 PM   Modules accepted: Orders

## 2019-11-21 LAB — HM DIABETES EYE EXAM

## 2019-11-22 ENCOUNTER — Telehealth: Payer: Self-pay | Admitting: Family Medicine

## 2019-11-22 NOTE — Telephone Encounter (Signed)
Pt called and states that she saw her eye doctor, Dr. Zadie Rhine, yesterday. She will ask them to forward report. She also states that she scheduled a bone density. It is May 17. That is the earliest appt they had. Pt can be reached at [phone].

## 2019-11-28 ENCOUNTER — Other Ambulatory Visit: Payer: Self-pay | Admitting: Family Medicine

## 2019-11-29 NOTE — Telephone Encounter (Signed)
Is this okay to refill? 

## 2019-11-30 ENCOUNTER — Telehealth: Payer: Self-pay | Admitting: Family Medicine

## 2019-11-30 NOTE — Telephone Encounter (Signed)
Pt has new insurance requiring a prior auth for Prolia. Sending back to be completed. Pt's next injection is due in April.

## 2019-12-10 ENCOUNTER — Other Ambulatory Visit: Payer: Self-pay | Admitting: Family Medicine

## 2019-12-10 DIAGNOSIS — I10 Essential (primary) hypertension: Secondary | ICD-10-CM

## 2019-12-13 ENCOUNTER — Ambulatory Visit
Admission: RE | Admit: 2019-12-13 | Discharge: 2019-12-13 | Disposition: A | Discharge status: Home / Self Care | Payer: Medicare PPO | Source: Ambulatory Visit | Attending: Family Medicine | Admitting: Family Medicine

## 2019-12-13 ENCOUNTER — Other Ambulatory Visit: Payer: Self-pay

## 2019-12-13 DIAGNOSIS — M810 Age-related osteoporosis without current pathological fracture: Secondary | ICD-10-CM

## 2019-12-13 DIAGNOSIS — Z5181 Encounter for therapeutic drug level monitoring: Secondary | ICD-10-CM

## 2019-12-16 ENCOUNTER — Other Ambulatory Visit: Payer: Self-pay | Admitting: Family Medicine

## 2019-12-16 DIAGNOSIS — I10 Essential (primary) hypertension: Secondary | ICD-10-CM

## 2019-12-19 ENCOUNTER — Other Ambulatory Visit: Payer: Self-pay | Admitting: Family Medicine

## 2019-12-19 DIAGNOSIS — I10 Essential (primary) hypertension: Secondary | ICD-10-CM

## 2019-12-23 ENCOUNTER — Other Ambulatory Visit: Payer: Self-pay | Admitting: Family Medicine

## 2019-12-23 DIAGNOSIS — I10 Essential (primary) hypertension: Secondary | ICD-10-CM

## 2019-12-29 ENCOUNTER — Other Ambulatory Visit: Payer: Self-pay | Admitting: Family Medicine

## 2019-12-29 DIAGNOSIS — E7800 Pure hypercholesterolemia, unspecified: Secondary | ICD-10-CM

## 2020-01-05 ENCOUNTER — Other Ambulatory Visit: Payer: Medicare PPO

## 2020-01-05 ENCOUNTER — Other Ambulatory Visit: Payer: Self-pay

## 2020-01-05 DIAGNOSIS — M810 Age-related osteoporosis without current pathological fracture: Secondary | ICD-10-CM | POA: Diagnosis not present

## 2020-01-05 MED ORDER — DENOSUMAB 60 MG/ML ~~LOC~~ SOSY
60.0000 mg | PREFILLED_SYRINGE | Freq: Once | SUBCUTANEOUS | Status: AC
  Administered 2020-01-05: 14:00:00 60 mg via SUBCUTANEOUS

## 2020-01-19 ENCOUNTER — Other Ambulatory Visit: Payer: Self-pay | Admitting: Family Medicine

## 2020-01-19 DIAGNOSIS — I10 Essential (primary) hypertension: Secondary | ICD-10-CM

## 2020-01-30 ENCOUNTER — Other Ambulatory Visit: Payer: Self-pay | Admitting: Family Medicine

## 2020-01-30 ENCOUNTER — Other Ambulatory Visit: Payer: Medicare PPO

## 2020-01-30 DIAGNOSIS — I10 Essential (primary) hypertension: Secondary | ICD-10-CM

## 2020-03-16 ENCOUNTER — Other Ambulatory Visit: Payer: Self-pay | Admitting: Family Medicine

## 2020-03-16 DIAGNOSIS — I10 Essential (primary) hypertension: Secondary | ICD-10-CM

## 2020-04-10 ENCOUNTER — Other Ambulatory Visit: Payer: Self-pay | Admitting: Family Medicine

## 2020-04-10 DIAGNOSIS — E7800 Pure hypercholesterolemia, unspecified: Secondary | ICD-10-CM

## 2020-04-30 ENCOUNTER — Other Ambulatory Visit: Payer: Self-pay | Admitting: Family Medicine

## 2020-04-30 DIAGNOSIS — Z1231 Encounter for screening mammogram for malignant neoplasm of breast: Secondary | ICD-10-CM

## 2020-05-07 ENCOUNTER — Other Ambulatory Visit: Payer: Self-pay | Admitting: Family Medicine

## 2020-05-07 DIAGNOSIS — I10 Essential (primary) hypertension: Secondary | ICD-10-CM

## 2020-05-08 ENCOUNTER — Other Ambulatory Visit: Payer: Self-pay | Admitting: Family Medicine

## 2020-05-08 DIAGNOSIS — I10 Essential (primary) hypertension: Secondary | ICD-10-CM

## 2020-05-20 ENCOUNTER — Other Ambulatory Visit: Payer: Self-pay | Admitting: Family Medicine

## 2020-05-20 DIAGNOSIS — I10 Essential (primary) hypertension: Secondary | ICD-10-CM

## 2020-05-23 NOTE — Patient Instructions (Addendum)
  HEALTH MAINTENANCE RECOMMENDATIONS:  It is recommended that you get at least 30 minutes of aerobic exercise at least 5 days/week (for weight loss, you may need as much as 60-90 minutes). This can be any activity that gets your heart rate up. This can be divided in 10-15 minute intervals if needed, but try and build up your endurance at least once a week.  Weight bearing exercise is also recommended twice weekly.  Eat a healthy diet with lots of vegetables, fruits and fiber.  "Colorful" foods have a lot of vitamins (ie green vegetables, tomatoes, red peppers, etc).  Limit sweet tea, regular sodas and alcoholic beverages, all of which has a lot of calories and sugar.  Up to 1 alcoholic drink daily may be beneficial for women (unless trying to lose weight, watch sugars).  Drink a lot of water.  Calcium recommendations are 1200-1500 mg daily (1500 mg for postmenopausal women or women without ovaries), and vitamin D 1000 IU daily.  This should be obtained from diet and/or supplements (vitamins), and calcium should not be taken all at once, but in divided doses.  Monthly self breast exams and yearly mammograms for women over the age of 33 is recommended.  Sunscreen of at least SPF 30 should be used on all sun-exposed parts of the skin when outside between the hours of 10 am and 4 pm (not just when at beach or pool, but even with exercise, golf, tennis, and yard work!)  Use a sunscreen that says "broad spectrum" so it covers both UVA and UVB rays, and make sure to reapply every 1-2 hours.  Remember to change the batteries in your smoke detectors when changing your clock times in the spring and fall.  Use your seat belt every time you are in a car, and please drive safely and not be distracted with cell phones and texting while driving.   Ms. Carriger , Thank you for taking time to come for your Medicare Wellness Visit. I appreciate your ongoing commitment to your health goals. Please review the following  plan we discussed and let me know if I can assist you in the future.    This is a list of the screening recommended for you and due dates:  Health Maintenance  Topic Date Due  . Colon Cancer Screening  02/02/2019  . Complete foot exam   10/05/2019  . Flu Shot  04/15/2020  . Hemoglobin A1C  05/16/2020  . Eye exam for diabetics  05/24/2021  . Tetanus Vaccine  01/03/2027  . DEXA scan (bone density measurement)  Completed  . COVID-19 Vaccine  Completed  . Pneumonia vaccines  Completed   Foot exam and A1c were performed at today's visit. I believe you and GI had discussed no longer needing colonoscopy based on age and lack of symptoms. Bone density test will be due again 11/2021. You may be a candidate for COVID booster vaccine in October (8 months after the 2nd shot is what they are currently saying, pay attention to the news in case the recommendations change).  Your ears were normal, there is no wax buildup.  If you are having any allergy symptoms, that can affect the ears also.  You can try claritin once daily to see if that helps with your hearing.

## 2020-05-23 NOTE — Progress Notes (Signed)
Chief Complaint  Patient presents with  . Medicare Wellness    fasting AWV no gyn exam. Has a "crick" in her neck and is seeing chiropractor. Also wants her ears cleaned out. Having burning with urination.   . Medication Refill    needs carvedilol and gabapentin.     Kristin Willis is a 86 y.o. female who presents for annual physical exam, Medicare wellness visit and follow-up on chronic medical conditions.  She has the following concerns:  She reports some burning with urination and frequency at night.  She has known prolapse and is under the care of Dr. McDiarmid (see below).  She reports he said he can't do anything other than prescribing medication, doesn't want to operate on her.  She is taking Vesicare, and she thinks it helps some. She denies pain with urination.  Only has some burning if she holds it for too long. She uses a bedside commode.  Denies any incontinence.  Goes every 2 hours at night, can get back to sleep easily.  Wants ears cleaned out. Sounds like she is in a tunnel from the L ear. Denies pain or ringing in her ear. Has had some mild congestion/allergies.  She has had a "crick" in her neck. She has been going to chiropractor, getting treatment, using ice packs, and her neck is improving.  Osteoporosis:DEXA was last done in3/2021, T-1.4 L radius. This is improved since starting Prolia in 2019. She is tolerating Prolia without side effects. Last injection was in 12/2019. She continues to take Calcium with D,and drinks 1 glass of milk and has cheese. She is also takingVitamin D3 1000 IU daily. She continues to take MVI(contains400 IU). She had a normal Vitamin D level in 11/2017 (45.4).  Hypertension: She reports compliance with taking amlodipine, carvedilol, HCTZ(and klorcon),andlosartan. Denies any lightheadedness, dizziness, chest pain, palpitations.  Blood pressure has been running 100-129/63-70.  Once had <100, but denies any symptoms/dizziness.  In April/May  BP's were higher (150-160's), but she has lost a lot of weight since that time and BP improved. She lost the weight doing Rickard Patience, working on losing a few more pounds. She started this 6 months ago.   Diabetes: Controlled by diet, she has never taken medications. Sugar are running 87-100 (fasting). Lastdiabeticeye exam was 11/2019, showed retinopathy. Denies polydipsia, polyuria. She has numbnessin her left foot since her minor stroke, unchanged. She denies any burning or sores on her feet. Lab Results  Component Value Date   HGBA1C 5.2 11/14/2019   OAB--She had been goingtoDr. McDiarmid's officemonthly for transcutaneous nerve stimulationtreatments (sees him just 1-2x/year).She had stopped going last year because the office was too crowded. She never restarted this. She continues on Vesicare from urologist. She started taking Ultra-nol total bladder support, which she thinks helps. Continues to have bladder prolapse (pessaries didn't work well, would fall out). See above.  Hyperlipidemia-- Sheis takinga fish oil capsule that she found that does not have red dye, $Remove'1000mg'LDHKgud$ , taking 2 daily (increased from 1 daily after last labs).Trying to follow a low fat diet. She limits her carbs, sweets, fried foods. Snacks on cheese. Compliant with Zetia. TG were elevated on last check. She is due for recheck. Lab Results  Component Value Date   CHOL 153 05/12/2019   HDL 35 (L) 05/12/2019   LDLCALC 71 05/12/2019   TRIG 233 (H) 05/12/2019   CHOLHDL 4.4 05/12/2019   Memory concerns:She continues to take Prevagen, and she finds it effective. She notices improvement--easier to recall  names.She denies any change in the last year.  H/o CVA, on Plavix. She denies any bleeding. She has residual numbness of L foot. Also felt to have a mild peripheral neuropathy, possibly related to her diabetes (diet controlled).She remains on gabapentin (started by Dr. Jana Hakim in the past). She denies any  pain in her foot, just a mild tingling/numbness, unchanged. She no longer sees Dr. Jannifer Franklin.  Gout: She hasn't had a flare in many years. She previously took allopurinol,, but hasn't had anyflares since stopping it. Last check of uric acid had been OFF the allopurinol, and remained normal. Lab Results  Component Value Date   LABURIC 4.8 02/16/2017   History of Membranous nephropathy.  [name] retired.  She will be seeing new doctor, Dr. Hollie Salk in April. Urine in March did not show any proteinuria.   Immunization History  Administered Date(s) Administered  . Influenza Split 06/14/2012, 06/09/2013, 05/30/2014  . Influenza Whole 07/06/2002, 06/28/2008, 06/25/2011  . Influenza, High Dose Seasonal PF 05/11/2017, 07/06/2018, 05/03/2019  . Influenza-Unspecified 05/31/2015, 05/27/2016  . PFIZER SARS-COV-2 Vaccination 10/12/2019, 11/09/2019  . Pneumococcal Conjugate-13 01/09/2014  . Pneumococcal Polysaccharide-23 04/05/2007, 02/16/2017  . Tdap 04/05/2007, 01/02/2017  . Zoster 09/15/2004  . Zoster Recombinat (Shingrix) 07/06/2018, 05/03/2019   Last Pap smear:11/2017(h/o endometrial CA)--sees Dr. Nori Riis. She reports she went again, during the pandemic, and had another pap (pt requested). Last mammogram:05/2019, scheduled for 06/07/2020 Last colonoscopy:01/2014--tubular adenoma. F/u not recommended due to age, per Dr. Cristina Gong Last DEXA:11/2019 T-1.4 at left radius Dentist: sees Dr. Essie Hart every 4 months and reports they do all of her cleanings, x-rays, etc. Ophtho:Dr. Rankin yearly Exercise: Very limited, just housework. Stopped water aerobics due to fear of slipping/falling.  Uses some bands for exercises. Vit D-OH 38 in 07/2015, 45.4 in 12/2017.  Other doctors caring for patient include: GYN:Dr. Nori Riis Nephro: Dr. Hollie Salk Neuro: Dr. Jannifer Franklin (retired) Urology: Dr. McDiarmid Ortho: Dr. Tamera Punt ([facility]) Ophtho: Dr. Zadie Rhine Dentist:Dr. Jone Baseman (just twice), and sees Dr. Essie Hart  (periodontist) GI: Dr. Cristina Gong Derm: Dr. Michele Mcalpine (in [place]) Wound doctor: Dr. Wayland Denis, no longer sees) Podiatry: Dr. March Rummage  Depression screen:Negative Falls screen:1 (fell while making her bed, lost her balance). No injury.  She has bracelet and phone numbers to call for assistance if she falls. Functional status survey: notable for trouble hearing with L ear,leakage of urine.Has hearing aids (doesn't use). Memory is stable. Doesn't like stairs--afraid of falling. Uses cane. Diarrhea with certain foods (onions, corn) Mini-Cog screen:normal (5)  See Epic for full questionnaires  End of Life Discussion: Patient hasa living will and medical power of attorney.   PMH, PSH, SH and FH reviewed and updated  Outpatient Encounter Medications as of 05/24/2020  Medication Sig Note  . Acetaminophen (TYLENOL ARTHRITIS PAIN PO) Take 2 tablets by mouth 2 (two) times daily.    Marland Kitchen amLODipine (NORVASC) 5 MG tablet TAKE 1 TABLET BY MOUTH EVERY DAY   . Apoaequorin (PREVAGEN PO) Take 1 tablet by mouth daily.   . Ascorbic Acid (VITAMIN C) 500 MG CAPS Take 1 tablet by mouth daily.   . Blood Glucose Monitoring Suppl (ACCU-CHEK GUIDE) w/Device KIT 1 each by Does not apply route 2 (two) times daily.   . Calcium Carbonate-Vitamin D (CALTRATE 600+D PO) Take 2 tablets by mouth daily.    . carvedilol (COREG) 25 MG tablet TAKE 1 & 1/2 TABLETS BY MOUTH TWICE A DAY   . cholecalciferol (VITAMIN D) 1000 units tablet Take 1,000 Units by mouth daily.   Marland Kitchen  clopidogrel (PLAVIX) 75 MG tablet Take 1 tablet (75 mg total) by mouth daily.   . Coenzyme Q10 (CO Q 10 PO) Take 1 tablet by mouth daily.    Marland Kitchen ezetimibe (ZETIA) 10 MG tablet TAKE 1 TABLET BY MOUTH EVERY DAY   . FREESTYLE LITE test strip TEST BLOOD SUGAR TWICE DAILY   . gabapentin (NEURONTIN) 300 MG capsule TAKE 1 CAPSULE BY MOUTH EVERY NIGHT AT BEDTIME   . hydrochlorothiazide (HYDRODIURIL) 12.5 MG tablet TAKE 1 TABLET BY MOUTH EVERY DAY   . KLOR-CON M10  10 MEQ tablet TAKE 1 TABLET BY MOUTH EVERY DAY   . Lancets Micro Thin 33G MISC 1 each by Does not apply route 2 (two) times daily.   Marland Kitchen losartan (COZAAR) 100 MG tablet TAKE 1 TABLET BY MOUTH EVERY DAY   . Misc Natural Products (OSTEO BI-FLEX ADV TRIPLE ST PO) Take 2 capsules by mouth daily.   . Multiple Vitamin (MULTIVITAMIN) tablet Take 1 tablet by mouth daily. 11/14/2019: For diabetics  . NON FORMULARY Take 1 capsule by mouth at bedtime. 11/14/2019: Ultra-nol bladder support  . Omega-3 Fatty Acids (FISH OIL) 1000 MG CAPS Take 1 capsule by mouth 2 (two) times daily. 12/07/2017: Reservatrol 2 daily-omega plus  . solifenacin (VESICARE) 5 MG tablet Take 5 mg by mouth daily.   Marland Kitchen Specialty Vitamins Products ([employer] HAIR/SKIN/NAILS PO) Take 2 each by mouth daily.   . TURMERIC PO Take 1 tablet by mouth daily.   . cephALEXin (KEFLEX) 500 MG capsule Take 1 tablet prior to dental procedure, take second tablet evening after dental procedure (Patient not taking: Reported on 12/07/2017)   . nitrofurantoin, macrocrystal-monohydrate, (MACROBID) 100 MG capsule Take 1 capsule (100 mg total) by mouth 2 (two) times daily. (Patient not taking: Reported on 05/24/2020)   . Zinc 50 MG CAPS Take 1 capsule by mouth daily.   . [DISCONTINUED] potassium chloride (K-DUR) 10 MEQ tablet TAKE 1 TABLET BY MOUTH EVERY DAY   . [DISCONTINUED] triamcinolone cream (KENALOG) 0.1 % Apply sparingly to affected area of right leg, twice daily until resolved (max use 2 weeks) (Patient not taking: Reported on 05/24/2020)    No facility-administered encounter medications on file as of 05/24/2020.   Allergies  Allergen Reactions  . Ciprofloxacin Nausea And Vomiting  . Codeine Nausea And Vomiting  . Contrast Media [Iodinated Diagnostic Agents] Nausea And Vomiting  . Morphine And Related Nausea And Vomiting  . Statins Other (See Comments)    lethargic  . Toprol Xl [Metoprolol Tartrate]     Symptoms of heart attack, per pt, told not to take it again  (?chest pain)  . Clindamycin/Lincomycin Itching and Rash  . Penicillins Hives, Swelling and Rash    Has patient had a PCN reaction causing immediate rash, facial/tongue/throat swelling, SOB or lightheadedness with hypotension: no Has patient had a PCN reaction causing severe rash ivolving mucus membranes or skin necrosis: no Has patient had a PCN reaction that required hospitalization no Has patient had a PCN reaction occurring within the last 10 years: no If all of the above answers are "NO", then may proceed with Cephalosporin use.  . Prednisone Palpitations    ROS:The patient denies anorexia, fever,headaches, vision changes, ear pain, sore throat, breast concerns, chest pain, palpitations, dizziness, syncope, dyspnea on exertion, cough, swelling, nausea, vomiting, constipation, abdominal pain, melena, hematochezia, indigestion/heartburn, hematuria, dysuria, vaginal bleeding, discharge, odor or itch, genital lesions, numbness (just residual from stroke), tingling, weakness, tremor, suspicious skin lesions, depression, anxiety, abnormal bleeding or  enlarged lymph nodes. Incontinence(wears pads, overall controlled with medication). Up 3x/night to void Frozen right shoulder, limited range of motion. Topical medications are used to treat mild discomfort. sciatica resolved. R knee pain/arthritis, pain isn't daily. +weight loss, intentional See HPI  PHYSICAL EXAM:  BP 122/68   Pulse 64   Ht $R'5\' 5"'ql$  (1.651 m)   Wt 183 lb (83 kg)   BMI 30.45 kg/m   Wt Readings from Last 3 Encounters:  05/24/20 183 lb (83 kg)  11/14/19 197 lb (89.4 kg)  05/12/19 196 lb 12.8 oz (89.3 kg)    General Appearance:  Alert, cooperative, no distress, appearsstated age  Head:  Normocephalic, without obvious abnormality, atraumatic  Eyes:  PERRL, conjunctiva/corneas clear, EOM's intact, fundi  benign  Ears:  Normal TM's and external ear canals. No cerumen present  Nose:   Not examined (wearing  mask due to COVID-19 pandemic)  Throat: Not examined (wearing mask due to COVID-19 pandemic)  Neck: Supple, no lymphadenopathy; thyroid: no enlargement/ tenderness/nodules; no carotid bruit or JVD Mildly tender at posterior paraspinous muscles in neck bilaterally. No spinal tenderness.  Back:  Spine nontender, no curvature, ROM normal, no CVA tenderness.   Lungs:  Clear to auscultation bilaterally without wheezes, rales or ronchi; respirations unlabored  Chest Wall:  No tenderness or deformity  Heart:  Regular rate and rhythm, S1 and S2 normal, no murmur, rub or gallop  Breast Exam:  Deferred to GYN  Abdomen:  Soft, non-tender, nondistended, normoactive bowel sounds,  no masses, no hepatosplenomegaly. Easily reducible, small umbilical hernia is present.  Genitalia:  Deferred to GYN     Extremities: No clubbing, cyanosis. Trace edema at ankles bilaterally.  Pulses: 2+ and symmetric all extremities  Skin: Skin color, texture, turgor normal, no rashes or lesions. Purpuric lesions and bruising noted on lower legs and forearms. Lower legs are very dry/flaky  Lymph nodes: Cervical, supraclavicular nodes normal  Neurologic: CNII-XII intact, normal strength, sensation and gait; reflexes 2+ and symmetric throughout  Psych: Normal mood, affect, hygiene and grooming.  Lab Results  Component Value Date   HGBA1C 5.2 05/24/2020    Diabetic foot exam--decreased monofilament sensation on bottom of left great toe, normal elsewhere. Thickened toenails.  Normal pulses.    ASSESSMENT/PLAN:  Annual physical exam  Medicare annual wellness visit, subsequent  Controlled type 2 diabetes mellitus with retinopathy, without long-term current use of insulin, macular edema presence unspecified, unspecified laterality, unspecified retinopathy severity ([place]) - Plan: HgB A1c  Diet-controlled diabetes mellitus ([place]) - Plan: Comprehensive  metabolic panel  Osteoporosis without current pathological fracture, unspecified osteoporosis type - improved, continue Prolia, Ca, D, weight-bearing exercise rec  Mixed hyperlipidemia - Plan: Lipid panel  History of membranous glomerulonephritis  Nonproliferative diabetic retinopathy ([place])  History of stroke - on Plavix, minimal residual numbness L foot.  Senile purpura (HCC) - on lower extremities and forearms (related to plavix, age)  Burning with urination - chronic, intermittent.  She has no symptoms of UTI, just her chronic issues. She wanted urine done, but not treating for infection - Plan: POCT Urinalysis DIP (Proadvantage Device)  Need for influenza vaccination - Plan: Flu Vaccine QUAD High Dose(Fluad), Lipid panel, Comprehensive metabolic panel  Medication monitoring encounter  Essential hypertension, benign - controlled - Plan: Comprehensive metabolic panel, carvedilol (COREG) 25 MG tablet  Will get records/pap results from Dr. Nori Riis.   Discussed monthly self breast exams and yearly mammograms; at least 30 minutes of aerobic activity at least 5 days/week and weight-bearing exercise  2x/week; proper sunscreen use reviewed; healthy diet, including goals of calcium and vitamin D intake and alcohol recommendations (less than or equal to 1 drink/day) reviewed; regular seatbelt use; changing batteries in smoke detectors. Immunization recommendations discussed--continue yearly high dose flu shots.Discussed possible COVID booster shots. Colonoscopy recommendations reviewed, due again 2020 based on pathology from polyp, but not needed due to age, since not having any GI problems.   Full Code, Full care MOST form discussed and completed.   Medicare Attestation I have personally reviewed: The patient's medical and social history Their use of alcohol, tobacco or illicit drugs Their current medications and supplements The patient's functional ability including ADLs,fall risks,  home safety risks, cognitive, and hearing and visual impairment Diet and physical activities Evidence for depression or mood disorders  The patient's weight, height, BMI have been recorded in the chart.  I have made referrals, counseling, and provided education to the patient based on review of the above and I have provided the patient with a written personalized care plan for preventive services.

## 2020-05-24 ENCOUNTER — Ambulatory Visit: Payer: Medicare PPO | Admitting: Family Medicine

## 2020-05-24 ENCOUNTER — Encounter: Payer: Self-pay | Admitting: Family Medicine

## 2020-05-24 ENCOUNTER — Other Ambulatory Visit: Payer: Self-pay

## 2020-05-24 VITALS — BP 122/68 | HR 64 | Ht 65.0 in | Wt 183.0 lb

## 2020-05-24 DIAGNOSIS — Z0000 Encounter for general adult medical examination without abnormal findings: Secondary | ICD-10-CM | POA: Diagnosis not present

## 2020-05-24 DIAGNOSIS — E119 Type 2 diabetes mellitus without complications: Secondary | ICD-10-CM | POA: Diagnosis not present

## 2020-05-24 DIAGNOSIS — M810 Age-related osteoporosis without current pathological fracture: Secondary | ICD-10-CM

## 2020-05-24 DIAGNOSIS — Z5181 Encounter for therapeutic drug level monitoring: Secondary | ICD-10-CM

## 2020-05-24 DIAGNOSIS — R300 Dysuria: Secondary | ICD-10-CM

## 2020-05-24 DIAGNOSIS — E113299 Type 2 diabetes mellitus with mild nonproliferative diabetic retinopathy without macular edema, unspecified eye: Secondary | ICD-10-CM

## 2020-05-24 DIAGNOSIS — Z23 Encounter for immunization: Secondary | ICD-10-CM | POA: Diagnosis not present

## 2020-05-24 DIAGNOSIS — D692 Other nonthrombocytopenic purpura: Secondary | ICD-10-CM

## 2020-05-24 DIAGNOSIS — I10 Essential (primary) hypertension: Secondary | ICD-10-CM

## 2020-05-24 DIAGNOSIS — E782 Mixed hyperlipidemia: Secondary | ICD-10-CM | POA: Diagnosis not present

## 2020-05-24 DIAGNOSIS — Z87448 Personal history of other diseases of urinary system: Secondary | ICD-10-CM

## 2020-05-24 DIAGNOSIS — E11319 Type 2 diabetes mellitus with unspecified diabetic retinopathy without macular edema: Secondary | ICD-10-CM | POA: Diagnosis not present

## 2020-05-24 DIAGNOSIS — Z8673 Personal history of transient ischemic attack (TIA), and cerebral infarction without residual deficits: Secondary | ICD-10-CM

## 2020-05-24 LAB — POCT URINALYSIS DIP (PROADVANTAGE DEVICE)
Glucose, UA: NEGATIVE mg/dL
Ketones, POC UA: NEGATIVE mg/dL
Nitrite, UA: NEGATIVE
Protein Ur, POC: 30 mg/dL — AB
Specific Gravity, Urine: 1.02
Urobilinogen, Ur: NEGATIVE
pH, UA: 5 (ref 5.0–8.0)

## 2020-05-24 LAB — POCT GLYCOSYLATED HEMOGLOBIN (HGB A1C): Hemoglobin A1C: 5.2 % (ref 4.0–5.6)

## 2020-05-24 MED ORDER — GABAPENTIN 300 MG PO CAPS: 300.0000 mg | ORAL_CAPSULE | Freq: Every day | ORAL | 3 refills | Status: DC

## 2020-05-24 MED ORDER — CARVEDILOL 25 MG PO TABS: 37.5000 mg | ORAL_TABLET | Freq: Two times a day (BID) | ORAL | 3 refills | Status: DC

## 2020-05-25 LAB — COMPREHENSIVE METABOLIC PANEL
ALT: 12 IU/L (ref 0–32)
AST: 17 IU/L (ref 0–40)
Albumin/Globulin Ratio: 1.6 (ref 1.2–2.2)
Albumin: 3.9 g/dL (ref 3.6–4.6)
Alkaline Phosphatase: 35 IU/L — ABNORMAL LOW (ref 48–121)
BUN/Creatinine Ratio: 33 — ABNORMAL HIGH (ref 12–28)
BUN: 27 mg/dL (ref 8–27)
Bilirubin Total: 0.7 mg/dL (ref 0.0–1.2)
CO2: 27 mmol/L (ref 20–29)
Calcium: 9.5 mg/dL (ref 8.7–10.3)
Chloride: 103 mmol/L (ref 96–106)
Creatinine, Ser: 0.81 mg/dL (ref 0.57–1.00)
GFR calc Af Amer: 74 mL/min/{1.73_m2} (ref >59)
GFR calc non Af Amer: 65 mL/min/{1.73_m2} (ref >59)
Globulin, Total: 2.5 g/dL (ref 1.5–4.5)
Glucose: 95 mg/dL (ref 65–99)
Potassium: 4.3 mmol/L (ref 3.5–5.2)
Sodium: 142 mmol/L (ref 134–144)
Total Protein: 6.4 g/dL (ref 6.0–8.5)

## 2020-05-25 LAB — LIPID PANEL
Chol/HDL Ratio: 4 ratio (ref 0.0–4.4)
Cholesterol, Total: 140 mg/dL (ref 100–199)
HDL: 35 mg/dL — ABNORMAL LOW (ref >39)
LDL Chol Calc (NIH): 80 mg/dL (ref 0–99)
Triglycerides: 138 mg/dL (ref 0–149)
VLDL Cholesterol Cal: 25 mg/dL (ref 5–40)

## 2020-05-25 NOTE — Progress Notes (Signed)
done

## 2020-06-07 ENCOUNTER — Other Ambulatory Visit: Payer: Self-pay

## 2020-06-07 ENCOUNTER — Ambulatory Visit
Admission: RE | Admit: 2020-06-07 | Discharge: 2020-06-07 | Disposition: A | Discharge status: Home / Self Care | Payer: Medicare PPO | Source: Ambulatory Visit | Attending: Family Medicine | Admitting: Family Medicine

## 2020-06-07 DIAGNOSIS — Z1231 Encounter for screening mammogram for malignant neoplasm of breast: Secondary | ICD-10-CM

## 2020-06-08 ENCOUNTER — Encounter: Payer: Self-pay | Admitting: Family Medicine

## 2020-06-11 ENCOUNTER — Other Ambulatory Visit: Payer: Self-pay | Admitting: Family Medicine

## 2020-06-11 DIAGNOSIS — R928 Other abnormal and inconclusive findings on diagnostic imaging of breast: Secondary | ICD-10-CM

## 2020-06-11 DIAGNOSIS — I10 Essential (primary) hypertension: Secondary | ICD-10-CM

## 2020-06-22 ENCOUNTER — Other Ambulatory Visit: Payer: Self-pay | Admitting: Family Medicine

## 2020-06-22 ENCOUNTER — Other Ambulatory Visit: Payer: Self-pay

## 2020-06-22 ENCOUNTER — Ambulatory Visit
Admission: RE | Admit: 2020-06-22 | Discharge: 2020-06-22 | Disposition: A | Discharge status: Home / Self Care | Payer: Medicare PPO | Source: Ambulatory Visit | Attending: Family Medicine | Admitting: Family Medicine

## 2020-06-22 DIAGNOSIS — R928 Other abnormal and inconclusive findings on diagnostic imaging of breast: Secondary | ICD-10-CM

## 2020-06-22 DIAGNOSIS — N6041 Mammary duct ectasia of right breast: Secondary | ICD-10-CM

## 2020-07-08 ENCOUNTER — Other Ambulatory Visit: Payer: Self-pay | Admitting: Family Medicine

## 2020-07-08 DIAGNOSIS — I10 Essential (primary) hypertension: Secondary | ICD-10-CM

## 2020-07-09 ENCOUNTER — Ambulatory Visit
Admission: RE | Admit: 2020-07-09 | Discharge: 2020-07-09 | Disposition: A | Discharge status: Home / Self Care | Payer: Medicare PPO | Source: Ambulatory Visit | Attending: Family Medicine | Admitting: Family Medicine

## 2020-07-09 ENCOUNTER — Other Ambulatory Visit: Payer: Self-pay

## 2020-07-09 DIAGNOSIS — N6041 Mammary duct ectasia of right breast: Secondary | ICD-10-CM

## 2020-07-09 NOTE — Telephone Encounter (Signed)
Is this okay to refill? 

## 2020-07-10 ENCOUNTER — Ambulatory Visit: Payer: Medicare PPO

## 2020-07-10 DIAGNOSIS — M810 Age-related osteoporosis without current pathological fracture: Secondary | ICD-10-CM

## 2020-07-10 MED ORDER — DENOSUMAB 60 MG/ML ~~LOC~~ SOSY: 60.0000 mg | PREFILLED_SYRINGE | Freq: Once | SUBCUTANEOUS | Status: DC

## 2020-07-12 ENCOUNTER — Encounter: Payer: Self-pay | Admitting: Family Medicine

## 2020-07-12 ENCOUNTER — Other Ambulatory Visit: Payer: Self-pay | Admitting: Family Medicine

## 2020-07-12 DIAGNOSIS — E7800 Pure hypercholesterolemia, unspecified: Secondary | ICD-10-CM

## 2020-07-30 ENCOUNTER — Other Ambulatory Visit: Payer: Self-pay | Admitting: Family Medicine

## 2020-07-30 DIAGNOSIS — I10 Essential (primary) hypertension: Secondary | ICD-10-CM

## 2020-07-30 DIAGNOSIS — E7800 Pure hypercholesterolemia, unspecified: Secondary | ICD-10-CM

## 2020-08-07 ENCOUNTER — Other Ambulatory Visit: Payer: Self-pay | Admitting: Family Medicine

## 2020-08-07 DIAGNOSIS — I10 Essential (primary) hypertension: Secondary | ICD-10-CM

## 2020-08-17 ENCOUNTER — Ambulatory Visit (INDEPENDENT_AMBULATORY_CARE_PROVIDER_SITE_OTHER): Payer: Medicare PPO

## 2020-08-17 ENCOUNTER — Other Ambulatory Visit: Payer: Self-pay

## 2020-08-17 DIAGNOSIS — Z23 Encounter for immunization: Secondary | ICD-10-CM

## 2020-08-22 ENCOUNTER — Encounter: Payer: Self-pay | Admitting: *Deleted

## 2020-09-03 ENCOUNTER — Other Ambulatory Visit: Payer: Self-pay | Admitting: Family Medicine

## 2020-09-03 DIAGNOSIS — Z8673 Personal history of transient ischemic attack (TIA), and cerebral infarction without residual deficits: Secondary | ICD-10-CM

## 2020-09-11 ENCOUNTER — Encounter: Payer: Self-pay | Admitting: Medical

## 2020-09-11 ENCOUNTER — Telehealth (INDEPENDENT_AMBULATORY_CARE_PROVIDER_SITE_OTHER): Payer: Medicare PPO | Admitting: Medical

## 2020-09-11 VITALS — Temp 97.8°F | Wt 186.0 lb

## 2020-09-11 DIAGNOSIS — E1151 Type 2 diabetes mellitus with diabetic peripheral angiopathy without gangrene: Secondary | ICD-10-CM | POA: Diagnosis not present

## 2020-09-11 DIAGNOSIS — Z20822 Contact with and (suspected) exposure to covid-19: Secondary | ICD-10-CM | POA: Diagnosis not present

## 2020-09-11 DIAGNOSIS — R0981 Nasal congestion: Secondary | ICD-10-CM

## 2020-09-11 DIAGNOSIS — I10 Essential (primary) hypertension: Secondary | ICD-10-CM

## 2020-09-11 NOTE — Progress Notes (Signed)
Subjective:     Patient ID: Kristin Willis, female   DOB: 1929/05/05, 86 y.o.   MRN: [id number]  This visit type was conducted due to national recommendations for restrictions regarding the COVID-19 Pandemic (e.g. social distancing) in an effort to limit this patient's exposure and mitigate transmission in our community.  Due to their co-morbid illnesses, this patient is at least at moderate risk for complications without adequate follow up.  This format is felt to be most appropriate for this patient at this time.    Documentation for virtual audio and video telecommunications through [place] encounter:  The patient was located at home. The provider was located in the office. The patient did consent to this visit and is aware of possible charges through their insurance for this visit.  The other persons participating in this telemedicine service were son, Kristin Willis Time spent on call was 20 minutes and in review of previous records 20 minutes total.  This virtual service is not related to other E/M service within previous 7 days.   HPI Chief Complaint  Patient presents with  . Covid Positive    Inhome covid test- positive, mild congestion,    Virtual consult today for +covid test at home today.  Has had some mild symptoms.   A guest at a Christmas event tested positive, so she wanted get tested.  They used a home test today that was +.  Symptoms began a few days ago.   Feels mild cold symptoms so far.   Has some body aches.  No fever.  No NVD.  No SOB or wheezing.  Has some phelgm.  No bad headaches.  Is drinking.  Has been vaccinated including booster.    Son Kristin Willis who is on the virtual consult today wants her to have the new oral covid medication from [facility] or infusion therapy.   He also asked about ivermectin and Quercetin OTC.    No other aggravating or relieving factors. No other complaint.  Past Medical History:  Diagnosis Date  . Abnormality of gait 07/21/2016  .  Arthritis    right knee, left wrist  . Asthma    related to allergies when working; resolved  . Breast cancer ([place]) 7/08 Inrasine ductal L breast  . Colon polyps    4/09--adenomatous. 04/2010--normal.  Repeat 2014  . DDD (degenerative disc disease), lumbar 4/06  . Diabetes mellitus    diet controlled  . Diabetic retinopathy   . DJD (degenerative joint disease)   . Dyslipidemia   . Endometrial cancer (HCC) hx  . Gout   . Hepatitis A history(in [place]-1950's)  . History of stroke 10/24/2013  . Hypertension   . Incontinence   . Membranous glomerulonephropathy    [name]  . Mini stroke [facility]) DrWillis  . Obesity, unspecified   . Osteopenia L hip, 4/06  . 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 Skin cancer    [name]  . Sliding hiatal hernia small sliding hiatal hernia  . Wears glasses    Current Outpatient Medications on File Prior to Visit  Medication Sig Dispense Refill  . Acetaminophen (TYLENOL ARTHRITIS PAIN PO) Take 2 tablets by mouth 2 (two) times daily.     Marland Kitchen amLODipine (NORVASC) 5 MG tablet TAKE 1 TABLET BY MOUTH EVERY DAY 90 tablet 1  . Apoaequorin (PREVAGEN PO) Take 1 tablet by mouth daily.    . Ascorbic Acid (VITAMIN C) 500 MG CAPS Take 1 tablet by mouth daily.    Marland Kitchen  Blood Glucose Monitoring Suppl (ACCU-CHEK GUIDE) w/Device KIT 1 each by Does not apply route 2 (two) times daily. 1 kit 0  . Calcium Carbonate-Vitamin D (CALTRATE 600+D PO) Take 2 tablets by mouth daily.     . carvedilol (COREG) 25 MG tablet Take 1.5 tablets (37.5 mg total) by mouth 2 (two) times daily with a meal. 145 tablet 3  . cephALEXin (KEFLEX) 500 MG capsule Take 1 tablet prior to dental procedure, take second tablet evening after dental procedure 2 capsule 0  . cholecalciferol (VITAMIN D) 1000 units tablet Take 1,000 Units by mouth daily.    . clopidogrel (PLAVIX) 75 MG tablet TAKE 1 TABLET BY MOUTH EVERY DAY 90 tablet 0  . Coenzyme Q10 (CO Q 10 PO) Take 1 tablet by mouth daily.      Marland Kitchen ezetimibe (ZETIA) 10 MG tablet TAKE 1 TABLET BY MOUTH EVERY DAY 90 tablet 0  . FREESTYLE LITE test strip TEST BLOOD SUGAR TWICE DAILY 50 strip 25  . gabapentin (NEURONTIN) 300 MG capsule Take 1 capsule (300 mg total) by mouth at bedtime. 90 capsule 3  . hydrochlorothiazide (HYDRODIURIL) 12.5 MG tablet TAKE 1 TABLET BY MOUTH EVERY DAY 90 tablet 0  . KLOR-CON M10 10 MEQ tablet TAKE 1 TABLET BY MOUTH EVERY DAY 90 tablet 1  . Lancets Micro Thin 33G MISC 1 each by Does not apply route 2 (two) times daily. 100 each 2  . losartan (COZAAR) 100 MG tablet TAKE 1 TABLET BY MOUTH EVERY DAY 90 tablet 0  . Misc Natural Products (OSTEO BI-FLEX ADV TRIPLE ST PO) Take 2 capsules by mouth daily.    . Multiple Vitamin (MULTIVITAMIN) tablet Take 1 tablet by mouth daily.    . solifenacin (VESICARE) 5 MG tablet Take 5 mg by mouth daily.    Marland Kitchen Specialty Vitamins Products ([employer]S HAIR/SKIN/NAILS PO) Take 2 each by mouth daily.    . TURMERIC PO Take 1 tablet by mouth daily.    . Zinc 50 MG CAPS Take 1 capsule by mouth daily.    . nitrofurantoin, macrocrystal-monohydrate, (MACROBID) 100 MG capsule Take 1 capsule (100 mg total) by mouth 2 (two) times daily. (Patient not taking: Reported on 05/24/2020) 14 capsule 0  . NON FORMULARY Take 1 capsule by mouth at bedtime.    . Omega-3 Fatty Acids (FISH OIL) 1000 MG CAPS Take 1 capsule by mouth 2 (two) times daily.    . [DISCONTINUED] potassium chloride (K-DUR) 10 MEQ tablet TAKE 1 TABLET BY MOUTH EVERY DAY 30 tablet 5   Current Facility-Administered Medications on File Prior to Visit  Medication Dose Route Frequency Provider Last Rate Last Admin  . denosumab (PROLIA) injection 60 mg  60 mg Subcutaneous Once Rita Ohara, MD         Review of Systems As in subjective    Objective:   Physical Exam Due to coronavirus pandemic stay at home measures, patient visit was virtual and they were not examined in person.   Temp 97.8 F (36.6 C)   Wt 186 lb (84.4 kg)   BMI 30.95  kg/m   Gen: nad, answers questions appropriately, no obvious wheezing or dyspnea     Assessment:     Encounter Diagnoses  Name Primary?  . Exposure to COVID-19 virus Yes  . Head congestion   . DM (diabetes mellitus), type 2 with peripheral vascular complications ([place])   . Essential hypertension, benign        Plan:     I discussed  the concerns with patient and her son Kristin Willis. Currently she has had only a few days of mild symptoms.  We did discuss the fact that she is high risk given underlying co morbidities and age  I advised that currently the new oral Covid medication is not available yet (I called her pharmacy as well after the virtual consult, and the local pharmacy stated that this medicine is not available currently in [place] but has been rolled out to a few states)  We discussed that the current data does not show and will support the use of ivermectin or some of the more controversial medications. I discussed that up-to-date review today still shows ivermectin and other adjuncts have still not shown significant benefit.  (Again, after discussing this with her pharmacy as well, these medicines are still not being prescribed routinely for covid and are actually widely unavailable)  We discussed the importance of hydration, rest, supportive therapy such as Tylenol, Robitussin-DM and vitamins such as vitamin D and zinc and vitamin C  We discussed that if she becomes significantly worse in the coming days towards dehydration, uncontrollable nausea vomiting, shortness of breath, etc.,  to call back immediately or get reevaluated immediately if symptoms start to change significantly  We will tentatively refer her to the infusion clinic at [facility]  We discussed typical timeframe for symptoms to last, potential complications and the need to get reevaluated if worse  Armani was seen today for covid positive.  Diagnoses and all orders for this  visit:  Exposure to COVID-19 virus  Head congestion  DM (diabetes mellitus), type 2 with peripheral vascular complications ([place])  Essential hypertension, benign  f/u if worse or not improving in the next few days

## 2020-09-12 ENCOUNTER — Telehealth: Payer: Self-pay | Admitting: Medical

## 2020-09-12 ENCOUNTER — Telehealth: Payer: Self-pay | Admitting: Internal Medicine

## 2020-09-12 NOTE — Telephone Encounter (Signed)
Advise no-- There is a short time frame for use (within the first 3-5 days), and if/when it becomes available in January, she will be out of that time frame.

## 2020-09-12 NOTE — Telephone Encounter (Signed)
Pt's son called back about visit yesterday. He wants to know about the new oral med for covid , advised him per Shane's note that he checked with pharmacy and it is not available in [place] yet. He wants to know if order can be sent to pharmacy now so it is there when the medication becomes available

## 2020-09-12 NOTE — Telephone Encounter (Signed)
Left message on sons voicemail  

## 2020-10-10 DIAGNOSIS — Z124 Encounter for screening for malignant neoplasm of cervix: Secondary | ICD-10-CM | POA: Diagnosis not present

## 2020-10-10 DIAGNOSIS — Z6829 Body mass index (BMI) 29.0-29.9, adult: Secondary | ICD-10-CM | POA: Diagnosis not present

## 2020-10-16 ENCOUNTER — Other Ambulatory Visit: Payer: Self-pay

## 2020-10-16 ENCOUNTER — Encounter (INDEPENDENT_AMBULATORY_CARE_PROVIDER_SITE_OTHER): Payer: Self-pay | Admitting: Ophthalmology

## 2020-10-16 ENCOUNTER — Ambulatory Visit (INDEPENDENT_AMBULATORY_CARE_PROVIDER_SITE_OTHER): Payer: Medicare PPO | Admitting: Ophthalmology

## 2020-10-16 DIAGNOSIS — E113391 Type 2 diabetes mellitus with moderate nonproliferative diabetic retinopathy without macular edema, right eye: Secondary | ICD-10-CM | POA: Diagnosis not present

## 2020-10-16 DIAGNOSIS — E113293 Type 2 diabetes mellitus with mild nonproliferative diabetic retinopathy without macular edema, bilateral: Secondary | ICD-10-CM

## 2020-10-16 DIAGNOSIS — H43813 Vitreous degeneration, bilateral: Secondary | ICD-10-CM

## 2020-10-16 DIAGNOSIS — E113392 Type 2 diabetes mellitus with moderate nonproliferative diabetic retinopathy without macular edema, left eye: Secondary | ICD-10-CM

## 2020-10-16 DIAGNOSIS — E113291 Type 2 diabetes mellitus with mild nonproliferative diabetic retinopathy without macular edema, right eye: Secondary | ICD-10-CM | POA: Diagnosis not present

## 2020-10-16 DIAGNOSIS — E113292 Type 2 diabetes mellitus with mild nonproliferative diabetic retinopathy without macular edema, left eye: Secondary | ICD-10-CM | POA: Diagnosis not present

## 2020-10-16 DIAGNOSIS — E113393 Type 2 diabetes mellitus with moderate nonproliferative diabetic retinopathy without macular edema, bilateral: Secondary | ICD-10-CM

## 2020-10-16 NOTE — Assessment & Plan Note (Signed)

## 2020-10-16 NOTE — Progress Notes (Signed)
10/16/2020     CHIEF COMPLAINT Patient presents for Retina Follow Up (10 Month NPDR f\u OU. OCT/Pt states no changes in vision. Denies FOL and floaters./BGL: did not check/A1C: 5.2)   HISTORY OF PRESENT ILLNESS: Kristin Willis is a 87 y.o. female who presents to the clinic today for:   HPI    Retina Follow Up    Patient presents with  Diabetic Retinopathy.  In both eyes.  Severity is moderate.  Duration of 10 months.  Since onset it is stable.  I, the attending physician,  performed the HPI with the patient and updated documentation appropriately. Additional comments: 10 Month NPDR f\u OU. OCT Pt states no changes in vision. Denies FOL and floaters. BGL: did not check A1C: 5.2       Last edited by Tilda Franco on 10/16/2020  1:11 PM. (History)      Referring physician: Rita Ohara, [place],  [place] [address]  HISTORICAL INFORMATION:   Selected notes from the MEDICAL RECORD NUMBER    Lab Results  Component Value Date   HGBA1C 5.2 05/24/2020     CURRENT MEDICATIONS: No current outpatient medications on file. (Ophthalmic Drugs)   No current facility-administered medications for this visit. (Ophthalmic Drugs)   Current Outpatient Medications (Other)  Medication Sig  . Acetaminophen (TYLENOL ARTHRITIS PAIN PO) Take 2 tablets by mouth 2 (two) times daily.   Marland Kitchen amLODipine (NORVASC) 5 MG tablet TAKE 1 TABLET BY MOUTH EVERY DAY  . Apoaequorin (PREVAGEN PO) Take 1 tablet by mouth daily.  . Ascorbic Acid (VITAMIN C) 500 MG CAPS Take 1 tablet by mouth daily.  . Blood Glucose Monitoring Suppl (ACCU-CHEK GUIDE) w/Device KIT 1 each by Does not apply route 2 (two) times daily.  . Calcium Carbonate-Vitamin D (CALTRATE 600+D PO) Take 2 tablets by mouth daily.   . carvedilol (COREG) 25 MG tablet Take 1.5 tablets (37.5 mg total) by mouth 2 (two) times daily with a meal.  . cephALEXin (KEFLEX) 500 MG capsule Take 1 tablet prior to dental procedure, take second  tablet evening after dental procedure  . cholecalciferol (VITAMIN D) 1000 units tablet Take 1,000 Units by mouth daily.  . clopidogrel (PLAVIX) 75 MG tablet TAKE 1 TABLET BY MOUTH EVERY DAY  . Coenzyme Q10 (CO Q 10 PO) Take 1 tablet by mouth daily.   Marland Kitchen ezetimibe (ZETIA) 10 MG tablet TAKE 1 TABLET BY MOUTH EVERY DAY  . FREESTYLE LITE test strip TEST BLOOD SUGAR TWICE DAILY  . gabapentin (NEURONTIN) 300 MG capsule Take 1 capsule (300 mg total) by mouth at bedtime.  . hydrochlorothiazide (HYDRODIURIL) 12.5 MG tablet TAKE 1 TABLET BY MOUTH EVERY DAY  . KLOR-CON M10 10 MEQ tablet TAKE 1 TABLET BY MOUTH EVERY DAY  . Lancets Micro Thin 33G MISC 1 each by Does not apply route 2 (two) times daily.  Marland Kitchen losartan (COZAAR) 100 MG tablet TAKE 1 TABLET BY MOUTH EVERY DAY  . Misc Natural Products (OSTEO BI-FLEX ADV TRIPLE ST PO) Take 2 capsules by mouth daily.  . Multiple Vitamin (MULTIVITAMIN) tablet Take 1 tablet by mouth daily.  . nitrofurantoin, macrocrystal-monohydrate, (MACROBID) 100 MG capsule Take 1 capsule (100 mg total) by mouth 2 (two) times daily. (Patient not taking: Reported on 05/24/2020)  . NON FORMULARY Take 1 capsule by mouth at bedtime.  . Omega-3 Fatty Acids (FISH OIL) 1000 MG CAPS Take 1 capsule by mouth 2 (two) times daily.  . solifenacin (VESICARE) 5 MG  tablet Take 5 mg by mouth daily.  Marland Kitchen Specialty Vitamins Products ([employer] HAIR/SKIN/NAILS PO) Take 2 each by mouth daily.  . TURMERIC PO Take 1 tablet by mouth daily.  . Zinc 50 MG CAPS Take 1 capsule by mouth daily.   Current Facility-Administered Medications (Other)  Medication Route  . denosumab (PROLIA) injection 60 mg Subcutaneous      REVIEW OF SYSTEMS:    ALLERGIES Allergies  Allergen Reactions  . Ciprofloxacin Nausea And Vomiting  . Codeine Nausea And Vomiting  . Contrast Media [Iodinated Diagnostic Agents] Nausea And Vomiting  . Morphine And Related Nausea And Vomiting  . Statins Other (See Comments)    lethargic  .  Toprol Xl [Metoprolol Tartrate]     Symptoms of heart attack, per pt, told not to take it again (?chest pain)  . Clindamycin/Lincomycin Itching and Rash  . Penicillins Hives, Swelling and Rash    Has patient had a PCN reaction causing immediate rash, facial/tongue/throat swelling, SOB or lightheadedness with hypotension: no Has patient had a PCN reaction causing severe rash ivolving mucus membranes or skin necrosis: no Has patient had a PCN reaction that required hospitalization no Has patient had a PCN reaction occurring within the last 10 years: no If all of the above answers are "NO", then may proceed with Cephalosporin use.  . Prednisone Palpitations    PAST MEDICAL HISTORY Past Medical History:  Diagnosis Date  . Abnormality of gait 07/21/2016  . Arthritis    right knee, left wrist  . Asthma    related to allergies when working; resolved  . Breast cancer ([place]) 7/08 Inrasine ductal L breast  . Colon polyps    4/09--adenomatous. 04/2010--normal.  Repeat 2014  . DDD (degenerative disc disease), lumbar 4/06  . Diabetes mellitus    diet controlled  . Diabetic retinopathy   . DJD (degenerative joint disease)   . Dyslipidemia   . Endometrial cancer ([facility]) hx  . Gout   . Hepatitis A history(in [place]-1950's)  . History of stroke 10/24/2013  . Hypertension   . Incontinence   . Membranous glomerulonephropathy    [name]  . Mini stroke [facility]) DrWillis  . Obesity, unspecified   . Osteopenia L hip, 4/06  . 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 Skin cancer    [name]  . Sliding hiatal hernia small sliding hiatal hernia  . Wears glasses    Past Surgical History:  Procedure Laterality Date  . ABDOMINAL HYSTERECTOMY     endometrial cancer  . APPENDECTOMY    . BREAST LUMPECTOMY Left 8/08 L Breast ([name])  . CATARACT EXTRACTION    . COLONOSCOPY  8/08 [name] (normal)  . kidney stones    . left middle finger    . REPLACEMENT TOTAL KNEE  Left  . SKIN  CANCER EXCISION    . TOTAL KNEE ARTHROPLASTY     left    FAMILY HISTORY Family History  Problem Relation Age of Onset  . Heart attack Father   . Diabetes Father   . Heart attack Mother   . Hypertension Mother   . Diabetes Mother   . Hypertension Brother   . Stroke Brother   . Melanoma Brother   . Diabetes Brother   . Hypertension Son   . Alcohol abuse Son   . Stroke Paternal Grandmother   . Diabetes Paternal Grandfather   . Vision loss Paternal Grandfather        related to diabetes  . Cancer  Neg Hx     SOCIAL HISTORY Social History   Tobacco Use  . Smoking status: Never Smoker  . Smokeless tobacco: Never Used  Vaping Use  . Vaping Use: Never used  Substance Use Topics  . Alcohol use: No  . Drug use: No         OPHTHALMIC EXAM:  Base Eye Exam    Visual Acuity (Snellen - Linear)      Right Left   Dist cc 20/25 -2 20/25 -1   Correction: Glasses       Tonometry (Tonopen, 1:16 PM)      Right Left   Pressure 11 10       Pupils      Pupils Dark Light Shape React APD   Right PERRL 4 3 Round Brisk None   Left PERRL 4 3 Round Brisk None       Visual Fields (Counting fingers)      Left Right    Full Full       Neuro/Psych    Oriented x3: Yes   Mood/Affect: Normal       Dilation    Both eyes: 1.0% Mydriacyl, 2.5% Phenylephrine @ 1:16 PM        Slit Lamp and Fundus Exam    External Exam      Right Left   External Normal Normal       Slit Lamp Exam      Right Left   Lids/Lashes Normal Normal   Conjunctiva/Sclera White and quiet White and quiet   Cornea Clear Clear   Anterior Chamber Deep and quiet Deep and quiet   Iris Round and reactive Round and reactive   Lens Centered posterior chamber intraocular lens Centered posterior chamber intraocular lens   Anterior Vitreous Normal Normal       Fundus Exam      Right Left   Posterior Vitreous Posterior vitreous detachment Posterior vitreous detachment   Disc Normal Normal   C/D Ratio 0.2  0.25   Macula Normal Normal   Vessels NPDR- Mild NPDR- Mild   Periphery Normal Normal          IMAGING AND PROCEDURES  Imaging and Procedures for 10/16/20  OCT, Retina - OU - Both Eyes       Right Eye Quality was good. Scan locations included subfoveal. Central Foveal Thickness: 261. Progression has been stable.   Left Eye Quality was good. Scan locations included subfoveal. Central Foveal Thickness: 264. Progression has been stable. Findings include normal foveal contour.   Notes No active maculopathty                ASSESSMENT/PLAN:  Posterior vitreous detachment of both eyes   The nature of posterior vitreous detachment was discussed with the patient as well as its physiology, its age prevalence, and its possible implication regarding retinal breaks and detachment.  An informational brochure was given to the patient.  All the patient's questions were answered.  The patient was asked to return if new or different flashes or floaters develops.   Patient was instructed to contact office immediately if any changes were noticed. I explained to the patient that vitreous inside the eye is similar to jello inside a bowl. As the jello melts it can start to pull away from the bowl, similarly the vitreous throughout our lives can begin to pull away from the retina. That process is called a posterior vitreous detachment. In some cases, the vitreous can tug hard enough  on the retina to form a retinal tear. I discussed with the patient the signs and symptoms of a retinal detachment.  Do not rub the eye.      ICD-10-CM   1. Nonproliferative diabetic retinopathy of right eye ([place])  E11.3291 OCT, Retina - OU - Both Eyes  2. Nonproliferative diabetic retinopathy of left eye (HCC)  S28.7681 OCT, Retina - OU - Both Eyes  3. Posterior vitreous detachment of both eyes  H43.813   4. Moderate nonproliferative diabetic retinopathy of right eye without macular edema associated with type 2  diabetes mellitus ([place])  L57.2620   5. Moderate nonproliferative diabetic retinopathy of left eye without macular edema associated with type 2 diabetes mellitus ([place])  B55.9741     1.  2.  3.  Ophthalmic Meds Ordered this visit:  No orders of the defined types were placed in this encounter.      Return in about 1 year (around 10/16/2021) for DILATE OU, COLOR FP.  There are no Patient Instructions on file for this visit.   Explained the diagnoses, plan, and follow up with the patient and they expressed understanding.  Patient expressed understanding of the importance of proper follow up care.   Clent Demark Rankin M.D. Diseases & Surgery of the Retina and Vitreous Retina & Diabetic [place] 10/16/20     Abbreviations: M myopia (nearsighted); A astigmatism; H hyperopia (farsighted); P presbyopia; Mrx spectacle prescription;  CTL contact lenses; OD right eye; OS left eye; OU both eyes  XT exotropia; ET esotropia; PEK punctate epithelial keratitis; PEE punctate epithelial erosions; DES dry eye syndrome; MGD meibomian gland dysfunction; ATs artificial tears; PFAT's preservative free artificial tears; [place] nuclear sclerotic cataract; PSC posterior subcapsular cataract; ERM epi-retinal membrane; PVD posterior vitreous detachment; RD retinal detachment; DM diabetes mellitus; DR diabetic retinopathy; NPDR non-proliferative diabetic retinopathy; PDR proliferative diabetic retinopathy; CSME clinically significant macular edema; DME diabetic macular edema; dbh dot blot hemorrhages; CWS cotton wool spot; POAG primary open angle glaucoma; C/D cup-to-disc ratio; HVF humphrey visual field; GVF goldmann visual field; OCT optical coherence tomography; IOP intraocular pressure; BRVO Branch retinal vein occlusion; CRVO central retinal vein occlusion; CRAO central retinal artery occlusion; BRAO branch retinal artery occlusion; RT retinal tear; SB scleral buckle; PPV pars plana vitrectomy; VH Vitreous  hemorrhage; PRP panretinal laser photocoagulation; IVK intravitreal kenalog; VMT vitreomacular traction; MH Macular hole;  NVD neovascularization of the disc; NVE neovascularization elsewhere; AREDS age related eye disease study; ARMD age related macular degeneration; POAG primary open angle glaucoma; EBMD epithelial/anterior basement membrane dystrophy; ACIOL anterior chamber intraocular lens; IOL intraocular lens; PCIOL posterior chamber intraocular lens; Phaco/IOL phacoemulsification with intraocular lens placement; [place] photorefractive keratectomy; LASIK laser assisted in situ keratomileusis; HTN hypertension; DM diabetes mellitus; COPD chronic obstructive pulmonary disease

## 2020-10-17 ENCOUNTER — Other Ambulatory Visit: Payer: Self-pay | Admitting: Family Medicine

## 2020-10-17 DIAGNOSIS — I10 Essential (primary) hypertension: Secondary | ICD-10-CM

## 2020-10-18 NOTE — Telephone Encounter (Signed)
This was done for 6 months on 10/25.  She should have just gotten a 90d refill in the last week or so.  She has an appt 11/2020 with me.  I can refill her meds in March, no need to refill it 2 mos early.

## 2020-10-18 NOTE — Telephone Encounter (Signed)
Is this okay to refill? 

## 2020-10-19 NOTE — Telephone Encounter (Signed)
Pt will have to check and see if she needs a refill and will let us know. She thinks she just got a refill on it

## 2020-10-22 LAB — HM PAP SMEAR: HM Pap smear: NEGATIVE

## 2020-11-20 NOTE — Progress Notes (Signed)
Chief Complaint  Patient presents with  . Hypertension    Fasting med check. Drinking water for urine. Continues to have shoulder and groin pain, states you said it is because of osteoporosis.(she cannot sleep because of these pains) Feels like she has noticed some bad breath-checked with dentist and it is not dental. Has a vein right upper side if head she would like you to look at-wants to make sure it's nothing.    Patient presents for 6 month follow-up on chronic problems.  She had COVID-19 infection in late December. Symptoms were mild, and she has fully recovered.  She has R shoulder pain, chronic, has seen ortho, who told her she was too old to operate on. She had an injury from lifting something many years ago (thinks rotator cuff). She got 3 cortisone injections, didn't really help.  She uses blue emu cream, which helps.  She takes 2 tylenol twice daily, which helps.    She has pain at R groin. This hasn't gotten worse, about the same.  X-rays from 03/2018 showed severe right hip degenerative changes.  She has seen ortho for hip and shoulders, though didn't have the greatest experience at last visit. Considering switching to another orthopedist, but doesn't feel the need right now.  She reports that her ears feels like she is in a tunnel.  She is using some OTC ear drops, which helps temporarily.  Hearing sounds muffled.  Wants to make sure there isn't a wax accumulation.  Osteoporosis:DEXA waslast donein3/2021, T-1.4 L radius. This is improved since starting Prolia in 2019. She is tolerating Prolia without side effects. Last injection was 06/2020. She continues to take Calcium with D,and drinks 1 glass of milk and has cheese. She is also takingVitamin D3 1000 IU daily. She continues to take MVI(contains400 IU).She had a normal Vitamin D level in 11/2017 (45.4).  She isn't getting any weight-bearing exercise, due to pain.  Hypertension: She reports compliance with taking  amlodipine, carvedilol, HCTZ(and klorcon),andlosartan. Denies any lightheadedness, dizziness, chest pain, palpitations. Occasional headaches, not related to her blood pressure.  She gets muscle cramps in her legs at night.  She takes a teaspoon of mustard, when needed, and that helps.  Needs to take this about once a week. Blood pressure has been running 114/60, 132/54, 130/53, 121/54.  She lost weight doing Rickard Patience, and noted improvement in her BP's with weight loss. She stopped doing the program, got to where she wanted.  She has gained just a few pounds back Wt Readings from Last 3 Encounters:  09/11/20 186 lb (84.4 kg)  05/24/20 183 lb (83 kg)  11/14/19 197 lb (89.4 kg)    Diabetes: Controlled by diet, she has never taken medications.  Sugar are running <100 (3 values since 2/11 were 89, 94, 95. Lastdiabeticeye exam was 10/2020, has nonproliferative diabetic retinopathy. Denies polydipsia, polyuria (continues to void 3x/night). She has numbnessin her left foot since her minor stroke, unchanged. She denies any burning or sores on her feet. Lab Results  Component Value Date   HGBA1C 5.2 05/24/2020    OAB--She had been goingtoDr. McDiarmid's officemonthly for transcutaneous nerve stimulationtreatments.Shehad stopped goinglast year because the office was too crowded. She never restarted this, not interested. She continues to see him once a year, he prescribes Vesicare. She started taking Ultra-nol total bladder support last year, thought it helped some, but she ran out.  Continues to have bladder prolapse (pessaries didn't work well, would fall out).   Hyperlipidemia-- Jamesetta Geralds  fish oil capsule that she found that does not have red dye, 103m, [id number] to follow a low fat diet. She limits her carbs, sweets, fried foods. Snacks on cheese. Compliant with Zetia.Lipids were at goal on last check, though HDL was low. Lab Results  Component Value Date    CHOL 140 05/24/2020   HDL 35 (L) 05/24/2020   LDLCALC 80 05/24/2020   TRIG 138 05/24/2020   CHOLHDL 4.0 05/24/2020    Memory concerns:Shecontinues to takePrevagen, and she finds it effective. She notices improvement--easier to recall names.She denies any change in the last year.  H/o CVA, on Plavix. She denies any bleeding.+bruising on hands/forearms. She has residual numbness of L foot. Also felt to have a mild peripheral neuropathy, possibly related to her diabetes (diet controlled).She remains on gabapentin (started by Dr. MJana Hakimin the past). She denies any pain in her foot, just a mild tingling/numbness, unchanged. She no longer sees Dr. WJannifer Franklin  Gout: She hasn't had a flare in many years. She previously took allopurinol,, but hasn't had anyflares since stopping it. Last check of uric acid had been OFF the allopurinol, and remained normal. Lab Results  Component Value Date   LABURIC 4.8 02/16/2017    PMH, PSH, SH reviewed  Outpatient Encounter Medications as of 11/21/2020  Medication Sig Note  . Acetaminophen (TYLENOL ARTHRITIS PAIN PO) Take 2 tablets by mouth 2 (two) times daily.    .Marland KitchenamLODipine (NORVASC) 5 MG tablet TAKE 1 TABLET BY MOUTH EVERY DAY   . Apoaequorin (PREVAGEN PO) Take 1 tablet by mouth daily.   . Ascorbic Acid (VITAMIN C) 500 MG CAPS Take 1 tablet by mouth daily.   . Blood Glucose Monitoring Suppl (ACCU-CHEK GUIDE) w/Device KIT 1 each by Does not apply route 2 (two) times daily.   . Calcium Carbonate-Vitamin D (CALTRATE 600+D PO) Take 2 tablets by mouth daily.    . carvedilol (COREG) 25 MG tablet Take 1.5 tablets (37.5 mg total) by mouth 2 (two) times daily with a meal.   . cholecalciferol (VITAMIN D) 1000 units tablet Take 1,000 Units by mouth daily.   . clopidogrel (PLAVIX) 75 MG tablet TAKE 1 TABLET BY MOUTH EVERY DAY   . Coenzyme Q10 (CO Q 10 PO) Take 1 tablet by mouth daily.    .Marland Kitchenezetimibe (ZETIA) 10 MG tablet TAKE 1 TABLET BY MOUTH EVERY DAY    . FREESTYLE LITE test strip TEST BLOOD SUGAR TWICE DAILY   . gabapentin (NEURONTIN) 300 MG capsule Take 1 capsule (300 mg total) by mouth at bedtime.   . hydrochlorothiazide (HYDRODIURIL) 12.5 MG tablet TAKE 1 TABLET BY MOUTH EVERY DAY   . KLOR-CON M10 10 MEQ tablet TAKE 1 TABLET BY MOUTH EVERY DAY   . Lancets Micro Thin 33G MISC 1 each by Does not apply route 2 (two) times daily.   .Marland Kitchenlosartan (COZAAR) 100 MG tablet TAKE 1 TABLET BY MOUTH EVERY DAY   . Misc Natural Products (OSTEO BI-FLEX ADV TRIPLE ST PO) Take 2 capsules by mouth daily.   . Multiple Vitamin (MULTIVITAMIN) tablet Take 1 tablet by mouth daily.   . Omega-3 Fatty Acids (FISH OIL) 1000 MG CAPS Take 1 capsule by mouth 2 (two) times daily. 12/07/2017: Reservatrol 2 daily-omega plus  . solifenacin (VESICARE) 5 MG tablet Take 5 mg by mouth daily.   .Marland KitchenSpecialty Vitamins Products ([employer] HAIR/SKIN/NAILS PO) Take 2 each by mouth daily.   . TURMERIC PO Take 1 tablet by mouth daily.   .Marland Kitchen  Zinc 50 MG CAPS Take 1 capsule by mouth daily.   . cephALEXin (KEFLEX) 500 MG capsule Take 1 tablet prior to dental procedure, take second tablet evening after dental procedure (Patient not taking: Reported on 11/21/2020)   . nitrofurantoin, macrocrystal-monohydrate, (MACROBID) 100 MG capsule Take 1 capsule (100 mg total) by mouth 2 (two) times daily. (Patient not taking: No sig reported)   . NON FORMULARY Take 1 capsule by mouth at bedtime. (Patient not taking: Reported on 11/21/2020) 11/14/2019: Ultra-nol bladder support  . [DISCONTINUED] potassium chloride (K-DUR) 10 MEQ tablet TAKE 1 TABLET BY MOUTH EVERY DAY    Facility-Administered Encounter Medications as of 11/21/2020  Medication  . denosumab (PROLIA) injection 60 mg   Allergies  Allergen Reactions  . Ciprofloxacin Nausea And Vomiting  . Codeine Nausea And Vomiting  . Contrast Media [Iodinated Diagnostic Agents] Nausea And Vomiting  . Morphine And Related Nausea And Vomiting  . Statins Other (See  Comments)    lethargic  . Toprol Xl [Metoprolol Tartrate]     Symptoms of heart attack, per pt, told not to take it again (?chest pain)  . Clindamycin/Lincomycin Itching and Rash  . Penicillins Hives, Swelling and Rash    Has patient had a PCN reaction causing immediate rash, facial/tongue/throat swelling, SOB or lightheadedness with hypotension: no Has patient had a PCN reaction causing severe rash ivolving mucus membranes or skin necrosis: no Has patient had a PCN reaction that required hospitalization no Has patient had a PCN reaction occurring within the last 10 years: no If all of the above answers are "NO", then may proceed with Cephalosporin use.  . Prednisone Palpitations    ROS: no fever, chills, URI symptoms, dizziness, chest pain, shortness of breath. Occasional headache. No nausea, vomiting or heartburn. She gets diarrhea after certain foods (onions, corn); no blood or mucus in the stool. Urinary complaints per HPI--has bladder prolapse, occasionally gets discomfort with sitting.  Up 3x/night, and frequently during the day. Moods are good. Right shoulder and R hip pain per HPI.   PHYSICAL EXAM:  BP 140/80   Pulse (!) 56   Ht $R'5\' 5"'HJ$  (1.651 m)   Wt 187 lb 3.2 oz (84.9 kg)   BMI 31.15 kg/m   Wt Readings from Last 3 Encounters:  11/21/20 187 lb 3.2 oz (84.9 kg)  09/11/20 186 lb (84.4 kg)  05/24/20 183 lb (83 kg)   Pleasant, well-appearing female in no distress HEENT: conjunctiva and sclera are clear, EOMI. Wearing mask. TM's normal. Neck: no lymphadenopathy, thyromegaly or carotid bruit Heart: regular rate and rhythm Lungs: clear bilaterally, no wheezes, rales, ronchi Back: no spinal or CVA tenderness Abdomen: soft, nontender, no mass. No suprapubic tenderness Extremities: some swelling at ankles R>L, nonpitting.  No pretibial edema.  2+ pulses. Psych: normal mood, affect, hygiene and grooming.  She is in good spirits, talkative Neuro: alert and oriented, normal  strength Skin: She has some purpura and ecchymosis on her hands and forearms.    Lab Results  Component Value Date   HGBA1C 5.2 11/21/2020    ASSESSMENT/PLAN:  Controlled type 2 diabetes mellitus with mild nonproliferative retinopathy of both eyes, without long-term current use of insulin, macular edema presence unspecified (HCC) - cont low carb diet. Encouraged daily exercise, wt loss - Plan: HgB A1c, Comprehensive metabolic panel, TSH, Microalbumin / creatinine urine ratio  Essential hypertension, benign - well controlled - Plan: Comprehensive metabolic panel  Osteoporosis without current pathological fracture, unspecified osteoporosis type - cont Prolia. Discussed Ca,  D, weight-bearing exercise  Mixed hyperlipidemia  History of membranous glomerulonephritis - due to see nephro next month (Dr. Hollie Salk, Dr. Sanda Klein replacement)  Nonproliferative diabetic retinopathy [facility]) - saw Dr. Zadie Rhine last month  Diet-controlled diabetes mellitus ([place])  Senile purpura ([place]) - on hands/forearms - Plan: CBC with Differential/Platelet  Medication monitoring encounter - Plan: Comprehensive metabolic panel, CBC with Differential/Platelet  Osteoarthritis of right hip, unspecified osteoarthritis type - She will let us know if interested in referral to ortho.  Wt loss encouraged.  Chronic right shoulder pain - prior injury, poss RC.  Didn't get much benefit from injections in past.  She will let us know if interested in referral to new ortho. Declined PT   A1c Cbc, c-met, TSH, urine microalb   Due to see Dr. Hollie Salk (Dr. Sanda Klein replacement) in 12/2020--already scheduled.  R shoulder pain--discussed likely degenerative/arthritic, poss underlying rotator injury.  Not interested in going back to ortho for further injections, or PT.  Happy with what she is doing. (not from osteoporosis). We discussed poss referral to a different ortho (unhappy with last visit at [facility], Dr. Lorine Bears  to PO). She will let us know if/when she is interested, for either shoulder and/or hip pain.  I encourage you to continue to try and lose some more weight--specifically from the waist, as this should help with your hip pain, and be better for your heart.

## 2020-11-21 ENCOUNTER — Encounter: Payer: Self-pay | Admitting: Family Medicine

## 2020-11-21 ENCOUNTER — Ambulatory Visit (INDEPENDENT_AMBULATORY_CARE_PROVIDER_SITE_OTHER): Payer: Medicare PPO | Admitting: Family Medicine

## 2020-11-21 ENCOUNTER — Other Ambulatory Visit: Payer: Self-pay

## 2020-11-21 VITALS — BP 140/80 | HR 56 | Ht 65.0 in | Wt 187.2 lb

## 2020-11-21 DIAGNOSIS — Z87448 Personal history of other diseases of urinary system: Secondary | ICD-10-CM

## 2020-11-21 DIAGNOSIS — M810 Age-related osteoporosis without current pathological fracture: Secondary | ICD-10-CM | POA: Diagnosis not present

## 2020-11-21 DIAGNOSIS — D692 Other nonthrombocytopenic purpura: Secondary | ICD-10-CM

## 2020-11-21 DIAGNOSIS — Z5181 Encounter for therapeutic drug level monitoring: Secondary | ICD-10-CM | POA: Diagnosis not present

## 2020-11-21 DIAGNOSIS — M1611 Unilateral primary osteoarthritis, right hip: Secondary | ICD-10-CM

## 2020-11-21 DIAGNOSIS — E113293 Type 2 diabetes mellitus with mild nonproliferative diabetic retinopathy without macular edema, bilateral: Secondary | ICD-10-CM

## 2020-11-21 DIAGNOSIS — I10 Essential (primary) hypertension: Secondary | ICD-10-CM | POA: Diagnosis not present

## 2020-11-21 DIAGNOSIS — G8929 Other chronic pain: Secondary | ICD-10-CM

## 2020-11-21 DIAGNOSIS — M25511 Pain in right shoulder: Secondary | ICD-10-CM

## 2020-11-21 DIAGNOSIS — E782 Mixed hyperlipidemia: Secondary | ICD-10-CM | POA: Diagnosis not present

## 2020-11-21 DIAGNOSIS — E113299 Type 2 diabetes mellitus with mild nonproliferative diabetic retinopathy without macular edema, unspecified eye: Secondary | ICD-10-CM

## 2020-11-21 DIAGNOSIS — E119 Type 2 diabetes mellitus without complications: Secondary | ICD-10-CM | POA: Diagnosis not present

## 2020-11-21 LAB — POCT GLYCOSYLATED HEMOGLOBIN (HGB A1C): Hemoglobin A1C: 5.2 % (ref 4.0–5.6)

## 2020-11-21 NOTE — Patient Instructions (Addendum)
  I encourage you to continue to try and lose some more weight--specifically from the waist, as this should help with your hip pain, and be better for your heart.  Your pain in your shoulder is likely related to injury--degenerative changes and possible rotator cuff injury in the past.  If it is bothering you, you can either follow up with your orthopedist for another trial of an injection, or consider physical therapy.  Let us know if you are interested in seeing another orthopedist for your shoulder or hip pain.

## 2020-11-22 LAB — CBC WITH DIFFERENTIAL/PLATELET
Basophils Absolute: 0.1 10*3/uL (ref 0.0–0.2)
Basos: 1 %
EOS (ABSOLUTE): 0.2 10*3/uL (ref 0.0–0.4)
Eos: 4 %
Hematocrit: 40.1 % (ref 34.0–46.6)
Hemoglobin: 13.7 g/dL (ref 11.1–15.9)
Immature Grans (Abs): 0 10*3/uL (ref 0.0–0.1)
Immature Granulocytes: 0 %
Lymphocytes Absolute: 2.8 10*3/uL (ref 0.7–3.1)
Lymphs: 46 %
MCH: 33.1 pg — ABNORMAL HIGH (ref 26.6–33.0)
MCHC: 34.2 g/dL (ref 31.5–35.7)
MCV: 97 fL (ref 79–97)
Monocytes Absolute: 0.6 10*3/uL (ref 0.1–0.9)
Monocytes: 10 %
Neutrophils Absolute: 2.4 10*3/uL (ref 1.4–7.0)
Neutrophils: 39 %
Platelets: 180 10*3/uL (ref 150–450)
RBC: 4.14 x10E6/uL (ref 3.77–5.28)
RDW: 12 % (ref 11.7–15.4)
WBC: 6.1 10*3/uL (ref 3.4–10.8)

## 2020-11-22 LAB — COMPREHENSIVE METABOLIC PANEL
ALT: 10 IU/L (ref 0–32)
AST: 16 IU/L (ref 0–40)
Albumin/Globulin Ratio: 1.5 (ref 1.2–2.2)
Albumin: 4.1 g/dL (ref 3.6–4.6)
Alkaline Phosphatase: 36 IU/L — ABNORMAL LOW (ref 44–121)
BUN/Creatinine Ratio: 30 — ABNORMAL HIGH (ref 12–28)
BUN: 26 mg/dL (ref 8–27)
Bilirubin Total: 0.9 mg/dL (ref 0.0–1.2)
CO2: 27 mmol/L (ref 20–29)
Calcium: 10.5 mg/dL — ABNORMAL HIGH (ref 8.7–10.3)
Chloride: 101 mmol/L (ref 96–106)
Creatinine, Ser: 0.86 mg/dL (ref 0.57–1.00)
Globulin, Total: 2.8 g/dL (ref 1.5–4.5)
Glucose: 98 mg/dL (ref 65–99)
Potassium: 3.8 mmol/L (ref 3.5–5.2)
Sodium: 142 mmol/L (ref 134–144)
Total Protein: 6.9 g/dL (ref 6.0–8.5)
eGFR: 65 mL/min/{1.73_m2} (ref >59)

## 2020-11-22 LAB — MICROALBUMIN / CREATININE URINE RATIO
Creatinine, Urine: 50.1 mg/dL
Microalb/Creat Ratio: 116 mg/g creat — ABNORMAL HIGH (ref 0–29)
Microalbumin, Urine: 58 ug/mL

## 2020-11-22 LAB — TSH: TSH: 1.72 u[IU]/mL (ref 0.450–4.500)

## 2020-11-23 ENCOUNTER — Other Ambulatory Visit: Payer: Self-pay | Admitting: Family Medicine

## 2020-11-23 DIAGNOSIS — Z8673 Personal history of transient ischemic attack (TIA), and cerebral infarction without residual deficits: Secondary | ICD-10-CM

## 2020-11-23 DIAGNOSIS — I10 Essential (primary) hypertension: Secondary | ICD-10-CM

## 2020-12-02 ENCOUNTER — Other Ambulatory Visit: Payer: Self-pay | Admitting: Family Medicine

## 2020-12-02 DIAGNOSIS — I10 Essential (primary) hypertension: Secondary | ICD-10-CM

## 2020-12-02 DIAGNOSIS — E7800 Pure hypercholesterolemia, unspecified: Secondary | ICD-10-CM

## 2021-01-10 ENCOUNTER — Other Ambulatory Visit: Payer: Self-pay | Admitting: Family Medicine

## 2021-01-10 DIAGNOSIS — Z8673 Personal history of transient ischemic attack (TIA), and cerebral infarction without residual deficits: Secondary | ICD-10-CM

## 2021-01-10 DIAGNOSIS — I10 Essential (primary) hypertension: Secondary | ICD-10-CM

## 2021-01-11 ENCOUNTER — Other Ambulatory Visit (INDEPENDENT_AMBULATORY_CARE_PROVIDER_SITE_OTHER): Payer: Medicare PPO

## 2021-01-11 ENCOUNTER — Other Ambulatory Visit: Payer: Self-pay

## 2021-01-11 DIAGNOSIS — Z23 Encounter for immunization: Secondary | ICD-10-CM | POA: Diagnosis not present

## 2021-01-11 DIAGNOSIS — M810 Age-related osteoporosis without current pathological fracture: Secondary | ICD-10-CM | POA: Diagnosis not present

## 2021-01-11 MED ORDER — DENOSUMAB 60 MG/ML ~~LOC~~ SOSY
60.0000 mg | PREFILLED_SYRINGE | Freq: Once | SUBCUTANEOUS | Status: AC
  Administered 2021-01-11: 60 mg via SUBCUTANEOUS

## 2021-01-30 DIAGNOSIS — N390 Urinary tract infection, site not specified: Secondary | ICD-10-CM | POA: Diagnosis not present

## 2021-01-30 DIAGNOSIS — N8110 Cystocele, unspecified: Secondary | ICD-10-CM | POA: Diagnosis not present

## 2021-01-30 DIAGNOSIS — N022 Recurrent and persistent hematuria with diffuse membranous glomerulonephritis: Secondary | ICD-10-CM | POA: Diagnosis not present

## 2021-01-30 DIAGNOSIS — M810 Age-related osteoporosis without current pathological fracture: Secondary | ICD-10-CM | POA: Diagnosis not present

## 2021-01-30 DIAGNOSIS — I129 Hypertensive chronic kidney disease with stage 1 through stage 4 chronic kidney disease, or unspecified chronic kidney disease: Secondary | ICD-10-CM | POA: Diagnosis not present

## 2021-03-05 ENCOUNTER — Other Ambulatory Visit: Payer: Self-pay | Admitting: Family Medicine

## 2021-03-05 DIAGNOSIS — I10 Essential (primary) hypertension: Secondary | ICD-10-CM

## 2021-04-25 ENCOUNTER — Other Ambulatory Visit: Payer: Self-pay | Admitting: Family Medicine

## 2021-04-25 DIAGNOSIS — E7800 Pure hypercholesterolemia, unspecified: Secondary | ICD-10-CM

## 2021-04-29 ENCOUNTER — Other Ambulatory Visit: Payer: Self-pay | Admitting: Family Medicine

## 2021-04-29 DIAGNOSIS — Z1231 Encounter for screening mammogram for malignant neoplasm of breast: Secondary | ICD-10-CM

## 2021-05-03 DIAGNOSIS — N3944 Nocturnal enuresis: Secondary | ICD-10-CM | POA: Diagnosis not present

## 2021-05-03 DIAGNOSIS — N3000 Acute cystitis without hematuria: Secondary | ICD-10-CM | POA: Diagnosis not present

## 2021-05-04 ENCOUNTER — Other Ambulatory Visit: Payer: Self-pay | Admitting: Family Medicine

## 2021-05-04 DIAGNOSIS — I10 Essential (primary) hypertension: Secondary | ICD-10-CM

## 2021-05-15 ENCOUNTER — Telehealth: Payer: Self-pay | Admitting: Family Medicine

## 2021-05-15 NOTE — Telephone Encounter (Signed)
Pt called and left a message stating that she has changed pharmacy. She now uses [facility] on [address] in [place] for all meds.

## 2021-05-15 NOTE — Telephone Encounter (Signed)
Updated pharmacy in patient's chart.

## 2021-06-16 ENCOUNTER — Other Ambulatory Visit: Payer: Self-pay | Admitting: Family Medicine

## 2021-06-17 ENCOUNTER — Ambulatory Visit: Payer: Medicare PPO

## 2021-06-19 ENCOUNTER — Ambulatory Visit: Payer: Medicare PPO | Admitting: Family Medicine

## 2021-06-19 DIAGNOSIS — E113293 Type 2 diabetes mellitus with mild nonproliferative diabetic retinopathy without macular edema, bilateral: Secondary | ICD-10-CM | POA: Insufficient documentation

## 2021-06-19 NOTE — Progress Notes (Signed)
Chief Complaint  Patient presents with   Medicare Wellness    Fasting AWV/CPE. Needs parking placard form filled out-right hip is bothering her. Pain is worsening, taking Tylenol and wants to know if she should take anything else. Health food store recommended full spectrum Hemp Oil 1560m (active cannabidoil) is this okay to take for pain? Mammo was scheduled for Monday but she didn't have a car-she is going to r/s.   Burning with Urination    Has been experiencing burning with urination x several months.    Kristin BINNINGis a 87y.o. female who presents for annual physical exam, Medicare wellness visit and follow-up on chronic medical conditions.   She has the following concerns:  Brings in handicap form (uses walker, severe hip pain). Some decrease/muffling in hearing, wants ears checked, wax removed.  Osteoporosis:  DEXA was last done in 11/2019, T-1.4 L radius. This is improved since starting Prolia in 2019. She is tolerating Prolia without side effects. Last injection was 12/2020. She continues to take Calcium with D, and drinks 1 glass of milk and has cheese. She is also taking Vitamin D3 1000 IU daily. She continues to take MVI  (contains 400 IU). She had a normal Vitamin D level in 11/2017 (45.4).  She isn't getting any weight-bearing exercise, due to pain.  She has known severe R hip arthritis, last x-rays were 03/2018.  She was told she couldn't have surgery (due to risks of anesthesia).   She hasn't seen an orthopedist for her hip; unhappy with prior ortho (Dr. CTamera Punt, asking for referral. Interested in nonsurgical treatments.  Asking about pain meds.   Hypertension:  She reports compliance with taking carvedilol, HCTZ (and potassium), and losartan. Her amlodipine was stopped in 01/2021 by Dr. UHollie Salk(nephro--pt requested to decrease meds).  Denies any headaches, lightheadedness, dizziness, chest pain, palpitations. She hasn't been getting many muscle cramps lately (used to, and  would need mustard).   She admits that she hasn't been checking her BP at home.  Today she is having some hip pain and didn't sleep well.   She is having pain in her right groin, extending all the way down the medial thigh to the knee.  She also has R shoulder pain. Isn't sleeping well in bed, sometimes sleeps in her chair.  Obesity:  She lost weight doing JRickard Patience stopped doing the program when she got to where she wanted.  She had gained some weight back. She reports eating very little. She gained some because her family is making her eat more. She is very inactive.  Wt Readings from Last 3 Encounters:  06/20/21 189 lb 6.4 oz (85.9 kg)  11/21/20 187 lb 3.2 oz (84.9 kg)  09/11/20 186 lb (84.4 kg)     Diabetes:  Controlled by diet, she has never taken medications.  Sugars are running <100, checked infrequently (once a week). Diabetic eye exams are UTD (in 10/2020 noted to have nonproliferative diabetic retinopathy.) Denies polydipsia, polyuria (continues to void 3x/night).  She has numbness in her left foot since her minor stroke, unchanged. She denies any burning or sores on her feet.   OAB--She had been going to Dr. McDiarmid's office monthly for transcutaneous nerve stimulation treatments. She had stopped going for these treatments due to the crowds in the office, and didn't want to restart.  She continues to see him once a year, he prescribes Vesicare.  Continues to have bladder prolapse (pessaries didn't work well, would fall out).  She  last saw the urologist last month, and mentioned her urinay complaints. Urine was checked, no treatment was recommended. She also mentioned it to Dr. Nori Riis, who prescribed a cream, which didn't help much. Burning starts just prior to voiding, and some frequency. Denies any odor to the urine.   Hyperlipidemia-- She is taking a fish oil capsule that she found that does not have red dye, 1084m, taking 2 daily. Trying to follow a low fat diet. She limits her  carbs, sweets, fried foods. Snacks on cheese. Compliant with Zetia. Lipids were at goal on last check, though HDL was low. She is due for recheck. Lab Results  Component Value Date   CHOL 140 05/24/2020   HDL 35 (L) 05/24/2020   LDLCALC 80 05/24/2020   TRIG 138 05/24/2020   CHOLHDL 4.0 05/24/2020     Memory concerns: She continues to take Prevagen, and she finds it effective.  She notices improvement--easier to recall names.  She writes things down, keeps lists, and denies any significant problems.   H/o CVA, on Plavix. She denies any bleeding. +bruising on hands/forearms. She has residual numbness of L foot. Also felt to have a mild peripheral neuropathy, possibly related to her diabetes (diet controlled).  She remains on gabapentin (started by Dr. MJana Hakimin the past).  She denies any pain in her foot, just a mild numbness in the L foot, unchanged. She no longer sees Dr. WJannifer Franklin   History of Membranous nephropathy.  Dr. DLorrene Reidretired and she now is seeing Dr. UHollie Salk  She was treated with Cipro for a UTI in 01/2021.  At that time, her amlodipine was stopped, with goal of maintaining BP <130/80. She hasn't been monitoring BP.    Immunization History  Administered Date(s) Administered   Fluad Quad(high Dose 65+) 05/24/2020, 06/20/2021   Influenza Split 06/14/2012, 06/09/2013, 05/30/2014   Influenza Whole 07/06/2002, 06/28/2008, 06/25/2011   Influenza, High Dose Seasonal PF 05/11/2017, 07/06/2018, 05/03/2019   Influenza-Unspecified 05/31/2015, 05/27/2016   PFIZER Comirnaty(Gray Top)Covid-19 Tri-Sucrose Vaccine 01/11/2021   PFIZER(Purple Top)SARS-COV-2 Vaccination 10/12/2019, 11/09/2019, 08/17/2020   Pfizer Covid-19 Vaccine Bivalent Booster 112yr& up 06/20/2021   Pneumococcal Conjugate-13 01/09/2014   Pneumococcal Polysaccharide-23 04/05/2007, 02/16/2017   Tdap 04/05/2007, 01/02/2017   Zoster Recombinat (Shingrix) 07/06/2018, 05/03/2019   Zoster, Live 09/15/2004   Last Pap smear:   08/2019, normal (h/o endometrial CA, under care of Dr. NeNori Riis, has had since then per pt (not received) Last mammogram: 06/2020; required biopsy, which showed fibrosis and benign breast cysts. Due now (had to cancel recent appt due to transportation issue, will r/s) Last colonoscopy: 01/2014--tubular adenoma.  F/u not recommended due to age, per Dr. BuCristina Gongast DEXA: 11/2019 T-1.4 at left radius Dentist: sees Dr. LuEssie Hartvery 4 months and reports they do all of her cleanings, x-rays, etc. Ophtho: Dr. RaZadie Rhineearly Exercise: Very limited, just housework, and just some walking. Uses bands occasionally. Vit D-OH 38 in 07/2015, 45.4 in 12/2017.   Patient Care Team: KnRita OharaMD as PCP - General (Family Medicine) NeMaisie FusMD as Attending Physician (Obstetrics and Gynecology) UpMadelon LipsMD as Consulting Physician (Nephrology) WiKathrynn DuckingMD as Consulting Physician (Neurology) MaBjorn LoserMD as Consulting Physician (Urology) RaZadie RhineaClent DemarkMD as Consulting Physician (Ophthalmology) LuChelsea AusDDS as Consulting Physician (Dentistry) BuRonald LoboMD as Consulting Physician (Gastroenterology) PrEvelina BucyDPM as Consulting Physician (Podiatry) ChMalena CatholicMD as Referring Physician Dr. ScSabino DonovanDDS (Dentistry) Dr. StTeofilo PodMD (Dermatology)   Depression  Screening: [place] Office Visit from 06/20/2021 in [place]  PHQ-2 Total Score 0       Falls screen:  Fall Risk  06/20/2021 11/21/2020 05/24/2020 05/12/2019 04/05/2018  Falls in the past year? 0 1 - 0 Yes  Number falls in past yr: 0 0 1 - 1  Injury with Fall? 0 0 0 - No  Comment - fell making the bed-no injury - - -  Risk for fall due to : No Fall Risks - - - -  Risk for fall due to: Comment - - - - -  Follow up Falls evaluation completed - - - -     Functional Status Survey: Is the patient deaf or have difficulty hearing?: Yes (has hearing aide but doesn't work  well) Does the patient have difficulty seeing, even when wearing glasses/contacts?: No Does the patient have difficulty concentrating, remembering, or making decisions?: Yes (some memory) Does the patient have difficulty walking or climbing stairs?: Yes Does the patient have difficulty dressing or bathing?: No Does the patient have difficulty doing errands alone such as visiting a doctor's office or shopping?: No  Pain in R hip and balance isn't good, affecting her walking. She uses a walker. Son built her a ramp, put in railings.  Mini-Cog Scoring: 5    End of Life Discussion:  Patient has a living will and medical power of attorney.   PMH, PSH, SH and FH reviewed and updated  Outpatient Encounter Medications as of 06/20/2021  Medication Sig Note   Acetaminophen (TYLENOL ARTHRITIS PAIN PO) Take 2 tablets by mouth 2 (two) times daily.     Apoaequorin (PREVAGEN PO) Take 1 tablet by mouth daily.    Ascorbic Acid (VITAMIN C) 500 MG CAPS Take 1 tablet by mouth daily.    Blood Glucose Monitoring Suppl (ACCU-CHEK GUIDE) w/Device KIT 1 each by Does not apply route 2 (two) times daily.    Calcium Carbonate-Vitamin D (CALTRATE 600+D PO) Take 2 tablets by mouth daily.     carvedilol (COREG) 25 MG tablet TAKE 1.5 TABLETS (37.5 MG TOTAL) BY MOUTH 2 (TWO) TIMES DAILY WITH A MEAL.    cholecalciferol (VITAMIN D) 1000 units tablet Take 1,000 Units by mouth daily.    clopidogrel (PLAVIX) 75 MG tablet TAKE 1 TABLET BY MOUTH EVERY DAY    Coenzyme Q10 (CO Q 10 PO) Take 1 tablet by mouth daily.     ezetimibe (ZETIA) 10 MG tablet TAKE 1 TABLET BY MOUTH EVERY DAY    FREESTYLE LITE test strip TEST BLOOD SUGAR TWICE DAILY    gabapentin (NEURONTIN) 300 MG capsule TAKE 1 CAPSULE BY MOUTH EVERYDAY AT BEDTIME    hydrochlorothiazide (HYDRODIURIL) 12.5 MG tablet TAKE 1 TABLET BY MOUTH EVERY DAY    KLOR-CON M10 10 MEQ tablet TAKE 1 TABLET BY MOUTH EVERY DAY    Lancets Micro Thin 33G MISC 1 each by Does not apply  route 2 (two) times daily.    losartan (COZAAR) 100 MG tablet TAKE 1 TABLET BY MOUTH EVERY DAY    Misc Natural Products (OSTEO BI-FLEX ADV TRIPLE ST PO) Take 2 capsules by mouth daily.    Multiple Vitamin (MULTIVITAMIN) tablet Take 1 tablet by mouth daily.    Omega-3 Fatty Acids (FISH OIL) 1000 MG CAPS Take 1 capsule by mouth 2 (two) times daily. 12/07/2017: Reservatrol 2 daily-omega plus   solifenacin (VESICARE) 5 MG tablet Take 5 mg by mouth daily.    Specialty Vitamins Products ([employer] HAIR/SKIN/NAILS PO)  Take 2 each by mouth daily.    TURMERIC PO Take 1 tablet by mouth daily.    Zinc 50 MG CAPS Take 1 capsule by mouth daily.    [DISCONTINUED] NON FORMULARY Take 1 capsule by mouth at bedtime. 11/14/2019: Ultra-nol bladder support   amLODipine (NORVASC) 5 MG tablet TAKE 1 TABLET BY MOUTH EVERY DAY (Patient not taking: Reported on 06/20/2021) 06/20/2021: This was stopped by Dr. Hollie Salk   cephALEXin [facility]) 500 MG capsule Take 1 tablet prior to dental procedure, take second tablet evening after dental procedure (Patient not taking: No sig reported)    nitrofurantoin, macrocrystal-monohydrate, (MACROBID) 100 MG capsule Take 1 capsule (100 mg total) by mouth 2 (two) times daily. (Patient not taking: No sig reported)    [DISCONTINUED] hydrochlorothiazide (MICROZIDE) 12.5 MG capsule TAKE 1 CAPLET BY MOUTH EVERYDAY    [DISCONTINUED] potassium chloride (K-DUR) 10 MEQ tablet TAKE 1 TABLET BY MOUTH EVERY DAY    Facility-Administered Encounter Medications as of 06/20/2021  Medication   denosumab (PROLIA) injection 60 mg   Allergies  Allergen Reactions   Ciprofloxacin Nausea And Vomiting   Codeine Nausea And Vomiting   Contrast Media [Iodinated Diagnostic Agents] Nausea And Vomiting   Morphine And Related Nausea And Vomiting   Statins Other (See Comments)    lethargic   Toprol Xl [Metoprolol Tartrate]     Symptoms of heart attack, per pt, told not to take it again (?chest pain)   Clindamycin/Lincomycin  Itching and Rash   Penicillins Hives, Swelling and Rash    Has patient had a PCN reaction causing immediate rash, facial/tongue/throat swelling, SOB or lightheadedness with hypotension: no Has patient had a PCN reaction causing severe rash ivolving mucus membranes or skin necrosis: no Has patient had a PCN reaction that required hospitalization no Has patient had a PCN reaction occurring within the last 10 years: no If all of the above answers are "NO", then may proceed with Cephalosporin use.   Prednisone Palpitations     ROS:  The patient denies anorexia, fever, headaches,  vision changes, ear pain (just some muffling and decreased hearing; plans to get new hearing aids), sore throat, breast concerns, chest pain, palpitations, dizziness, syncope, dyspnea on exertion, cough, swelling, nausea, vomiting, constipation, abdominal pain, melena, hematochezia, indigestion/heartburn, hematuria, dysuria, vaginal bleeding, discharge, odor or itch, genital lesions, numbness (just residual from stroke), tingling, weakness, tremor, suspicious skin lesions, depression, anxiety, abnormal bleeding or enlarged lymph nodes. Incontinence (wears pads, overall controlled with medication). Up 3x/night to void Frozen right shoulder, limited range of motion. Topical medications are used to treat mild discomfort. Occasional R knee pain/arthritis L hip has been bothering her a lot recently. Diarrhea if she eats anything with onions, uses imodium prn. Burning with urination per HPI.  Diarrhea with certain foods (onions),  Family is making her eat more, she used to only eat lunch, has gained some weight   PHYSICAL EXAM:  BP (!) 154/90   Pulse 60   Ht 5' 3.5" (1.613 m)   Wt 189 lb 6.4 oz (85.9 kg)   BMI 33.02 kg/m   Wt Readings from Last 3 Encounters:  06/20/21 189 lb 6.4 oz (85.9 kg)  11/21/20 187 lb 3.2 oz (84.9 kg)  09/11/20 186 lb (84.4 kg)    General Appearance:    Alert, cooperative, no distress,  appears stated age. Patient elected not to change into gown for visit.  Head:    Normocephalic, without obvious abnormality, atraumatic  Eyes:    PERRL,  conjunctiva/corneas clear, EOM's intact, fundi benign  Ears:    Normal TM's and external ear canals. She had cerumen obstructing R TM; this was treated with lavage, and exam of TM was normal after cerumen removal   Nose:   Not examined (wearing mask due to COVID-19 pandemic)  Throat:   Not examined (wearing mask due to COVID-19 pandemic)  Neck:   Supple, no lymphadenopathy;  thyroid:  no enlargement/ tenderness/nodules; no carotid bruit or JVD. No spinal tenderness.  Back:    Spine nontender, no curvature, ROM normal, no CVA  tenderness. Mildly tender at R lower lumbar paraspinous muscles, no spasm.  Lungs:     Clear to auscultation bilaterally without wheezes, rales or   ronchi; respirations unlabored  Chest Wall:    No tenderness or deformity   Heart:    Regular rate and rhythm, S1 and S2 normal, no murmur, rub   or gallop  Breast Exam:    Deferred to GYN  Abdomen:     Soft, non-tender, nondistended, normoactive bowel sounds,    no masses, no hepatosplenomegaly. Easily reducible, small umbilical hernia is present.  Genitalia:    Deferred to GYN       Extremities:   No clubbing, cyanosis. Trace edema at ankles bilaterally. Hyperpigmentation of lower shins. Discolored/thickened toenails Callouses on the tip of L 2nd-4th toes, otherwise normal. Normal monofilament sensation.  Pulses:   2+ and symmetric all extremities  Skin:   Skin color, texture, turgor normal, no rashes or lesions. Purpuric lesions and bruising noted on lower legs and forearms. Exam is limited as she wasn't in a gown.   Lymph nodes:   Cervical, supraclavicular nodes normal  Neurologic:   Normal strength, sensation and gait; reflexes 2+ and symmetric throughout                             Psych:   Normal mood, affect, hygiene and grooming.   Lab Results  Component Value  Date   HGBA1C 5.3 06/20/2021   Urine dip: trace protein, mod blood, trace leuks, SG 1.015   ASSESSMENT/PLAN:  Annual physical exam  Medicare annual wellness visit, subsequent  Controlled type 2 diabetes mellitus with mild nonproliferative retinopathy of both eyes, without long-term current use of insulin, macular edema presence unspecified ([place]) - cont yearly eye exams.  Cont low sugar/carb diet.  Wt loss rec  Essential hypertension, benign - BP above goal.  In pain and didn't sleep well. Monitor at home and sent Dr. Hollie Salk results--may need to restart amlodipine if remains >130/80 - Plan: Comprehensive metabolic panel  Osteoporosis without current pathological fracture, unspecified osteoporosis type - Discussed Ca, D, weight-bearing exercise.  Prolia due the end of the month  History of membranous glomerulonephritis - in remission. under the care of Dr. Hollie Salk  Diet-controlled diabetes mellitus ([place]) - Plan: HgB A1c  Mixed hyperlipidemia - on Zetia and fish oil. Due for recheck. Intolerant of statins - Plan: Lipid panel  Need for influenza vaccination - Plan: Flu Vaccine QUAD High Dose(Fluad)  Need for COVID-19 vaccine - Plan: Pension scheme manager  Burning with urination - suspect component of atrophic vaginitis and related to prolapse - Plan: POCT Urinalysis DIP (Proadvantage Device)  Osteoarthritis of right hip, unspecified osteoarthritis type - refer to ortho to discuss nonsurgical treatment options (trial of injection?) Cont tylenol - Plan: Ambulatory referral to Orthopedic Surgery  Medication monitoring encounter - Plan: Comprehensive metabolic panel, Lipid  panel  Impacted cerumen of right ear - resolved s/p lavage, with normal ear exam upon removal   Discussed monthly self breast exams and yearly mammograms; at least 30 minutes of aerobic activity at least 5 days/week and weight-bearing exercise 2x/week; proper sunscreen use reviewed; healthy diet,  including goals of calcium and vitamin D intake and alcohol recommendations (less than or equal to 1 drink/day) reviewed; regular seatbelt use; changing batteries in smoke detectors.  Immunization recommendations discussed--continue yearly high dose flu shots, given today.  Bivalent COVID vaccine given today. Colonoscopy recommendations reviewed, due again 2020 based on pathology from polyp, but not needed due to age, since not having any GI problems.    Full Code, Full care MOST form discussed and completed.  F/u 6 months for med check  Medicare Attestation I have personally reviewed: The patient's medical and social history Their use of alcohol, tobacco or illicit drugs Their current medications and supplements The patient's functional ability including ADLs,fall risks, home safety risks, cognitive, and hearing and visual impairment Diet and physical activities Evidence for depression or mood disorders  The patient's weight, height, BMI have been recorded in the chart.  I have made referrals, counseling, and provided education to the patient based on review of the above and I have provided the patient with a written personalized care plan for preventive services.

## 2021-06-19 NOTE — Patient Instructions (Addendum)
  HEALTH MAINTENANCE RECOMMENDATIONS:  It is recommended that you get at least 30 minutes of aerobic exercise at least 5 days/week (for weight loss, you may need as much as 60-90 minutes). This can be any activity that gets your heart rate up. This can be divided in 10-15 minute intervals if needed, but try and build up your endurance at least once a week.  Weight bearing exercise is also recommended twice weekly.  Eat a healthy diet with lots of vegetables, fruits and fiber.  "Colorful" foods have a lot of vitamins (ie green vegetables, tomatoes, red peppers, etc).  Limit sweet tea, regular sodas and alcoholic beverages, all of which has a lot of calories and sugar.  Up to 1 alcoholic drink daily may be beneficial for women (unless trying to lose weight, watch sugars).  Drink a lot of water.  Calcium recommendations are 1200-1500 mg daily (1500 mg for postmenopausal women or women without ovaries), and vitamin D 1000 IU daily.  This should be obtained from diet and/or supplements (vitamins), and calcium should not be taken all at once, but in divided doses.  Monthly self breast exams and yearly mammograms for women over the age of 28 is recommended.  Sunscreen of at least SPF 30 should be used on all sun-exposed parts of the skin when outside between the hours of 10 am and 4 pm (not just when at beach or pool, but even with exercise, golf, tennis, and yard work!)  Use a sunscreen that says "broad spectrum" so it covers both UVA and UVB rays, and make sure to reapply every 1-2 hours.  Remember to change the batteries in your smoke detectors when changing your clock times in the spring and fall. Carbon monoxide detectors are recommended for your home.  Use your seat belt every time you are in a car, and please drive safely and not be distracted with cell phones and texting while driving.   Kristin Willis , Thank you for taking time to come for your Medicare Wellness Visit. I appreciate your ongoing  commitment to your health goals. Please review the following plan we discussed and let me know if I can assist you in the future.   This is a list of the screening recommended for you and due dates:  Health Maintenance  Topic Date Due   Flu Shot  04/15/2021   COVID-19 Vaccine (5 - Booster for Pfizer series) 05/13/2021   Complete foot exam   05/24/2021   Hemoglobin A1C  05/24/2021   Eye exam for diabetics  10/16/2021   Tetanus Vaccine  01/03/2027   DEXA scan (bone density measurement)  Completed   Zoster (Shingles) Vaccine  Completed   HPV Vaccine  Aged Out    Mammogram is due later this month (06/2021).  Foot exam, flu shot and A1c were performed today.  Kristin Willis will be in contact when your next Prolia is due (at the end of the month)  I agree with eating small snacks/meals throughout the day, rather than just 1 meal daily, as this helps keep your metabolism up. Weight loss is strongly encouraged, to help with hip and knee arthritis. Be sure to get plenty of protein and vegetables, and minimize the carb portions.  Please monitor your blood pressure regularly at home, and contact Dr. Hollie Salk with the values.  Goal is to be under 130/80.  If they remain higher, she may want to restart the amlodipine.

## 2021-06-20 ENCOUNTER — Ambulatory Visit: Payer: Medicare PPO | Admitting: Family Medicine

## 2021-06-20 ENCOUNTER — Other Ambulatory Visit: Payer: Self-pay

## 2021-06-20 ENCOUNTER — Encounter: Payer: Self-pay | Admitting: Family Medicine

## 2021-06-20 VITALS — BP 154/90 | HR 60 | Ht 63.5 in | Wt 189.4 lb

## 2021-06-20 DIAGNOSIS — E119 Type 2 diabetes mellitus without complications: Secondary | ICD-10-CM

## 2021-06-20 DIAGNOSIS — E782 Mixed hyperlipidemia: Secondary | ICD-10-CM

## 2021-06-20 DIAGNOSIS — E7800 Pure hypercholesterolemia, unspecified: Secondary | ICD-10-CM

## 2021-06-20 DIAGNOSIS — H6121 Impacted cerumen, right ear: Secondary | ICD-10-CM | POA: Diagnosis not present

## 2021-06-20 DIAGNOSIS — Z87448 Personal history of other diseases of urinary system: Secondary | ICD-10-CM

## 2021-06-20 DIAGNOSIS — Z23 Encounter for immunization: Secondary | ICD-10-CM | POA: Diagnosis not present

## 2021-06-20 DIAGNOSIS — M1611 Unilateral primary osteoarthritis, right hip: Secondary | ICD-10-CM

## 2021-06-20 DIAGNOSIS — I10 Essential (primary) hypertension: Secondary | ICD-10-CM | POA: Diagnosis not present

## 2021-06-20 DIAGNOSIS — E113293 Type 2 diabetes mellitus with mild nonproliferative diabetic retinopathy without macular edema, bilateral: Secondary | ICD-10-CM

## 2021-06-20 DIAGNOSIS — R300 Dysuria: Secondary | ICD-10-CM | POA: Diagnosis not present

## 2021-06-20 DIAGNOSIS — Z5181 Encounter for therapeutic drug level monitoring: Secondary | ICD-10-CM

## 2021-06-20 DIAGNOSIS — Z0000 Encounter for general adult medical examination without abnormal findings: Secondary | ICD-10-CM

## 2021-06-20 DIAGNOSIS — M810 Age-related osteoporosis without current pathological fracture: Secondary | ICD-10-CM

## 2021-06-20 LAB — POCT GLYCOSYLATED HEMOGLOBIN (HGB A1C): Hemoglobin A1C: 5.3 % (ref 4.0–5.6)

## 2021-06-20 LAB — POCT URINALYSIS DIP (PROADVANTAGE DEVICE)
Bilirubin, UA: NEGATIVE
Glucose, UA: NEGATIVE mg/dL
Ketones, POC UA: NEGATIVE mg/dL
Nitrite, UA: NEGATIVE
Specific Gravity, Urine: 1.015
Urobilinogen, Ur: NEGATIVE
pH, UA: 6 (ref 5.0–8.0)

## 2021-06-21 ENCOUNTER — Encounter: Payer: Self-pay | Admitting: Family Medicine

## 2021-06-21 LAB — COMPREHENSIVE METABOLIC PANEL
ALT: 11 IU/L (ref 0–32)
AST: 18 IU/L (ref 0–40)
Albumin/Globulin Ratio: 1.5 (ref 1.2–2.2)
Albumin: 4 g/dL (ref 3.5–4.6)
Alkaline Phosphatase: 36 IU/L — ABNORMAL LOW (ref 44–121)
BUN/Creatinine Ratio: 28 (ref 12–28)
BUN: 24 mg/dL (ref 10–36)
Bilirubin Total: 0.9 mg/dL (ref 0.0–1.2)
CO2: 26 mmol/L (ref 20–29)
Calcium: 10.2 mg/dL (ref 8.7–10.3)
Chloride: 101 mmol/L (ref 96–106)
Creatinine, Ser: 0.85 mg/dL (ref 0.57–1.00)
Globulin, Total: 2.7 g/dL (ref 1.5–4.5)
Glucose: 100 mg/dL — ABNORMAL HIGH (ref 70–99)
Potassium: 3.8 mmol/L (ref 3.5–5.2)
Sodium: 141 mmol/L (ref 134–144)
Total Protein: 6.7 g/dL (ref 6.0–8.5)
eGFR: 65 mL/min/{1.73_m2} (ref >59)

## 2021-06-21 LAB — LIPID PANEL
Chol/HDL Ratio: 3.9 ratio (ref 0.0–4.4)
Cholesterol, Total: 147 mg/dL (ref 100–199)
HDL: 38 mg/dL — ABNORMAL LOW (ref >39)
LDL Chol Calc (NIH): 85 mg/dL (ref 0–99)
Triglycerides: 133 mg/dL (ref 0–149)
VLDL Cholesterol Cal: 24 mg/dL (ref 5–40)

## 2021-06-21 MED ORDER — EZETIMIBE 10 MG PO TABS: 10.0000 mg | ORAL_TABLET | Freq: Every day | ORAL | 3 refills | Status: DC

## 2021-06-21 NOTE — Addendum Note (Signed)
Addended by: Rita Ohara on: 06/21/2021 07:36 AM   Modules accepted: Orders

## 2021-07-02 ENCOUNTER — Ambulatory Visit: Payer: Medicare PPO | Admitting: Orthopaedic Surgery

## 2021-07-02 ENCOUNTER — Ambulatory Visit: Payer: Self-pay

## 2021-07-02 ENCOUNTER — Other Ambulatory Visit: Payer: Self-pay

## 2021-07-02 DIAGNOSIS — M1611 Unilateral primary osteoarthritis, right hip: Secondary | ICD-10-CM | POA: Insufficient documentation

## 2021-07-02 DIAGNOSIS — M19011 Primary osteoarthritis, right shoulder: Secondary | ICD-10-CM

## 2021-07-02 NOTE — Progress Notes (Signed)
Office Visit Note   Patient: Kristin Willis           Date of Birth: 28-Dec-1928           MRN: [id number] Visit Date: 07/02/2021              Requested by: Rita Ohara, [address] [place],  [place] [address] PCP: Rita Ohara, MD   Assessment & Plan: Visit Diagnoses:  1. Primary osteoarthritis of right shoulder   2. Primary osteoarthritis of right hip     Plan: Impression is end-stage right shoulder and right hip DJD.  Unfortunately she is 87 years old with medical comorbidities and has been she is also not interested in undergoing joint replacement surgery at this time due to concern about her age and the ability to recover from the surgery.  I tend to agree with her and her family that surgery is not a great idea.  We will make a referral to Dr. Ernestina Patches to try cortisone injections for the right hip and shoulder joint.  We will see her back as needed.  Follow-Up Instructions: No follow-ups on file.   Orders:  Orders Placed This Encounter  Procedures   XR Shoulder Right   XR HIP UNILAT W OR W/O PELVIS 2-3 VIEWS RIGHT   Ambulatory referral to Physical Medicine Rehab   No orders of the defined types were placed in this encounter.     Procedures: No procedures performed   Clinical Data: No additional findings.   Subjective: Chief Complaint  Patient presents with   Right Hip - New Patient (Initial Visit)    Kristin Willis is a very pleasant 87 year old female here with her daughter and [place] daughter for evaluation of right hip and groin pain as well as right shoulder pain.  She reports 10 out of 10 pain to both the shoulder and the hip.  She is very limited in terms of mobility.  Mainly ambulates in a wheelchair.  She is unable to take NSAIDs due to chronic glomerulonephritis.  She denies any previous surgeries to the shoulder or the hip.   Review of Systems  Constitutional: Negative.   HENT: Negative.    Eyes: Negative.   Respiratory: Negative.     Cardiovascular: Negative.   Endocrine: Negative.   Musculoskeletal: Negative.   Neurological: Negative.   Hematological: Negative.   Psychiatric/Behavioral: Negative.    All other systems reviewed and are negative.   Objective: Vital Signs: There were no vitals taken for this visit.  Physical Exam Vitals and nursing note reviewed.  Constitutional:      Appearance: She is well-developed.  HENT:     Head: Normocephalic and atraumatic.  Pulmonary:     Effort: Pulmonary effort is normal.  Abdominal:     Palpations: Abdomen is soft.  Musculoskeletal:     Cervical back: Neck supple.  Skin:    General: Skin is warm.     Capillary Refill: Capillary refill takes less than 2 seconds.  Neurological:     Mental Status: She is alert and oriented to person, place, and time.  Psychiatric:        Behavior: Behavior normal.        Thought Content: Thought content normal.        Judgment: Judgment normal.    Ortho Exam  Right shoulder exam shows moderate limitation in passive and active range of motion secondary to pain.  Strength slightly limited secondary to pain.  No neurovascular compromise of the  extremity.  Right hip shows severe pain with flexion of the hip past 90 degrees.  Positive FADIR sign at 0 degrees.  Positive logroll.  Specialty Comments:  No specialty comments available.  Imaging: XR HIP UNILAT W OR W/O PELVIS 2-3 VIEWS RIGHT  Result Date: 07/02/2021 Advanced hip DJD.  XR Shoulder Right  Result Date: 07/02/2021 Advanced glenohumeral DJD.    PMFS History: Patient Active Problem List   Diagnosis Date Noted   Primary osteoarthritis of right shoulder 07/02/2021   Primary osteoarthritis of right hip 07/02/2021   Controlled type 2 diabetes mellitus with mild nonproliferative retinopathy of both eyes, without long-term current use of insulin ([place]) 06/19/2021   Moderate nonproliferative diabetic retinopathy of left eye (HCC) 10/16/2020   Moderate  nonproliferative diabetic retinopathy of right eye ([place]) 10/16/2020   Posterior vitreous detachment of both eyes 10/16/2020   Nonproliferative diabetic retinopathy ([place]) 02/15/2017   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   Abnormality of gait 07/21/2016   Senile purpura ([place]) 08/07/2015   Mixed hyperlipidemia 01/29/2015   Microalbuminuria 01/10/2014   Urge incontinence 01/10/2014   History of stroke 10/24/2013   PAD (peripheral artery disease) ([place]) 07/11/2013   Peripheral neuropathy 01/10/2013   Osteoporosis 06/21/2012   Breast cancer ([place]) 09/01/2011   Gout 08/06/2011   Pure hypercholesterolemia 03/05/2011   DM (diabetes mellitus), type 2 with peripheral vascular complications ([place]) 71/24/5809   Essential hypertension, benign 03/05/2011   Past Medical History:  Diagnosis Date   Abnormality of gait 07/21/2016   Arthritis    right knee, left wrist   Asthma    related to allergies when working; resolved   Breast cancer ([place]) 7/08 Inrasine ductal L breast   Colon polyps    4/09--adenomatous. 04/2010--normal.  Repeat 2014   DDD (degenerative disc disease), lumbar 4/06   Diabetes mellitus    diet controlled   Diabetic retinopathy    DJD (degenerative joint disease)    Dyslipidemia    Endometrial cancer ([facility]) hx   Gout    Hepatitis A history(in [place]-1950's)   History of stroke 10/24/2013   Hypertension    Incontinence    Membranous glomerulonephropathy    [name]   Mini stroke DrWillis   Obesity, unspecified    Osteopenia L hip, 4/06   Ramsay Hunt cerebellar syndrome ([facility]) 07/21/2016   Skin cancer    [name]   Sliding hiatal hernia small sliding hiatal hernia   Wears glasses     Family History  Problem Relation Age of Onset   Heart attack Father    Diabetes Father    Heart attack Mother    Hypertension Mother    Diabetes Mother    Hypertension Brother    Stroke Brother    Melanoma Brother    Diabetes Brother    Hypertension Son    Alcohol  abuse Son    Stroke Paternal Grandmother    Diabetes Paternal Grandfather    Vision loss Paternal Grandfather        related to diabetes   Cancer Neg Hx     Past Surgical History:  Procedure Laterality Date   ABDOMINAL HYSTERECTOMY     endometrial cancer   APPENDECTOMY     BREAST LUMPECTOMY Left 8/08 L Breast ([name])   CATARACT EXTRACTION     COLONOSCOPY  8/08 [name] (normal)   kidney stones     left middle finger     REPLACEMENT TOTAL KNEE  Left   SKIN CANCER EXCISION  TOTAL KNEE ARTHROPLASTY     left   Social History   Occupational History   Occupation: retired  Tobacco Use   Smoking status: Never   Smokeless tobacco: Never  Vaping Use   Vaping Use: Never used  Substance and Sexual Activity   Alcohol use: No   Drug use: No   Sexual activity: Not Currently

## 2021-07-04 ENCOUNTER — Telehealth: Payer: Self-pay | Admitting: Family Medicine

## 2021-07-04 ENCOUNTER — Telehealth: Payer: Self-pay | Admitting: Physical Medicine and Rehabilitation

## 2021-07-04 NOTE — Telephone Encounter (Signed)
Called pt and she did not answer. I need to speak to her concerning her next Prolia injection.

## 2021-07-04 NOTE — Telephone Encounter (Signed)
Pt called stating she needs to r/s her 07/22/21 appt.  [phone]

## 2021-07-05 ENCOUNTER — Telehealth: Payer: Self-pay | Admitting: Physical Medicine and Rehabilitation

## 2021-07-05 NOTE — Telephone Encounter (Signed)
Patient call advised she is going to keep her appointment on 07/22/2021. Patient said she do not need to change the appointment and she will be there.

## 2021-07-09 NOTE — Telephone Encounter (Signed)
Patient will keep appointment.

## 2021-07-15 ENCOUNTER — Other Ambulatory Visit: Payer: Self-pay

## 2021-07-15 ENCOUNTER — Other Ambulatory Visit (INDEPENDENT_AMBULATORY_CARE_PROVIDER_SITE_OTHER): Payer: Medicare PPO

## 2021-07-15 DIAGNOSIS — M810 Age-related osteoporosis without current pathological fracture: Secondary | ICD-10-CM

## 2021-07-15 MED ORDER — DENOSUMAB 60 MG/ML ~~LOC~~ SOSY
60.0000 mg | PREFILLED_SYRINGE | Freq: Once | SUBCUTANEOUS | Status: AC
  Administered 2021-07-15: 60 mg via SUBCUTANEOUS

## 2021-07-22 ENCOUNTER — Ambulatory Visit (INDEPENDENT_AMBULATORY_CARE_PROVIDER_SITE_OTHER): Payer: Medicare PPO | Admitting: Physical Medicine and Rehabilitation

## 2021-07-22 ENCOUNTER — Ambulatory Visit: Payer: Self-pay

## 2021-07-22 ENCOUNTER — Other Ambulatory Visit: Payer: Self-pay

## 2021-07-22 DIAGNOSIS — M25511 Pain in right shoulder: Secondary | ICD-10-CM

## 2021-07-22 DIAGNOSIS — M25551 Pain in right hip: Secondary | ICD-10-CM | POA: Diagnosis not present

## 2021-07-22 DIAGNOSIS — G8929 Other chronic pain: Secondary | ICD-10-CM | POA: Diagnosis not present

## 2021-07-22 NOTE — Progress Notes (Signed)
Pt state right shoulder and hip pain. Pt state any movement make the pain worse. Pt state she takes pain meds to help ease her pain.  Numeric Pain Rating Scale and Functional Assessment Average Pain 6   In the last MONTH (on 0-10 scale) has pain interfered with the following?  1. General activity like being  able to carry out your everyday physical activities such as walking, climbing stairs, carrying groceries, or moving a chair?  Rating(10)   +Driver, +BT, -Dye Allergies.

## 2021-07-28 MED ORDER — TRIAMCINOLONE ACETONIDE 40 MG/ML IJ SUSP
60.0000 mg | INTRAMUSCULAR | Status: AC | PRN
Start: 2021-07-22 — End: 2021-07-22
  Administered 2021-07-22: 60 mg via INTRA_ARTICULAR

## 2021-07-28 MED ORDER — BUPIVACAINE HCL 0.25 % IJ SOLN
5.0000 mL | INTRAMUSCULAR | Status: AC | PRN
Start: 2021-07-22 — End: 2021-07-22
  Administered 2021-07-22: 5 mL via INTRA_ARTICULAR

## 2021-07-28 MED ORDER — BUPIVACAINE HCL 0.25 % IJ SOLN
4.0000 mL | INTRAMUSCULAR | Status: AC | PRN
  Administered 2021-07-22: 4 mL via INTRA_ARTICULAR

## 2021-07-28 MED ORDER — TRIAMCINOLONE ACETONIDE 40 MG/ML IJ SUSP
40.0000 mg | INTRAMUSCULAR | Status: AC | PRN
  Administered 2021-07-22: 40 mg via INTRA_ARTICULAR

## 2021-07-28 NOTE — Progress Notes (Signed)
   Kristin Willis - 87 y.o. female MRN [id number]  Date of birth: 10-07-1928  Office Visit Note: Visit Date: 07/22/2021 PCP: Rita Ohara, MD Referred by: Rita Ohara, MD  Subjective: Chief Complaint  Patient presents with   Right Shoulder - Pain   Right Hip - Pain   HPI:  Kristin Willis is a 87 y.o. female who comes in today at the request of Dr. Eduard Roux for planned Right anesthetic hip arthrogram with fluoroscopic guidance and also right glenohumeral joint anesthetic arthrogram with fluoroscopic guidance.  The patient has failed conservative care including home exercise, medications, time and activity modification.  This injections will be diagnostic and hopefully therapeutic.  Please see requesting physician notes for further details and justification.   ROS Otherwise per HPI.  Assessment & Plan: Visit Diagnoses:    ICD-10-CM   1. Chronic right shoulder pain  M25.511 XR C-ARM NO REPORT   G89.29     2. Pain in right hip  M25.551 XR C-ARM NO REPORT      Plan: No additional findings.   Meds & Orders: No orders of the defined types were placed in this encounter.   Orders Placed This Encounter  Procedures   Large Joint Inj   Large Joint Inj   XR C-ARM NO REPORT    Follow-up: Return in about 4 weeks (around 08/19/2021) for  Eduard Roux, MD.   Procedures: Large Joint Inj: R hip joint on 07/22/2021 2:30 PM Indications: diagnostic evaluation and pain Details: 22 G 3.5 in needle, fluoroscopy-guided anterior approach  Arthrogram: No  Medications: 4 mL bupivacaine 0.25 %; 60 mg triamcinolone acetonide 40 MG/ML Outcome: tolerated well, no immediate complications  There was excellent flow of contrast producing a partial arthrogram of the hip. The patient did have relief of symptoms during the anesthetic phase of the injection. Procedure, treatment alternatives, risks and benefits explained, specific risks discussed. Consent was given by the patient. Immediately prior to procedure  a time out was called to verify the correct patient, procedure, equipment, support staff and site/side marked as required. Patient was prepped and draped in the usual sterile fashion.    Large Joint Inj: R glenohumeral on 07/22/2021 2:30 PM Indications: pain and diagnostic evaluation Details: 22 G 3.5 in needle, fluoroscopy-guided anteromedial approach  Arthrogram: No  Medications: 40 mg triamcinolone acetonide 40 MG/ML; 5 mL bupivacaine 0.25 % Outcome: tolerated well, no immediate complications  There was excellent flow of contrast producing a partial arthrogram of the glenohumeral joint. The patient did have relief of symptoms during the anesthetic phase of the injection. Procedure, treatment alternatives, risks and benefits explained, specific risks discussed. Consent was given by the patient. Immediately prior to procedure a time out was called to verify the correct patient, procedure, equipment, support staff and site/side marked as required. Patient was prepped and draped in the usual sterile fashion.         Clinical History: No specialty comments available.     Objective:  VS:  HT:    WT:   BMI:     BP:   HR: bpm  TEMP: ( )  RESP:  Physical Exam   Imaging: No results found.

## 2021-07-31 ENCOUNTER — Other Ambulatory Visit: Payer: Self-pay | Admitting: Family Medicine

## 2021-07-31 DIAGNOSIS — I10 Essential (primary) hypertension: Secondary | ICD-10-CM

## 2021-08-01 DIAGNOSIS — N022 Recurrent and persistent hematuria with diffuse membranous glomerulonephritis: Secondary | ICD-10-CM | POA: Diagnosis not present

## 2021-08-01 DIAGNOSIS — I129 Hypertensive chronic kidney disease with stage 1 through stage 4 chronic kidney disease, or unspecified chronic kidney disease: Secondary | ICD-10-CM | POA: Diagnosis not present

## 2021-08-01 DIAGNOSIS — M810 Age-related osteoporosis without current pathological fracture: Secondary | ICD-10-CM | POA: Diagnosis not present

## 2021-08-01 DIAGNOSIS — N390 Urinary tract infection, site not specified: Secondary | ICD-10-CM | POA: Diagnosis not present

## 2021-08-01 LAB — MICROALBUMIN / CREATININE URINE RATIO: Microalb Creat Ratio: 143

## 2021-08-06 ENCOUNTER — Ambulatory Visit: Payer: Medicare PPO

## 2021-08-07 ENCOUNTER — Other Ambulatory Visit: Payer: Self-pay | Admitting: Family Medicine

## 2021-08-07 DIAGNOSIS — I10 Essential (primary) hypertension: Secondary | ICD-10-CM

## 2021-08-14 ENCOUNTER — Telehealth: Payer: Self-pay

## 2021-08-14 NOTE — Telephone Encounter (Signed)
Pt called and would like to know if she can get an appt set up with Dr. Ernestina Patches for a hip and shoulder injection.  Please advise

## 2021-09-04 ENCOUNTER — Ambulatory Visit
Admission: RE | Admit: 2021-09-04 | Discharge: 2021-09-04 | Disposition: A | Discharge status: Home / Self Care | Payer: Medicare PPO | Source: Ambulatory Visit | Attending: Family Medicine | Admitting: Family Medicine

## 2021-09-04 DIAGNOSIS — Z1231 Encounter for screening mammogram for malignant neoplasm of breast: Secondary | ICD-10-CM

## 2021-09-11 ENCOUNTER — Other Ambulatory Visit: Payer: Self-pay | Admitting: Family Medicine

## 2021-09-11 ENCOUNTER — Other Ambulatory Visit: Payer: Self-pay

## 2021-09-11 DIAGNOSIS — I10 Essential (primary) hypertension: Secondary | ICD-10-CM

## 2021-09-11 MED ORDER — HYDROCHLOROTHIAZIDE 12.5 MG PO TABS: 12.5000 mg | ORAL_TABLET | Freq: Every day | ORAL | 0 refills | Status: DC

## 2021-09-11 NOTE — Telephone Encounter (Signed)
Okay for the gabapentin as pended. No idea what the doxy is for. Someone will need to contact her to see why she is requesting this (likely will need a visit for any need for an antibiotic)

## 2021-10-01 ENCOUNTER — Ambulatory Visit: Payer: Medicare PPO | Admitting: Orthopaedic Surgery

## 2021-10-01 ENCOUNTER — Other Ambulatory Visit: Payer: Self-pay

## 2021-10-01 DIAGNOSIS — M1611 Unilateral primary osteoarthritis, right hip: Secondary | ICD-10-CM

## 2021-10-01 DIAGNOSIS — M19011 Primary osteoarthritis, right shoulder: Secondary | ICD-10-CM

## 2021-10-01 NOTE — Progress Notes (Signed)
Office Visit Note   Patient: Kristin Willis           Date of Birth: 02-10-1929           MRN: [id number] Visit Date: 10/01/2021              Requested by: Kristin Willis, [address] [place],   [id number] PCP: Kristin Ohara, MD   Assessment & Plan: Visit Diagnoses:  1. Primary osteoarthritis of right shoulder   2. Primary osteoarthritis of right hip     Plan: Kristin Willis returns today to follow-up for right shoulder and right hip DJD.  She states that the previous injections gave her about 3 weeks of relief.  She continues to take Tylenol arthritis twice a day.  Right hip and shoulder exams are unchanged and consistent with advanced DJD.  Impression is advanced DJD of right shoulder and right hip.  Again she is not interested in surgery and she states that she is afraid she will never wake up from surgery although this has never been formally evaluated by anesthesiologist.  Certainly we respect her wishes to not have surgery.  She would like to try repeating the cortisone injections even though they may not give much relief.  We will see her back as needed.   Follow-Up Instructions: No follow-ups on file.   Orders:  No orders of the defined types were placed in this encounter.  No orders of the defined types were placed in this encounter.     Procedures: No procedures performed   Clinical Data: No additional findings.   Subjective: Chief Complaint  Patient presents with   Right Shoulder - Pain   Right Hip - Pain    HPI  Review of Systems   Objective: Vital Signs: There were no vitals taken for this visit.  Physical Exam  Ortho Exam  Specialty Comments:  No specialty comments available.  Imaging: No results found.   PMFS History: Patient Active Problem List   Diagnosis Date Noted   Primary osteoarthritis of right shoulder 07/02/2021   Primary osteoarthritis of right hip 07/02/2021   Controlled type 2 diabetes mellitus with mild  nonproliferative retinopathy of both eyes, without long-term current use of insulin ([place]) 06/19/2021   Moderate nonproliferative diabetic retinopathy of left eye (HCC) 10/16/2020   Moderate nonproliferative diabetic retinopathy of right eye ([place]) 10/16/2020   Posterior vitreous detachment of both eyes 10/16/2020   Nonproliferative diabetic retinopathy ([place]) 02/15/2017   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   Abnormality of gait 07/21/2016   Senile purpura ([place]) 08/07/2015   Mixed hyperlipidemia 01/29/2015   Microalbuminuria 01/10/2014   Urge incontinence 01/10/2014   History of stroke 10/24/2013   PAD (peripheral artery disease) ([place]) 07/11/2013   Peripheral neuropathy 01/10/2013   Osteoporosis 06/21/2012   Breast cancer ([facility]) 09/01/2011   Gout 08/06/2011   Pure hypercholesterolemia 03/05/2011   DM (diabetes mellitus), type 2 with peripheral vascular complications ([facility]) 97/67/3419   Essential hypertension, benign 03/05/2011   Past Medical History:  Diagnosis Date   Abnormality of gait 07/21/2016   Arthritis    right knee, left wrist   Asthma    related to allergies when working; resolved   Breast cancer ([place]) 7/08 Inrasine ductal L breast   Colon polyps    4/09--adenomatous. 04/2010--normal.  Repeat 2014   DDD (degenerative disc disease), lumbar 4/06   Diabetes mellitus    diet controlled   Diabetic retinopathy    DJD (degenerative joint disease)  Dyslipidemia    Endometrial cancer ([facility]) hx   Gout    Hepatitis A history(in [place]-1950's)   History of stroke 10/24/2013   Hypertension    Incontinence    Membranous glomerulonephropathy    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   Sliding hiatal hernia small sliding hiatal hernia   Wears glasses     Family History  Problem Relation Age of Onset   Heart attack Father    Diabetes Father     Heart attack Mother    Hypertension Mother    Diabetes Mother    Hypertension Brother    Stroke Brother    Melanoma Brother    Diabetes Brother    Hypertension Son    Alcohol abuse Son    Stroke Paternal Grandmother    Diabetes Paternal Grandfather    Vision loss Paternal Grandfather        related to diabetes   Cancer Neg Hx     Past Surgical History:  Procedure Laterality Date   ABDOMINAL HYSTERECTOMY     endometrial cancer   APPENDECTOMY     BREAST LUMPECTOMY Left 8/08 L Breast ([name])   CATARACT EXTRACTION     COLONOSCOPY  8/08 [name] (normal)   kidney stones     left middle finger     REPLACEMENT TOTAL KNEE  Left   SKIN CANCER EXCISION     TOTAL KNEE ARTHROPLASTY     left   Social History   Occupational History   Occupation: retired  Tobacco Use   Smoking status: Never   Smokeless tobacco: Never  Vaping Use   Vaping Use: Never used  Substance and Sexual Activity   Alcohol use: No   Drug use: No   Sexual activity: Not Currently

## 2021-10-03 ENCOUNTER — Other Ambulatory Visit: Payer: Self-pay

## 2021-10-03 DIAGNOSIS — M19011 Primary osteoarthritis, right shoulder: Secondary | ICD-10-CM

## 2021-10-03 DIAGNOSIS — M1611 Unilateral primary osteoarthritis, right hip: Secondary | ICD-10-CM

## 2021-10-06 ENCOUNTER — Other Ambulatory Visit: Payer: Self-pay | Admitting: Family Medicine

## 2021-10-06 DIAGNOSIS — I10 Essential (primary) hypertension: Secondary | ICD-10-CM

## 2021-10-11 DIAGNOSIS — Z124 Encounter for screening for malignant neoplasm of cervix: Secondary | ICD-10-CM | POA: Diagnosis not present

## 2021-10-11 DIAGNOSIS — Z6830 Body mass index (BMI) 30.0-30.9, adult: Secondary | ICD-10-CM | POA: Diagnosis not present

## 2021-10-12 DIAGNOSIS — W19XXXA Unspecified fall, initial encounter: Secondary | ICD-10-CM | POA: Diagnosis not present

## 2021-10-12 DIAGNOSIS — R0781 Pleurodynia: Secondary | ICD-10-CM | POA: Diagnosis not present

## 2021-10-12 DIAGNOSIS — R1012 Left upper quadrant pain: Secondary | ICD-10-CM | POA: Diagnosis not present

## 2021-10-17 ENCOUNTER — Ambulatory Visit: Payer: Medicare PPO | Admitting: Physical Medicine and Rehabilitation

## 2021-10-21 ENCOUNTER — Ambulatory Visit: Payer: Self-pay

## 2021-10-21 ENCOUNTER — Other Ambulatory Visit: Payer: Self-pay

## 2021-10-21 ENCOUNTER — Ambulatory Visit: Payer: Medicare PPO | Admitting: Physical Medicine and Rehabilitation

## 2021-10-21 ENCOUNTER — Encounter: Payer: Self-pay | Admitting: Physical Medicine and Rehabilitation

## 2021-10-21 DIAGNOSIS — G8929 Other chronic pain: Secondary | ICD-10-CM | POA: Diagnosis not present

## 2021-10-21 DIAGNOSIS — M25511 Pain in right shoulder: Secondary | ICD-10-CM | POA: Diagnosis not present

## 2021-10-21 DIAGNOSIS — M25551 Pain in right hip: Secondary | ICD-10-CM | POA: Diagnosis not present

## 2021-10-21 NOTE — Progress Notes (Signed)
Pt state right hip and shoulder pain. Pt state walking and movement with her arm makes the pain worse. Pt state she takes pain meds to help ease her pain.  Numeric Pain Rating Scale and Functional Assessment Average Pain 2   In the last MONTH (on 0-10 scale) has pain interfered with the following?  1. General activity like being  able to carry out your everyday physical activities such as walking, climbing stairs, carrying groceries, or moving a chair?  Rating(10)   +Driver, +BT, -Dye Allergies.

## 2021-10-21 NOTE — Progress Notes (Signed)
Kristin Willis - 88 y.o. female MRN [id number]  Date of birth: 1929-02-25  Office Visit Note: Visit Date: 10/21/2021 PCP: Rita Ohara, MD Referred by: Rita Ohara, MD  Subjective: Chief Complaint  Patient presents with   Right Hip - Pain   Right Shoulder - Pain   HPI:  Kristin Willis is a 88 y.o. female who comes in today at the request of Dr. Eduard Roux for planned repeat right anesthetic hip and Right glenohumeral arthrogram with fluoroscopic guidance.  The patient has failed conservative care including home exercise, medications, time and activity modification.  This injection will be diagnostic and hopefully therapeutic.  Please see requesting physician notes for further details and justification.  Prior injections diagnostically did give her quite a bit of relief but per Dr. Phoebe Sharps report only lasted a few weeks.  Patient is a poor historian and does not remember how long they last.  Daughter is present today and said it is hard to tell with her how long it seemed to help.  She does have end-stage arthritis really of both joints but the hip looks worse than the shoulder.  We discussed the fact that we can do injections approximately 3 months apart on a consistent basis but really cannot do them any sooner than that on a consistent basis and she will follow-up with Dr. Erlinda Hong.  She does not want any surgery at this point.  She does report on the chart history of contrast media allergy with nausea and vomiting which does not seem typical of that allergy and she did well with prior small amount of contrast for the joint injections.  ROS Otherwise per HPI.  Assessment & Plan: Visit Diagnoses:    ICD-10-CM   1. Pain in right hip  M25.551 XR C-ARM NO REPORT    Large Joint Inj: R hip joint    2. Chronic right shoulder pain  M25.511 XR C-ARM NO REPORT   G89.29 Large Joint Inj: R glenohumeral      Plan: No additional findings.   Meds & Orders: No orders of the defined types were placed in this  encounter.   Orders Placed This Encounter  Procedures   Large Joint Inj: R hip joint   Large Joint Inj: R glenohumeral   XR C-ARM NO REPORT    Follow-up: Return if symptoms worsen or fail to improve, for Eduard Roux, MD.   Procedures: Large Joint Inj: R hip joint on 10/21/2021 2:24 PM Indications: diagnostic evaluation and pain Details: 22 G 3.5 in needle, fluoroscopy-guided anterior approach  Arthrogram: No  Medications: 4 mL bupivacaine 0.25 %; 40 mg triamcinolone acetonide 40 MG/ML Outcome: tolerated well, no immediate complications  There was excellent flow of contrast producing a partial arthrogram of the hip. The patient did have relief of symptoms during the anesthetic phase of the injection. Procedure, treatment alternatives, risks and benefits explained, specific risks discussed. Consent was given by the patient. Immediately prior to procedure a time out was called to verify the correct patient, procedure, equipment, support staff and site/side marked as required. Patient was prepped and draped in the usual sterile fashion.    Large Joint Inj: R glenohumeral on 10/21/2021 2:25 PM Indications: pain and diagnostic evaluation Details: 22 G 3.5 in needle, fluoroscopy-guided anteromedial approach  Arthrogram: No  Medications: 40 mg triamcinolone acetonide 40 MG/ML; 5 mL bupivacaine 0.25 % Outcome: tolerated well, no immediate complications  There was excellent flow of contrast producing a partial arthrogram of the glenohumeral  joint. The patient did have relief of symptoms during the anesthetic phase of the injection. Procedure, treatment alternatives, risks and benefits explained, specific risks discussed. Consent was given by the patient. Immediately prior to procedure a time out was called to verify the correct patient, procedure, equipment, support staff and site/side marked as required. Patient was prepped and draped in the usual sterile fashion.         Clinical  History: No specialty comments available.     Objective:  VS:  HT:     WT:    BMI:      BP:    HR: bpm   TEMP: ( )   RESP:  Physical Exam   Imaging: XR C-ARM NO REPORT  Result Date: 10/21/2021 Please see Notes tab for imaging impression.

## 2021-10-22 ENCOUNTER — Encounter (INDEPENDENT_AMBULATORY_CARE_PROVIDER_SITE_OTHER): Payer: Self-pay | Admitting: Ophthalmology

## 2021-10-22 ENCOUNTER — Ambulatory Visit (INDEPENDENT_AMBULATORY_CARE_PROVIDER_SITE_OTHER): Payer: Medicare PPO | Admitting: Ophthalmology

## 2021-10-22 DIAGNOSIS — H43813 Vitreous degeneration, bilateral: Secondary | ICD-10-CM | POA: Diagnosis not present

## 2021-10-22 DIAGNOSIS — E113291 Type 2 diabetes mellitus with mild nonproliferative diabetic retinopathy without macular edema, right eye: Secondary | ICD-10-CM | POA: Diagnosis not present

## 2021-10-22 LAB — HM DIABETES EYE EXAM

## 2021-10-22 MED ORDER — TRIAMCINOLONE ACETONIDE 40 MG/ML IJ SUSP
40.0000 mg | INTRAMUSCULAR | Status: AC | PRN
  Administered 2021-10-21: 40 mg via INTRA_ARTICULAR

## 2021-10-22 MED ORDER — BUPIVACAINE HCL 0.25 % IJ SOLN
4.0000 mL | INTRAMUSCULAR | Status: AC | PRN
  Administered 2021-10-21: 4 mL via INTRA_ARTICULAR

## 2021-10-22 MED ORDER — BUPIVACAINE HCL 0.25 % IJ SOLN
5.0000 mL | INTRAMUSCULAR | Status: AC | PRN
  Administered 2021-10-21: 5 mL via INTRA_ARTICULAR

## 2021-10-22 NOTE — Progress Notes (Signed)
10/22/2021     CHIEF COMPLAINT Patient presents for  Chief Complaint  Patient presents with   Retina Follow Up      HISTORY OF PRESENT ILLNESS: Kristin Willis is a 88 y.o. female who presents to the clinic today for:   HPI     Retina Follow Up           Diagnosis: Diabetic Retinopathy   Laterality: right eye   Onset: 1 year ago   Severity: mild   Duration: 1 year         Comments   1 yr fu OU oct fp. Patient states vision is stable and unchanged since last visit. Denies any new floaters or FOL.         Last edited by Laurin Coder on 10/22/2021  1:30 PM.      Referring physician: Rita Ohara, [place],  [place] [id number]  HISTORICAL INFORMATION:   Selected notes from the MEDICAL RECORD NUMBER    Lab Results  Component Value Date   HGBA1C 5.3 06/20/2021     CURRENT MEDICATIONS: No current outpatient medications on file. (Ophthalmic Drugs)   No current facility-administered medications for this visit. (Ophthalmic Drugs)   Current Outpatient Medications (Other)  Medication Sig   Acetaminophen (TYLENOL ARTHRITIS PAIN PO) Take 2 tablets by mouth 2 (two) times daily.    amLODipine (NORVASC) 5 MG tablet TAKE 1 TABLET BY MOUTH EVERY DAY   Apoaequorin (PREVAGEN PO) Take 1 tablet by mouth daily.   Ascorbic Acid (VITAMIN C) 500 MG CAPS Take 1 tablet by mouth daily.   Blood Glucose Monitoring Suppl (ACCU-CHEK GUIDE) w/Device KIT 1 each by Does not apply route 2 (two) times daily.   Calcium Carbonate-Vitamin D (CALTRATE 600+D PO) Take 2 tablets by mouth daily.    carvedilol (COREG) 25 MG tablet TAKE 1.5 TABLETS BY MOUTH 2 TIMES DAILY   cephALEXin (KEFLEX) 500 MG capsule Take 1 tablet prior to dental procedure, take second tablet evening after dental procedure   cholecalciferol (VITAMIN D) 1000 units tablet Take 1,000 Units by mouth daily.   clopidogrel (PLAVIX) 75 MG tablet TAKE 1 TABLET BY MOUTH EVERY DAY   Coenzyme Q10 (CO Q 10 PO)  Take 1 tablet by mouth daily.    ezetimibe (ZETIA) 10 MG tablet Take 1 tablet (10 mg total) by mouth daily.   FREESTYLE LITE test strip TEST BLOOD SUGAR TWICE DAILY   gabapentin (NEURONTIN) 300 MG capsule TAKE 1 CAPSULE BY MOUTH EVERY DAY AT BEDTIME   hydrochlorothiazide (HYDRODIURIL) 12.5 MG tablet Take 1 tablet (12.5 mg total) by mouth daily.   Lancets Micro Thin 33G MISC 1 each by Does not apply route 2 (two) times daily.   losartan (COZAAR) 100 MG tablet TAKE 1 TABLET BY MOUTH EVERY DAY   Misc Natural Products (OSTEO BI-FLEX ADV TRIPLE ST PO) Take 2 capsules by mouth daily.   Multiple Vitamin (MULTIVITAMIN) tablet Take 1 tablet by mouth daily.   nitrofurantoin, macrocrystal-monohydrate, (MACROBID) 100 MG capsule Take 1 capsule (100 mg total) by mouth 2 (two) times daily.   Omega-3 Fatty Acids (FISH OIL) 1000 MG CAPS Take 1 capsule by mouth 2 (two) times daily.   potassium chloride (KLOR-CON M) 10 MEQ tablet TAKE 1 TABLET BY MOUTH EVERY DAY   solifenacin (VESICARE) 5 MG tablet Take 5 mg by mouth daily.   Specialty Vitamins Products ([employer]S HAIR/SKIN/NAILS PO) Take 2 each by mouth daily.   TURMERIC PO Take  1 tablet by mouth daily.   Zinc 50 MG CAPS Take 1 capsule by mouth daily.   Current Facility-Administered Medications (Other)  Medication Route   denosumab (PROLIA) injection 60 mg Subcutaneous      REVIEW OF SYSTEMS:    ALLERGIES Allergies  Allergen Reactions   Ciprofloxacin Nausea And Vomiting   Codeine Nausea And Vomiting   Contrast Media [Iodinated Contrast Media] Nausea And Vomiting   Morphine And Related Nausea And Vomiting   Statins Other (See Comments)    lethargic   Toprol Xl [Metoprolol Tartrate]     Symptoms of heart attack, per pt, told not to take it again (?chest pain)   Clindamycin/Lincomycin Itching and Rash   Penicillins Hives, Swelling and Rash    Has patient had a PCN reaction causing immediate rash, facial/tongue/throat swelling, SOB or lightheadedness  with hypotension: no Has patient had a PCN reaction causing severe rash ivolving mucus membranes or skin necrosis: no Has patient had a PCN reaction that required hospitalization no Has patient had a PCN reaction occurring within the last 10 years: no If all of the above answers are "NO", then may proceed with Cephalosporin use.   Prednisone Palpitations    PAST MEDICAL HISTORY Past Medical History:  Diagnosis Date   Abnormality of gait 07/21/2016   Arthritis    right knee, left wrist   Asthma    related to allergies when working; resolved   Breast cancer ([place]) 7/08 Inrasine ductal L breast   Colon polyps    4/09--adenomatous. 04/2010--normal.  Repeat 2014   DDD (degenerative disc disease), lumbar 4/06   Diabetes mellitus    diet controlled   Diabetic retinopathy    DJD (degenerative joint disease)    Dyslipidemia    Endometrial cancer (HCC) hx   Gout    Hepatitis A history(in [place]-1950's)   History of stroke 10/24/2013   Hypertension    Incontinence    Membranous glomerulonephropathy    [name]   Mini stroke DrWillis   Obesity, unspecified    Osteopenia L hip, 4/06   Ramsay Hunt cerebellar syndrome ([place]) 07/21/2016   Skin cancer    [name]   Sliding hiatal hernia small sliding hiatal hernia   Wears glasses    Past Surgical History:  Procedure Laterality Date   ABDOMINAL HYSTERECTOMY     endometrial cancer   APPENDECTOMY     BREAST LUMPECTOMY Left 8/08 L Breast ([name])   CATARACT EXTRACTION     COLONOSCOPY  8/08 [name] (normal)   kidney stones     left middle finger     REPLACEMENT TOTAL KNEE  Left   SKIN CANCER EXCISION     TOTAL KNEE ARTHROPLASTY     left    FAMILY HISTORY Family History  Problem Relation Age of Onset   Heart attack Father    Diabetes Father    Heart attack Mother    Hypertension Mother    Diabetes Mother    Hypertension Brother    Stroke Brother    Melanoma Brother    Diabetes Brother    Hypertension Son    Alcohol  abuse Son    Stroke Paternal Grandmother    Diabetes Paternal Grandfather    Vision loss Paternal Grandfather        related to diabetes   Cancer Neg Hx     SOCIAL HISTORY Social History   Tobacco Use   Smoking status: Never   Smokeless tobacco: Never  Vaping Use  Vaping Use: Never used  Substance Use Topics   Alcohol use: No   Drug use: No         OPHTHALMIC EXAM:  Base Eye Exam     Visual Acuity (ETDRS)       Right Left   Dist [place] 20/40 -2 20/40 -1   Dist cc 20/50 20/40 -2   Dist ph cc NI NI    Correction: Glasses         Tonometry (Tonopen, 1:40 PM)       Right Left   Pressure 13 13         Pupils       Pupils Dark Light APD   Right PERRL 4 4 None   Left PERRL 4 4 None         Visual Fields (Counting fingers)       Left Right    Full Full         Extraocular Movement       Right Left    Full Full         Neuro/Psych     Oriented x3: Yes   Mood/Affect: Normal         Dilation     Both eyes: 1.0% Mydriacyl, 2.5% Phenylephrine @ 1:40 PM           Slit Lamp and Fundus Exam     External Exam       Right Left   External Normal Normal         Slit Lamp Exam       Right Left   Lids/Lashes Normal Normal   Conjunctiva/Sclera White and quiet White and quiet   Cornea Clear Clear   Anterior Chamber Deep and quiet Deep and quiet   Iris Round and reactive Round and reactive   Lens Centered posterior chamber intraocular lens Centered posterior chamber intraocular lens   Anterior Vitreous Normal Normal         Fundus Exam       Right Left   Posterior Vitreous Posterior vitreous detachment Posterior vitreous detachment   Disc Normal Normal   C/D Ratio 0.2 0.25   Macula Normal Normal   Vessels NPDR- Mild NPDR- Mild   Periphery Normal Normal            IMAGING AND PROCEDURES  Imaging and Procedures for 10/22/21  OCT, Retina - OU - Both Eyes       Right Eye Quality was good. Scan locations included  subfoveal. Central Foveal Thickness: 253. Progression has been stable. Findings include normal foveal contour.   Left Eye Quality was good. Scan locations included subfoveal. Central Foveal Thickness: 261. Progression has been stable. Findings include normal foveal contour.   Notes No active maculopathy     Color Fundus Photography Optos - OU - Both Eyes       Right Eye Progression has been stable. Disc findings include normal observations. Macula : normal observations. Vessels : normal observations. Periphery : normal observations.   Left Eye Progression has been stable. Macula : normal observations. Vessels : normal observations. Periphery : normal observations.              ASSESSMENT/PLAN:  Moderate nonproliferative diabetic retinopathy of right eye (HCC) Moderate NPDR, no interval change  Moderate nonproliferative diabetic retinopathy of left eye (HCC) Moderate NPDR  Posterior vitreous detachment of both eyes  The nature of posterior vitreous detachment was discussed with the patient as well as its physiology,  its age prevalence, and its possible implication regarding retinal breaks and detachment.  An informational brochure was offered to the patient.  All the patient's questions were answered.  The patient was asked to return if new or different flashes or floaters develops.   Patient was instructed to contact office immediately if any new changes were noticed. I explained to the patient that vitreous inside the eye is similar to jello inside a bowl. As the jello melts it can start to pull away from the bowl, similarly the vitreous throughout our lives can begin to pull away from the retina. That process is called a posterior vitreous detachment. In some cases, the vitreous can tug hard enough on the retina to form a retinal tear. I discussed with the patient the signs and symptoms of a retinal detachment.  Do not rub the eye.       ICD-10-CM   1. Nonproliferative  diabetic retinopathy of right eye ([place])  E11.3291 OCT, Retina - OU - Both Eyes    Color Fundus Photography Optos - OU - Both Eyes    2. Posterior vitreous detachment of both eyes  H43.813       1.  Moderate NPDR OU, stable.  No interval change.  Annual examination    2.  3.  Ophthalmic Meds Ordered this visit:  No orders of the defined types were placed in this encounter.      Return in about 1 year (around 10/22/2022) for DILATE OU, COLOR FP, OCT.  There are no Patient Instructions on file for this visit.   Explained the diagnoses, plan, and follow up with the patient and they expressed understanding.  Patient expressed understanding of the importance of proper follow up care.   Clent Demark Ilyssa Grennan M.D. Diseases & Surgery of the Retina and Vitreous [facility] [place] 10/22/21     Abbreviations: M myopia (nearsighted); A astigmatism; H hyperopia (farsighted); P presbyopia; Mrx spectacle prescription;  CTL contact lenses; OD right eye; OS left eye; OU both eyes  XT exotropia; ET esotropia; PEK punctate epithelial keratitis; PEE punctate epithelial erosions; DES dry eye syndrome; MGD meibomian gland dysfunction; ATs artificial tears; PFAT's preservative free artificial tears; [place] nuclear sclerotic cataract; PSC posterior subcapsular cataract; ERM epi-retinal membrane; PVD posterior vitreous detachment; RD retinal detachment; DM diabetes mellitus; DR diabetic retinopathy; NPDR non-proliferative diabetic retinopathy; PDR proliferative diabetic retinopathy; CSME clinically significant macular edema; DME diabetic macular edema; dbh dot blot hemorrhages; CWS cotton wool spot; POAG primary open angle glaucoma; C/D cup-to-disc ratio; HVF humphrey visual field; GVF goldmann visual field; OCT optical coherence tomography; IOP intraocular pressure; BRVO Branch retinal vein occlusion; CRVO central retinal vein occlusion; CRAO central retinal artery occlusion; BRAO branch retinal artery  occlusion; RT retinal tear; SB scleral buckle; PPV pars plana vitrectomy; VH Vitreous hemorrhage; PRP panretinal laser photocoagulation; IVK intravitreal kenalog; VMT vitreomacular traction; MH Macular hole;  NVD neovascularization of the disc; NVE neovascularization elsewhere; AREDS age related eye disease study; ARMD age related macular degeneration; POAG primary open angle glaucoma; EBMD epithelial/anterior basement membrane dystrophy; ACIOL anterior chamber intraocular lens; IOL intraocular lens; PCIOL posterior chamber intraocular lens; Phaco/IOL phacoemulsification with intraocular lens placement; [place] photorefractive keratectomy; LASIK laser assisted in situ keratomileusis; HTN hypertension; DM diabetes mellitus; COPD chronic obstructive pulmonary disease

## 2021-10-22 NOTE — Assessment & Plan Note (Signed)
Moderate NPDR

## 2021-10-22 NOTE — Assessment & Plan Note (Signed)

## 2021-10-22 NOTE — Assessment & Plan Note (Signed)
Moderate NPDR, no interval change

## 2021-11-05 ENCOUNTER — Other Ambulatory Visit: Payer: Self-pay | Admitting: Family Medicine

## 2021-11-05 DIAGNOSIS — E7800 Pure hypercholesterolemia, unspecified: Secondary | ICD-10-CM

## 2021-11-18 ENCOUNTER — Other Ambulatory Visit: Payer: Self-pay | Admitting: Family Medicine

## 2021-11-18 DIAGNOSIS — Z8673 Personal history of transient ischemic attack (TIA), and cerebral infarction without residual deficits: Secondary | ICD-10-CM

## 2021-12-14 ENCOUNTER — Other Ambulatory Visit: Payer: Self-pay | Admitting: Family Medicine

## 2021-12-14 DIAGNOSIS — I10 Essential (primary) hypertension: Secondary | ICD-10-CM

## 2021-12-16 ENCOUNTER — Other Ambulatory Visit: Payer: Self-pay | Admitting: Family Medicine

## 2021-12-16 DIAGNOSIS — I10 Essential (primary) hypertension: Secondary | ICD-10-CM

## 2021-12-23 ENCOUNTER — Encounter: Payer: Medicare PPO | Admitting: Family Medicine

## 2021-12-24 NOTE — Progress Notes (Signed)
Chief Complaint  ?Patient presents with  ? fasting med check  ?  Fasting med check- right leg pain and swelling. Burning with urination. Needs a form filled out but forgot to bring it to the appointment today. Would like plavix switched to something else because she bruises easily, having some hearing issues and needs new aids, no refills needed today. BS was 99 and 98 when she recently checked it, BP has been 124/67, 142/79. I have abstracted Alb.cr and DM eye exam  ? ? ?Patient presents for 6 month follow-up on chronic problems. ? ?She is complaining of swelling on her RLE.  She has itchy areas to her skin.  There are some scabs, but she can't reach them to put medication on it. ?Swelling and itching comes and goes.  Swelling improves with elevation. ?Scabs have been present x 6 weeks, doesn't recall an injury. ?Denies fever ? ?She is asking to switch Plavix due to bruising, asking about Eliquis. ?She has bruising on her hands, forearms.  Had a lot of bruising on her left chest wall when she fell. She denies any bleeding.  ? ?Has hearing aids. Going to look for a different kind. ? ?Osteoporosis:  DEXA was last done in 11/2019, T-1.4 L radius. This is improved since starting Prolia in 2019. She is tolerating Prolia without side effects. Last injection was 06/2021. ?She continues to take Calcium with D, and drinks 1 glass of milk and has cheese. She is also taking Vitamin D3 1000 IU daily. She continues to take MVI  (contains 400 IU). She had a normal Vitamin D level in 11/2017 (45.4).  ?She isn't getting any weight-bearing exercise, due to pain. ?  ?She has known severe R hip arthritis, last x-rays were 03/2018.  She was told she couldn't have surgery (due to risks of anesthesia).   She has been seeing Dr. Erlinda Hong for pain in the hip and shoulder, as well as Dr. Ernestina Patches. She underwent injection of R hip and R shoulder in 10/2021. The first injection didn't help, but the 2nd injection has helped.  She still has R groin pain,  but the rest of the discomfort she was having has improved. ?  ?Hypertension:  She reports compliance with taking carvedilol, HCTZ (and potassium), and losartan.  Patient didn't bring her med list with her.  Amlodipine is still on her list--doesn't recall if she is still taking.  We have noted it was stopped by Dr. Hollie Salk. ? ?Denies any headaches, lightheadedness, dizziness, chest pain, palpitations. She hasn't been getting many muscle cramps lately ? ?BPs are running 124/67, 142/79 over the last week.  Had some 154/61, 91/41--recheck with wrist monitor was was better. ?These monitors haven't been checked recently. ? ?BP Readings from Last 3 Encounters:  ?12/25/21 140/80  ?06/20/21 (!) 154/90  ?11/21/20 140/80  ? ?  ?Diabetes, in remission:  Controlled by diet, she has never taken medications. Last A1c was 5.3 in 06/2021. ?Sugars are running <100, checked infrequently (once a week), 98 and 99 for the last 2 readings.Marland Kitchen ?Diabetic eye exams are UTD (saw Dr. Zadie Rhine last in 10/2021, has nonproliferative diabetic retinopathy, moderate, bilateral.) ?Denies polydipsia, polyuria (continues to void 2-3x/night).   ?She has numbness in her left foot/ankle/lower leg since her minor stroke, unchanged. She denies any burning or sores on her feet.  See above for concern of RLE ? ?OAB--She sees Dr. Wendy Poet once a year, he prescribes Vesicare.  Due again in August. Continues to have bladder prolapse (pessaries didn't  work well, would fall out).  ?She continues to have burning which starts just prior to voiding, and burns while voiding. Denies any odor to the urine, no hematuria, no abdominal or flank pain. ?She has chronic burning with urination, thinks she has a UTI all the time. It burns when she voids.  She states the nephrologist treated her with ABX for UTI at her visit 2 months ago, can't recall the name. She reports it felt better, but only for about a month. ?Slight incontinence, wears a pad.  Wakes up 2-3x/night to  void. ?Last note we have from nephro was 07/2021, no urinalysis (just for protein), no mention of UTI or ABX or dysuria.  Perhaps she had a subsequent visit, notes not received.  ?  ?Hyperlipidemia-- She is taking a fish oil capsule that she found that does not have red dye, 1025m, taking 2 daily. Trying to follow a low fat diet. She limits her carbs, sweets, fried foods. Snacks on cheese. Compliant with Zetia. Lipids were at goal on last check, though HDL was low.  ?Lab Results  ?Component Value Date  ? CHOL 147 06/20/2021  ? HDL 38 (L) 06/20/2021  ? LLa [place]85 06/20/2021  ? TRIG 133 06/20/2021  ? CHOLHDL 3.9 06/20/2021  ? ?   ?Memory concerns: She continues to take Prevagen, and she finds it effective.  She notices improvement--easier to recall names.  She writes things down, keeps lists, and denies any significant problems or changes. ?  ?H/o CVA, on Plavix. She denies any bleeding. +bruising on hands/forearms. She has residual numbness of L foot. Also felt to have a mild peripheral neuropathy, possibly related to her diabetes (diet controlled).  She remains on gabapentin (started by Dr. MJana Hakimin the past).  She denies any pain in her foot, just a mild numbness in the L foot/lower leg unchanged. ?  ?History of Membranous nephropathy.  Dr. DLorrene Reidretired and she now is seeing Dr. UHollie Salk  Last OV with nephro was 07/2021, still felt to be in remission. No med changes. ?No mention of urinary complaints or UTI in last note received. ? ? ? ?PMH, PSH, SH reviewed ? ?Outpatient Encounter Medications as of 12/25/2021  ?Medication Sig Note  ? Acetaminophen (TYLENOL ARTHRITIS PAIN PO) Take 2 tablets by mouth 2 (two) times daily. 12/25/2021: Taking 2 pills TID  ? Apoaequorin (PREVAGEN PO) Take 1 tablet by mouth daily.   ? Ascorbic Acid (VITAMIN C) 500 MG CAPS Take 1 tablet by mouth daily.   ? Calcium Carbonate-Vitamin D (CALTRATE 600+D PO) Take 2 tablets by mouth daily.    ? carvedilol (COREG) 25 MG tablet TAKE 1.5 TABLETS  BY MOUTH 2 TIMES DAILY   ? cholecalciferol (VITAMIN D) 1000 units tablet Take 1,000 Units by mouth daily.   ? clopidogrel (PLAVIX) 75 MG tablet TAKE 1 TABLET BY MOUTH EVERY DAY   ? Coenzyme Q10 (CO Q 10 PO) Take 1 tablet by mouth daily.    ? ezetimibe (ZETIA) 10 MG tablet TAKE 1 TABLET BY MOUTH EVERY DAY   ? gabapentin (NEURONTIN) 300 MG capsule TAKE 1 CAPSULE BY MOUTH EVERY DAY AT BEDTIME   ? hydrochlorothiazide (HYDRODIURIL) 12.5 MG tablet TAKE 1 TABLET(12.5 MG) BY MOUTH DAILY   ? KLOR-CON M10 10 MEQ tablet TAKE 1 TABLET BY MOUTH EVERY DAY   ? losartan (COZAAR) 100 MG tablet TAKE 1 TABLET BY MOUTH EVERY DAY   ? Misc Natural Products (OSTEO BI-FLEX ADV TRIPLE ST PO) Take 2 capsules  by mouth daily.   ? Multiple Vitamin (MULTIVITAMIN) tablet Take 1 tablet by mouth daily.   ? Omega-3 Fatty Acids (FISH OIL) 1000 MG CAPS Take 1 capsule by mouth 2 (two) times daily. 12/07/2017: Reservatrol 2 daily-omega plus  ? solifenacin (VESICARE) 5 MG tablet Take 5 mg by mouth daily.   ? Specialty Vitamins Products ([employer]S HAIR/SKIN/NAILS PO) Take 2 each by mouth daily.   ? TURMERIC PO Take 1 tablet by mouth daily.   ? [DISCONTINUED] amLODipine (NORVASC) 5 MG tablet TAKE 1 TABLET BY MOUTH EVERY DAY 06/20/2021: This was stopped by Dr. Hollie Salk  ? Blood Glucose Monitoring Suppl (ACCU-CHEK GUIDE) w/Device KIT 1 each by Does not apply route 2 (two) times daily. (Patient not taking: Reported on 12/25/2021)   ? cephALEXin (KEFLEX) 500 MG capsule Take 1 tablet prior to dental procedure, take second tablet evening after dental procedure (Patient not taking: Reported on 12/25/2021)   ? FREESTYLE LITE test strip TEST BLOOD SUGAR TWICE DAILY (Patient not taking: Reported on 12/25/2021)   ? Lancets Micro Thin 33G MISC 1 each by Does not apply route 2 (two) times daily. (Patient not taking: Reported on 12/25/2021)   ? Zinc 50 MG CAPS Take 1 capsule by mouth daily. (Patient not taking: Reported on 12/25/2021)   ? [DISCONTINUED] nitrofurantoin,  macrocrystal-monohydrate, (MACROBID) 100 MG capsule Take 1 capsule (100 mg total) by mouth 2 (two) times daily.   ? [DISCONTINUED] potassium chloride (K-DUR) 10 MEQ tablet TAKE 1 TABLET BY MOUTH EVERY DAY   ? ?Facility-Admin

## 2021-12-25 ENCOUNTER — Ambulatory Visit: Payer: Medicare PPO | Admitting: Family Medicine

## 2021-12-25 ENCOUNTER — Encounter: Payer: Self-pay | Admitting: Family Medicine

## 2021-12-25 VITALS — BP 130/70 | HR 64 | Temp 97.8°F | Wt 191.4 lb

## 2021-12-25 DIAGNOSIS — E113299 Type 2 diabetes mellitus with mild nonproliferative diabetic retinopathy without macular edema, unspecified eye: Secondary | ICD-10-CM | POA: Diagnosis not present

## 2021-12-25 DIAGNOSIS — M810 Age-related osteoporosis without current pathological fracture: Secondary | ICD-10-CM

## 2021-12-25 DIAGNOSIS — E119 Type 2 diabetes mellitus without complications: Secondary | ICD-10-CM | POA: Diagnosis not present

## 2021-12-25 DIAGNOSIS — Z5181 Encounter for therapeutic drug level monitoring: Secondary | ICD-10-CM

## 2021-12-25 DIAGNOSIS — I10 Essential (primary) hypertension: Secondary | ICD-10-CM

## 2021-12-25 DIAGNOSIS — I872 Venous insufficiency (chronic) (peripheral): Secondary | ICD-10-CM

## 2021-12-25 DIAGNOSIS — E782 Mixed hyperlipidemia: Secondary | ICD-10-CM

## 2021-12-25 DIAGNOSIS — Z87448 Personal history of other diseases of urinary system: Secondary | ICD-10-CM

## 2021-12-25 DIAGNOSIS — R300 Dysuria: Secondary | ICD-10-CM | POA: Diagnosis not present

## 2021-12-25 DIAGNOSIS — Z8673 Personal history of transient ischemic attack (TIA), and cerebral infarction without residual deficits: Secondary | ICD-10-CM

## 2021-12-25 DIAGNOSIS — D692 Other nonthrombocytopenic purpura: Secondary | ICD-10-CM | POA: Diagnosis not present

## 2021-12-25 DIAGNOSIS — D649 Anemia, unspecified: Secondary | ICD-10-CM

## 2021-12-25 DIAGNOSIS — Z7901 Long term (current) use of anticoagulants: Secondary | ICD-10-CM | POA: Diagnosis not present

## 2021-12-25 DIAGNOSIS — E113293 Type 2 diabetes mellitus with mild nonproliferative diabetic retinopathy without macular edema, bilateral: Secondary | ICD-10-CM

## 2021-12-25 LAB — POCT URINALYSIS DIP (PROADVANTAGE DEVICE)
Bilirubin, UA: NEGATIVE
Glucose, UA: NEGATIVE mg/dL
Ketones, POC UA: NEGATIVE mg/dL
Nitrite, UA: POSITIVE — AB
Protein Ur, POC: NEGATIVE mg/dL
Specific Gravity, Urine: 1.02
Urobilinogen, Ur: NEGATIVE
pH, UA: 5 (ref 5.0–8.0)

## 2021-12-25 LAB — POCT GLYCOSYLATED HEMOGLOBIN (HGB A1C): Hemoglobin A1C: 5.1 % (ref 4.0–5.6)

## 2021-12-25 MED ORDER — TRIAMCINOLONE ACETONIDE 0.1 % EX CREA: 1.0000 "application " | TOPICAL_CREAM | Freq: Two times a day (BID) | CUTANEOUS | 0 refills | Status: DC

## 2021-12-25 NOTE — Patient Instructions (Addendum)
We discussed the risks vs benefits of being on medication to prevent strokes, which can cause bruising.  Since the bruising is fairly minor and limited to upper extremities, I would continue the Plavix. ? ?Use the sheet provided to record your blood pressure readings (and pulse). ?Put comments as to your pain level, if had saltier food (try and avoid processed foods!), and which monitor was used. ?Bring in the list of blood pressures when you come to have your monitors checked. ?Please be sure to keep the wrist monitor at heart height (rest it on your chest) when checking your blood pressure. The arm monitor should also be at heart height (rest on kitchen table). ? ?We are sending your urine for culture to assess for a recurrent urinary tract infection. ?Since you were also treated for one 2 months ago, if you continue to have ongoing/frequent urinary tract infections, you should discuss this with Dr. Wendy Poet. (You may need to have additional evaluation, including ultrasound to see how well your bladder empties, and to discuss whether preventative antibiotics are indicated). ? ?Please try and keep your legs elevated to drain the swelling/fluid before the skin breaks open. This will increase the risk for a skin infection. I don't think there is an infection now. ?Use an antibacterial ointment to the scabbed area. ?Use triamcinolone cream (prescription steroid cream) to the thickened, scaly areas and where it itches.  Use this sparingly, only twice a day for no more than 2 weeks (use it only until the itching has resolved). ? ?Excess sodium in the diet can contribute to higher blood pressure and fluid retention.  You mentioned eating more bacon and ham recently. Please limit process foods. ?Ideally, compressing the leg will help this heal and resolve.  Compression socks, vs using an ACE wrap.  Keeping the leg elevated when you can, will also help. ?Return in 1-2 weeks if it isn't improving, or any worsening (fever,  pain, drainage). ?

## 2021-12-26 LAB — COMPREHENSIVE METABOLIC PANEL
ALT: 7 IU/L (ref 0–32)
AST: 16 IU/L (ref 0–40)
Albumin/Globulin Ratio: 1.6 (ref 1.2–2.2)
Albumin: 3.6 g/dL (ref 3.5–4.6)
Alkaline Phosphatase: 46 IU/L (ref 44–121)
BUN/Creatinine Ratio: 39 — ABNORMAL HIGH (ref 12–28)
BUN: 31 mg/dL (ref 10–36)
Bilirubin Total: 0.3 mg/dL (ref 0.0–1.2)
CO2: 27 mmol/L (ref 20–29)
Calcium: 9.9 mg/dL (ref 8.7–10.3)
Chloride: 105 mmol/L (ref 96–106)
Creatinine, Ser: 0.8 mg/dL (ref 0.57–1.00)
Globulin, Total: 2.3 g/dL (ref 1.5–4.5)
Glucose: 103 mg/dL — ABNORMAL HIGH (ref 70–99)
Potassium: 4.2 mmol/L (ref 3.5–5.2)
Sodium: 144 mmol/L (ref 134–144)
Total Protein: 5.9 g/dL — ABNORMAL LOW (ref 6.0–8.5)
eGFR: 70 mL/min/{1.73_m2} (ref >59)

## 2021-12-26 LAB — CBC WITH DIFFERENTIAL/PLATELET
Basophils Absolute: 0.1 10*3/uL (ref 0.0–0.2)
Basos: 1 %
EOS (ABSOLUTE): 0.3 10*3/uL (ref 0.0–0.4)
Eos: 5 %
Hematocrit: 30.4 % — ABNORMAL LOW (ref 34.0–46.6)
Hemoglobin: 9.9 g/dL — ABNORMAL LOW (ref 11.1–15.9)
Immature Grans (Abs): 0 10*3/uL (ref 0.0–0.1)
Immature Granulocytes: 0 %
Lymphocytes Absolute: 2.6 10*3/uL (ref 0.7–3.1)
Lymphs: 40 %
MCH: 31.5 pg (ref 26.6–33.0)
MCHC: 32.6 g/dL (ref 31.5–35.7)
MCV: 97 fL (ref 79–97)
Monocytes Absolute: 0.6 10*3/uL (ref 0.1–0.9)
Monocytes: 9 %
Neutrophils Absolute: 2.9 10*3/uL (ref 1.4–7.0)
Neutrophils: 45 %
Platelets: 180 10*3/uL (ref 150–450)
RBC: 3.14 x10E6/uL — ABNORMAL LOW (ref 3.77–5.28)
RDW: 12.1 % (ref 11.7–15.4)
WBC: 6.5 10*3/uL (ref 3.4–10.8)

## 2021-12-26 NOTE — Progress Notes (Signed)
Please have Kristin Willis add on iron level, ferritin, B12.  Dx new onset anemia (and other dx fr visit including med monitor, chronic anticoag use) ?You can advise pt that her sugar was 103, kidney function, electrolytes and liver tests are normal.  She has a new onset anemia.  I don't recall any history of bleeding, other than the bruising she mentioned related to a fall. Verify that she hasn't had any bleeding (hemorrhoid, nosebleeds, in urine, black/bloody stools, any abdominal pain, etc). ?Let her know that we are adding on iron studies, and we will be back in touch Monday with those results and a plan.

## 2021-12-27 ENCOUNTER — Other Ambulatory Visit: Payer: Self-pay | Admitting: Family Medicine

## 2021-12-27 LAB — FERRITIN: Ferritin: 21 ng/mL (ref 15–150)

## 2021-12-27 LAB — SPECIMEN STATUS REPORT

## 2021-12-27 LAB — IRON: Iron: 36 ug/dL (ref 27–139)

## 2021-12-27 LAB — VITAMIN B12: Vitamin B-12: 597 pg/mL (ref 232–1245)

## 2021-12-30 ENCOUNTER — Other Ambulatory Visit: Payer: Self-pay | Admitting: *Deleted

## 2021-12-30 LAB — URINE CULTURE

## 2021-12-30 MED ORDER — NITROFURANTOIN MONOHYD MACRO 100 MG PO CAPS: 100.0000 mg | ORAL_CAPSULE | Freq: Two times a day (BID) | ORAL | 0 refills | Status: DC

## 2021-12-30 NOTE — Progress Notes (Signed)
Patient will call me back when she gets home so she can check to see if she is taking amlodipine.  ?

## 2022-01-06 ENCOUNTER — Telehealth: Payer: Self-pay | Admitting: *Deleted

## 2022-01-06 ENCOUNTER — Other Ambulatory Visit: Payer: Medicare PPO

## 2022-01-06 NOTE — Telephone Encounter (Signed)
Patient came in for bp monitor verification. I got 162/82. Her arm machine read 163/84, her wrist machine read 160/82 and she brought a new machine that we could not get to even work. She also brought paperwork for mailbox at her back door-I will put in your folder and I told her I will mail her that along with the Physician's statement when finished. ?

## 2022-01-07 NOTE — Telephone Encounter (Signed)
Patient advised. Letter mailed. ?

## 2022-01-07 NOTE — Telephone Encounter (Signed)
Letter written.  Please sign/stamp and return with her form. ? ?BP monitor is accurate, BP is too high.  I'll review her list that she will be dropping off when she returns for labs. ?

## 2022-01-09 ENCOUNTER — Other Ambulatory Visit: Payer: Self-pay

## 2022-01-09 DIAGNOSIS — I872 Venous insufficiency (chronic) (peripheral): Secondary | ICD-10-CM

## 2022-01-09 MED ORDER — TRIAMCINOLONE ACETONIDE 0.1 % EX CREA: 1.0000 "application " | TOPICAL_CREAM | Freq: Two times a day (BID) | CUTANEOUS | 0 refills | Status: DC

## 2022-01-14 ENCOUNTER — Other Ambulatory Visit: Payer: Self-pay | Admitting: Family Medicine

## 2022-01-14 DIAGNOSIS — I872 Venous insufficiency (chronic) (peripheral): Secondary | ICD-10-CM

## 2022-01-18 ENCOUNTER — Other Ambulatory Visit: Payer: Self-pay | Admitting: Family Medicine

## 2022-01-18 DIAGNOSIS — I10 Essential (primary) hypertension: Secondary | ICD-10-CM

## 2022-01-21 ENCOUNTER — Telehealth: Payer: Self-pay | Admitting: Internal Medicine

## 2022-01-21 ENCOUNTER — Other Ambulatory Visit (INDEPENDENT_AMBULATORY_CARE_PROVIDER_SITE_OTHER): Payer: Medicare PPO

## 2022-01-21 ENCOUNTER — Other Ambulatory Visit: Payer: Self-pay | Admitting: Family Medicine

## 2022-01-21 VITALS — BP 134/74

## 2022-01-21 DIAGNOSIS — D649 Anemia, unspecified: Secondary | ICD-10-CM

## 2022-01-21 DIAGNOSIS — M810 Age-related osteoporosis without current pathological fracture: Secondary | ICD-10-CM | POA: Diagnosis not present

## 2022-01-21 MED ORDER — DENOSUMAB 60 MG/ML ~~LOC~~ SOSY
60.0000 mg | PREFILLED_SYRINGE | Freq: Once | SUBCUTANEOUS | Status: AC
  Administered 2022-01-21: 60 mg via SUBCUTANEOUS

## 2022-01-21 NOTE — Telephone Encounter (Signed)
I have released order ?

## 2022-01-21 NOTE — Telephone Encounter (Signed)
Also patient has brought in BP readings I will place in your folder to review ?

## 2022-01-21 NOTE — Telephone Encounter (Signed)
I put in a future order so you can release it.  ?

## 2022-01-21 NOTE — Telephone Encounter (Signed)
Pt is here for bp check and cbc but no orders. Please advise dx for cbc  ?

## 2022-01-22 ENCOUNTER — Other Ambulatory Visit: Payer: Self-pay | Admitting: *Deleted

## 2022-01-22 DIAGNOSIS — D649 Anemia, unspecified: Secondary | ICD-10-CM

## 2022-01-22 LAB — CBC WITH DIFFERENTIAL/PLATELET
Basophils Absolute: 0.1 10*3/uL (ref 0.0–0.2)
Basos: 1 %
EOS (ABSOLUTE): 0.3 10*3/uL (ref 0.0–0.4)
Eos: 5 %
Hematocrit: 30.9 % — ABNORMAL LOW (ref 34.0–46.6)
Hemoglobin: 10 g/dL — ABNORMAL LOW (ref 11.1–15.9)
Immature Grans (Abs): 0 10*3/uL (ref 0.0–0.1)
Immature Granulocytes: 0 %
Lymphocytes Absolute: 2.5 10*3/uL (ref 0.7–3.1)
Lymphs: 41 %
MCH: 30.3 pg (ref 26.6–33.0)
MCHC: 32.4 g/dL (ref 31.5–35.7)
MCV: 94 fL (ref 79–97)
Monocytes Absolute: 0.5 10*3/uL (ref 0.1–0.9)
Monocytes: 9 %
Neutrophils Absolute: 2.7 10*3/uL (ref 1.4–7.0)
Neutrophils: 44 %
Platelets: 215 10*3/uL (ref 150–450)
RBC: 3.3 x10E6/uL — ABNORMAL LOW (ref 3.77–5.28)
RDW: 12.4 % (ref 11.7–15.4)
WBC: 6 10*3/uL (ref 3.4–10.8)

## 2022-01-28 DIAGNOSIS — E785 Hyperlipidemia, unspecified: Secondary | ICD-10-CM | POA: Diagnosis not present

## 2022-01-28 DIAGNOSIS — N3281 Overactive bladder: Secondary | ICD-10-CM | POA: Diagnosis not present

## 2022-01-28 DIAGNOSIS — N390 Urinary tract infection, site not specified: Secondary | ICD-10-CM | POA: Diagnosis not present

## 2022-01-28 DIAGNOSIS — N022 Recurrent and persistent hematuria with diffuse membranous glomerulonephritis: Secondary | ICD-10-CM | POA: Diagnosis not present

## 2022-01-28 DIAGNOSIS — I10 Essential (primary) hypertension: Secondary | ICD-10-CM | POA: Diagnosis not present

## 2022-01-28 DIAGNOSIS — M810 Age-related osteoporosis without current pathological fracture: Secondary | ICD-10-CM | POA: Diagnosis not present

## 2022-01-30 DIAGNOSIS — N022 Recurrent and persistent hematuria with diffuse membranous glomerulonephritis: Secondary | ICD-10-CM | POA: Diagnosis not present

## 2022-01-30 DIAGNOSIS — N390 Urinary tract infection, site not specified: Secondary | ICD-10-CM | POA: Diagnosis not present

## 2022-01-31 LAB — MICROALBUMIN / CREATININE URINE RATIO: Microalb Creat Ratio: 74

## 2022-02-05 ENCOUNTER — Encounter: Payer: Self-pay | Admitting: *Deleted

## 2022-02-06 ENCOUNTER — Encounter: Payer: Self-pay | Admitting: Family Medicine

## 2022-02-06 DIAGNOSIS — I872 Venous insufficiency (chronic) (peripheral): Secondary | ICD-10-CM | POA: Diagnosis not present

## 2022-02-06 DIAGNOSIS — H9203 Otalgia, bilateral: Secondary | ICD-10-CM | POA: Diagnosis not present

## 2022-02-11 ENCOUNTER — Telehealth: Payer: Self-pay

## 2022-02-11 ENCOUNTER — Other Ambulatory Visit: Payer: Self-pay

## 2022-02-11 ENCOUNTER — Encounter (HOSPITAL_COMMUNITY): Payer: Self-pay

## 2022-02-11 ENCOUNTER — Encounter: Payer: Self-pay | Admitting: Family Medicine

## 2022-02-11 ENCOUNTER — Ambulatory Visit: Payer: Medicare PPO | Admitting: Family Medicine

## 2022-02-11 ENCOUNTER — Ambulatory Visit (HOSPITAL_COMMUNITY)
Admission: RE | Admit: 2022-02-11 | Discharge: 2022-02-11 | Disposition: A | Discharge status: Home / Self Care | Payer: Medicare PPO | Source: Ambulatory Visit | Attending: Cardiology | Admitting: Cardiology

## 2022-02-11 VITALS — BP 126/70 | HR 73 | Temp 96.8°F

## 2022-02-11 DIAGNOSIS — I82501 Chronic embolism and thrombosis of unspecified deep veins of right lower extremity: Secondary | ICD-10-CM

## 2022-02-11 DIAGNOSIS — L989 Disorder of the skin and subcutaneous tissue, unspecified: Secondary | ICD-10-CM | POA: Diagnosis not present

## 2022-02-11 DIAGNOSIS — M7989 Other specified soft tissue disorders: Secondary | ICD-10-CM

## 2022-02-11 NOTE — Telephone Encounter (Signed)
Pt. Called stating went to [facility] in [place], saw her Friday rt. Leg swollen and red. It has an open wound on the side and bump on the back. Pt. Needs an order for an [name] of that leg to check for a blood clot. She stats the [facility] on [address] Has an apt. At 8 which should could go to because her daughter can take her. If you could put the order in for that before 11. Pt. Stats she talked to you about her leg before but at that time it was not hurting her like it is now.

## 2022-02-11 NOTE — Patient Instructions (Addendum)
No aspirin or clopidogrel.  You can take Tylenol for aches and pains.  Keep your leg elevated as much as possible clean the area with that that little ulcer put a Band-Aid on it and use an antibiotic ointment during the

## 2022-02-11 NOTE — Telephone Encounter (Signed)
Kristin Willis called from vascular to let us know that the report did show that the pt. Had a blood clot in the common femoral vein. I told her I would send you a urgent message. She stated she was going to send the pt. Home and let her know that we would call her later and let her know what she needed to do know if she needed to go to the hospital or not.

## 2022-02-11 NOTE — Progress Notes (Signed)
   Subjective:    Patient ID: Kristin Willis, female    DOB: Oct 16, 1928, 88 y.o.   MRN: [id number]  HPI She is here with her daughter.  She states that she has had intermittent difficulty with swelling in her right leg over the last several months.  The pain got severe enough that she went to an urgent care center and they recommended follow-up.  Ultrasound was ordered by Dr. Tomi Bamberger which did show evidence of chronic DVT.  Presently she is on Plavix.  She does not complain of chest pain, shortness of breath. At the end of the encounter she mentioned noticing a questionable ulcerated lesion in the right lower calf area.  Review of Systems     Objective:   Physical Exam Alert and in no distress.  Cardiac exam shows regular rhythm without murmurs gallops.  Lungs are clear to auscultation.  Questionable positive Homans on the right.  The report along the DVT was evaluated by me.  There is also a possible ulcerated lesion on the right lower calf area.  No drainage is noted.       Assessment & Plan:  Chronic deep vein thrombosis (DVT) of right lower extremity, unspecified vein (HCC)  Skin lesion A sample of Xarelto was given with instructions on how to use it.  I did not write a prescription as yet because I did not want to confuse her or her daughter.  I will also schedule her to come back in 1 week for reevaluation of the questionable lesion on her leg to see if indeed is an ulcer.  She is to stop the Plavix and only use Tylenol for pain relief.  She is to keep the lesion on her leg clean and dry and apply an antibiotic ointment and Band-Aid to the area.

## 2022-02-11 NOTE — Telephone Encounter (Signed)
I called pt. Daughter Kristin Willis back per Dr. Tomi Bamberger her mom needs to be seen today either here by [facility] or go back to the urgent care she was seen at before or go to ER. I was unable to reach her daughter and was unable to LVM because it was full.

## 2022-02-11 NOTE — Progress Notes (Unsigned)
Right lower venous has been completed and is POSITIVE for DVT; External iliac vein distal; common femoral vein and SFJ. Preliminary results can be found under CV proc through chart review.  Findings reported to Quita Skye from [facility] at 1:30 pm. Patient instructed to go home and wait for further instructions.   Wilkie Aye RVT [facility]

## 2022-02-14 ENCOUNTER — Telehealth: Payer: Self-pay

## 2022-02-14 NOTE — Telephone Encounter (Signed)
Pt. Called wanting to know if you could referral her to a cardiovascular office with [facility] to help monitor her blood clots.

## 2022-02-18 ENCOUNTER — Other Ambulatory Visit: Payer: Self-pay | Admitting: *Deleted

## 2022-02-18 DIAGNOSIS — I82501 Chronic embolism and thrombosis of unspecified deep veins of right lower extremity: Secondary | ICD-10-CM

## 2022-02-18 NOTE — Telephone Encounter (Signed)
Spoke with patient and she wants to go to [facility] with Dr. Redmond School. Referral done. Patient notified.

## 2022-02-19 ENCOUNTER — Telehealth: Payer: Self-pay

## 2022-02-19 ENCOUNTER — Encounter (HOSPITAL_COMMUNITY): Payer: Self-pay | Admitting: Emergency Medicine

## 2022-02-19 ENCOUNTER — Other Ambulatory Visit: Payer: Self-pay

## 2022-02-19 ENCOUNTER — Emergency Department (HOSPITAL_COMMUNITY): Payer: Medicare PPO

## 2022-02-19 ENCOUNTER — Inpatient Hospital Stay (HOSPITAL_COMMUNITY)
Admission: EM | Admit: 2022-02-19 | Discharge: 2022-02-21 | DRG: 543 | Disposition: A | Discharge status: Skilled Nursing Facility | Payer: Medicare PPO | Attending: Internal Medicine | Admitting: Internal Medicine

## 2022-02-19 DIAGNOSIS — W19XXXA Unspecified fall, initial encounter: Secondary | ICD-10-CM

## 2022-02-19 DIAGNOSIS — Z885 Allergy status to narcotic agent status: Secondary | ICD-10-CM | POA: Diagnosis not present

## 2022-02-19 DIAGNOSIS — E860 Dehydration: Secondary | ICD-10-CM | POA: Diagnosis present

## 2022-02-19 DIAGNOSIS — Z8542 Personal history of malignant neoplasm of other parts of uterus: Secondary | ICD-10-CM | POA: Diagnosis not present

## 2022-02-19 DIAGNOSIS — S0990XA Unspecified injury of head, initial encounter: Secondary | ICD-10-CM | POA: Diagnosis not present

## 2022-02-19 DIAGNOSIS — M47812 Spondylosis without myelopathy or radiculopathy, cervical region: Secondary | ICD-10-CM | POA: Diagnosis not present

## 2022-02-19 DIAGNOSIS — M25551 Pain in right hip: Secondary | ICD-10-CM | POA: Diagnosis not present

## 2022-02-19 DIAGNOSIS — Z881 Allergy status to other antibiotic agents status: Secondary | ICD-10-CM

## 2022-02-19 DIAGNOSIS — M8008XA Age-related osteoporosis with current pathological fracture, vertebra(e), initial encounter for fracture: Secondary | ICD-10-CM | POA: Diagnosis present

## 2022-02-19 DIAGNOSIS — E7800 Pure hypercholesterolemia, unspecified: Secondary | ICD-10-CM | POA: Diagnosis present

## 2022-02-19 DIAGNOSIS — M25512 Pain in left shoulder: Secondary | ICD-10-CM | POA: Diagnosis not present

## 2022-02-19 DIAGNOSIS — S7001XA Contusion of right hip, initial encounter: Secondary | ICD-10-CM | POA: Diagnosis present

## 2022-02-19 DIAGNOSIS — Z853 Personal history of malignant neoplasm of breast: Secondary | ICD-10-CM

## 2022-02-19 DIAGNOSIS — G9389 Other specified disorders of brain: Secondary | ICD-10-CM | POA: Diagnosis not present

## 2022-02-19 DIAGNOSIS — Z823 Family history of stroke: Secondary | ICD-10-CM

## 2022-02-19 DIAGNOSIS — M48061 Spinal stenosis, lumbar region without neurogenic claudication: Secondary | ICD-10-CM | POA: Diagnosis present

## 2022-02-19 DIAGNOSIS — Y92009 Unspecified place in unspecified non-institutional (private) residence as the place of occurrence of the external cause: Secondary | ICD-10-CM | POA: Diagnosis not present

## 2022-02-19 DIAGNOSIS — Z8249 Family history of ischemic heart disease and other diseases of the circulatory system: Secondary | ICD-10-CM | POA: Diagnosis not present

## 2022-02-19 DIAGNOSIS — E1151 Type 2 diabetes mellitus with diabetic peripheral angiopathy without gangrene: Secondary | ICD-10-CM

## 2022-02-19 DIAGNOSIS — Z743 Need for continuous supervision: Secondary | ICD-10-CM | POA: Diagnosis not present

## 2022-02-19 DIAGNOSIS — J984 Other disorders of lung: Secondary | ICD-10-CM | POA: Diagnosis not present

## 2022-02-19 DIAGNOSIS — M1611 Unilateral primary osteoarthritis, right hip: Secondary | ICD-10-CM | POA: Diagnosis present

## 2022-02-19 DIAGNOSIS — R609 Edema, unspecified: Secondary | ICD-10-CM | POA: Diagnosis not present

## 2022-02-19 DIAGNOSIS — S2232XA Fracture of one rib, left side, initial encounter for closed fracture: Secondary | ICD-10-CM | POA: Diagnosis present

## 2022-02-19 DIAGNOSIS — E11319 Type 2 diabetes mellitus with unspecified diabetic retinopathy without macular edema: Secondary | ICD-10-CM | POA: Diagnosis present

## 2022-02-19 DIAGNOSIS — I82511 Chronic embolism and thrombosis of right femoral vein: Secondary | ICD-10-CM | POA: Diagnosis present

## 2022-02-19 DIAGNOSIS — Z96652 Presence of left artificial knee joint: Secondary | ICD-10-CM | POA: Diagnosis present

## 2022-02-19 DIAGNOSIS — S32028A Other fracture of second lumbar vertebra, initial encounter for closed fracture: Secondary | ICD-10-CM | POA: Diagnosis not present

## 2022-02-19 DIAGNOSIS — D649 Anemia, unspecified: Secondary | ICD-10-CM | POA: Diagnosis present

## 2022-02-19 DIAGNOSIS — Z880 Allergy status to penicillin: Secondary | ICD-10-CM | POA: Diagnosis not present

## 2022-02-19 DIAGNOSIS — M5136 Other intervertebral disc degeneration, lumbar region: Secondary | ICD-10-CM | POA: Diagnosis present

## 2022-02-19 DIAGNOSIS — N200 Calculus of kidney: Secondary | ICD-10-CM | POA: Diagnosis not present

## 2022-02-19 DIAGNOSIS — R262 Difficulty in walking, not elsewhere classified: Secondary | ICD-10-CM

## 2022-02-19 DIAGNOSIS — I10 Essential (primary) hypertension: Secondary | ICD-10-CM | POA: Diagnosis present

## 2022-02-19 DIAGNOSIS — Z8673 Personal history of transient ischemic attack (TIA), and cerebral infarction without residual deficits: Secondary | ICD-10-CM | POA: Diagnosis not present

## 2022-02-19 DIAGNOSIS — I824Y1 Acute embolism and thrombosis of unspecified deep veins of right proximal lower extremity: Secondary | ICD-10-CM | POA: Diagnosis not present

## 2022-02-19 DIAGNOSIS — M25511 Pain in right shoulder: Secondary | ICD-10-CM | POA: Diagnosis not present

## 2022-02-19 DIAGNOSIS — K5730 Diverticulosis of large intestine without perforation or abscess without bleeding: Secondary | ICD-10-CM | POA: Diagnosis not present

## 2022-02-19 DIAGNOSIS — Z043 Encounter for examination and observation following other accident: Secondary | ICD-10-CM | POA: Diagnosis not present

## 2022-02-19 DIAGNOSIS — Z85828 Personal history of other malignant neoplasm of skin: Secondary | ICD-10-CM

## 2022-02-19 DIAGNOSIS — Z7401 Bed confinement status: Secondary | ICD-10-CM | POA: Diagnosis not present

## 2022-02-19 DIAGNOSIS — Z79899 Other long term (current) drug therapy: Secondary | ICD-10-CM

## 2022-02-19 DIAGNOSIS — I82409 Acute embolism and thrombosis of unspecified deep veins of unspecified lower extremity: Secondary | ICD-10-CM | POA: Diagnosis present

## 2022-02-19 DIAGNOSIS — Z7902 Long term (current) use of antithrombotics/antiplatelets: Secondary | ICD-10-CM

## 2022-02-19 DIAGNOSIS — S32019D Unspecified fracture of first lumbar vertebra, subsequent encounter for fracture with routine healing: Secondary | ICD-10-CM | POA: Diagnosis not present

## 2022-02-19 DIAGNOSIS — M79606 Pain in leg, unspecified: Secondary | ICD-10-CM | POA: Diagnosis not present

## 2022-02-19 DIAGNOSIS — L97919 Non-pressure chronic ulcer of unspecified part of right lower leg with unspecified severity: Secondary | ICD-10-CM | POA: Diagnosis present

## 2022-02-19 DIAGNOSIS — M109 Gout, unspecified: Secondary | ICD-10-CM | POA: Diagnosis present

## 2022-02-19 DIAGNOSIS — I6782 Cerebral ischemia: Secondary | ICD-10-CM | POA: Diagnosis not present

## 2022-02-19 DIAGNOSIS — N261 Atrophy of kidney (terminal): Secondary | ICD-10-CM | POA: Diagnosis not present

## 2022-02-19 DIAGNOSIS — S199XXA Unspecified injury of neck, initial encounter: Secondary | ICD-10-CM | POA: Diagnosis not present

## 2022-02-19 DIAGNOSIS — S32018A Other fracture of first lumbar vertebra, initial encounter for closed fracture: Secondary | ICD-10-CM | POA: Diagnosis not present

## 2022-02-19 DIAGNOSIS — Z888 Allergy status to other drugs, medicaments and biological substances status: Secondary | ICD-10-CM

## 2022-02-19 DIAGNOSIS — S32028D Other fracture of second lumbar vertebra, subsequent encounter for fracture with routine healing: Secondary | ICD-10-CM | POA: Diagnosis not present

## 2022-02-19 DIAGNOSIS — Z833 Family history of diabetes mellitus: Secondary | ICD-10-CM

## 2022-02-19 DIAGNOSIS — S2242XA Multiple fractures of ribs, left side, initial encounter for closed fracture: Secondary | ICD-10-CM | POA: Diagnosis not present

## 2022-02-19 DIAGNOSIS — Z7901 Long term (current) use of anticoagulants: Secondary | ICD-10-CM

## 2022-02-19 DIAGNOSIS — M4319 Spondylolisthesis, multiple sites in spine: Secondary | ICD-10-CM | POA: Diagnosis not present

## 2022-02-19 LAB — COMPREHENSIVE METABOLIC PANEL
ALT: 11 U/L (ref 0–44)
AST: 14 U/L — ABNORMAL LOW (ref 15–41)
Albumin: 3.1 g/dL — ABNORMAL LOW (ref 3.5–5.0)
Alkaline Phosphatase: 37 U/L — ABNORMAL LOW (ref 38–126)
Anion gap: 7 (ref 5–15)
BUN: 43 mg/dL — ABNORMAL HIGH (ref 8–23)
CO2: 25 mmol/L (ref 22–32)
Calcium: 9.3 mg/dL (ref 8.9–10.3)
Chloride: 110 mmol/L (ref 98–111)
Creatinine, Ser: 0.81 mg/dL (ref 0.44–1.00)
GFR, Estimated: >60 mL/min (ref >60)
Glucose, Bld: 142 mg/dL — ABNORMAL HIGH (ref 70–99)
Potassium: 3.8 mmol/L (ref 3.5–5.1)
Sodium: 142 mmol/L (ref 135–145)
Total Bilirubin: 0.6 mg/dL (ref 0.3–1.2)
Total Protein: 5.9 g/dL — ABNORMAL LOW (ref 6.5–8.1)

## 2022-02-19 LAB — RETICULOCYTES
Immature Retic Fract: 32.6 % — ABNORMAL HIGH (ref 2.3–15.9)
RBC.: 3.02 MIL/uL — ABNORMAL LOW (ref 3.87–5.11)
Retic Count, Absolute: 89.1 10*3/uL (ref 19.0–186.0)
Retic Ct Pct: 3 % (ref 0.4–3.1)

## 2022-02-19 LAB — CBC WITH DIFFERENTIAL/PLATELET
Abs Immature Granulocytes: 0.03 10*3/uL (ref 0.00–0.07)
Basophils Absolute: 0.1 10*3/uL (ref 0.0–0.1)
Basophils Relative: 1 %
Eosinophils Absolute: 0.4 10*3/uL (ref 0.0–0.5)
Eosinophils Relative: 5 %
HCT: 29.4 % — ABNORMAL LOW (ref 36.0–46.0)
Hemoglobin: 9 g/dL — ABNORMAL LOW (ref 12.0–15.0)
Immature Granulocytes: 0 %
Lymphocytes Relative: 34 %
Lymphs Abs: 2.8 10*3/uL (ref 0.7–4.0)
MCH: 30 pg (ref 26.0–34.0)
MCHC: 30.6 g/dL (ref 30.0–36.0)
MCV: 98 fL (ref 80.0–100.0)
Monocytes Absolute: 0.8 10*3/uL (ref 0.1–1.0)
Monocytes Relative: 10 %
Neutro Abs: 4.1 10*3/uL (ref 1.7–7.7)
Neutrophils Relative %: 50 %
Platelets: 193 10*3/uL (ref 150–400)
RBC: 3 MIL/uL — ABNORMAL LOW (ref 3.87–5.11)
RDW: 15.2 % (ref 11.5–15.5)
WBC: 8.3 10*3/uL (ref 4.0–10.5)
nRBC: 0 % (ref 0.0–0.2)

## 2022-02-19 LAB — CK: Total CK: 56 U/L (ref 38–234)

## 2022-02-19 LAB — TSH: TSH: 1.134 u[IU]/mL (ref 0.350–4.500)

## 2022-02-19 LAB — VITAMIN B12: Vitamin B-12: 450 pg/mL (ref 180–914)

## 2022-02-19 LAB — PHOSPHORUS: Phosphorus: 3.1 mg/dL (ref 2.5–4.6)

## 2022-02-19 LAB — IRON AND TIBC
Iron: 78 ug/dL (ref 28–170)
Saturation Ratios: 21 % (ref 10.4–31.8)
TIBC: 376 ug/dL (ref 250–450)
UIBC: 298 ug/dL

## 2022-02-19 LAB — MAGNESIUM: Magnesium: 2.1 mg/dL (ref 1.7–2.4)

## 2022-02-19 LAB — FERRITIN: Ferritin: 15 ng/mL (ref 11–307)

## 2022-02-19 LAB — FOLATE: Folate: 23.5 ng/mL (ref >5.9)

## 2022-02-19 MED ORDER — CARVEDILOL 25 MG PO TABS
37.5000 mg | ORAL_TABLET | Freq: Two times a day (BID) | ORAL | Status: DC
  Administered 2022-02-19 – 2022-02-21 (×4): 37.5 mg via ORAL
  Filled 2022-02-19 (×4): qty 1

## 2022-02-19 MED ORDER — RIVAROXABAN (XARELTO) VTE STARTER PACK (15 & 20 MG): 15.0000 mg | ORAL_TABLET | ORAL | Status: DC

## 2022-02-19 MED ORDER — RIVAROXABAN 20 MG PO TABS: 20.0000 mg | ORAL_TABLET | Freq: Every day | ORAL | Status: DC

## 2022-02-19 MED ORDER — ACETAMINOPHEN ER 650 MG PO TBCR: 1300.0000 mg | EXTENDED_RELEASE_TABLET | Freq: Three times a day (TID) | ORAL | Status: DC

## 2022-02-19 MED ORDER — OXYCODONE-ACETAMINOPHEN 5-325 MG PO TABS
1.0000 | ORAL_TABLET | Freq: Once | ORAL | Status: DC
  Filled 2022-02-19: qty 1

## 2022-02-19 MED ORDER — INSULIN ASPART 100 UNIT/ML IJ SOLN
0.0000 [IU] | INTRAMUSCULAR | Status: DC
  Filled 2022-02-19: qty 0.09

## 2022-02-19 MED ORDER — EZETIMIBE 10 MG PO TABS
10.0000 mg | ORAL_TABLET | Freq: Every day | ORAL | Status: DC
  Administered 2022-02-19 – 2022-02-21 (×3): 10 mg via ORAL
  Filled 2022-02-19 (×3): qty 1

## 2022-02-19 MED ORDER — GABAPENTIN 300 MG PO CAPS
300.0000 mg | ORAL_CAPSULE | Freq: Every day | ORAL | Status: DC
  Administered 2022-02-19 – 2022-02-20 (×2): 300 mg via ORAL
  Filled 2022-02-19 (×2): qty 1

## 2022-02-19 MED ORDER — ACETAMINOPHEN 325 MG PO TABS
650.0000 mg | ORAL_TABLET | Freq: Once | ORAL | Status: AC
  Administered 2022-02-19: 650 mg via ORAL
  Filled 2022-02-19: qty 2

## 2022-02-19 MED ORDER — TRAMADOL HCL 50 MG PO TABS
50.0000 mg | ORAL_TABLET | Freq: Four times a day (QID) | ORAL | Status: DC | PRN
  Administered 2022-02-20 – 2022-02-21 (×4): 50 mg via ORAL
  Filled 2022-02-19 (×4): qty 1

## 2022-02-19 MED ORDER — SODIUM CHLORIDE 0.9 % IV BOLUS
500.0000 mL | Freq: Once | INTRAVENOUS | Status: AC
  Administered 2022-02-19: 500 mL via INTRAVENOUS

## 2022-02-19 MED ORDER — INSULIN ASPART 100 UNIT/ML IJ SOLN
0.0000 [IU] | Freq: Three times a day (TID) | INTRAMUSCULAR | Status: DC
  Administered 2022-02-20: 2 [IU] via SUBCUTANEOUS
  Administered 2022-02-20: 1 [IU] via SUBCUTANEOUS
  Administered 2022-02-20 – 2022-02-21 (×2): 2 [IU] via SUBCUTANEOUS
  Administered 2022-02-21 (×2): 1 [IU] via SUBCUTANEOUS

## 2022-02-19 MED ORDER — ACETAMINOPHEN 325 MG PO TABS
650.0000 mg | ORAL_TABLET | Freq: Four times a day (QID) | ORAL | Status: DC | PRN
  Administered 2022-02-20: 650 mg via ORAL
  Filled 2022-02-19 (×2): qty 2

## 2022-02-19 MED ORDER — ACETAMINOPHEN 650 MG RE SUPP: 650.0000 mg | Freq: Four times a day (QID) | RECTAL | Status: DC | PRN

## 2022-02-19 MED ORDER — RIVAROXABAN 15 MG PO TABS
15.0000 mg | ORAL_TABLET | Freq: Two times a day (BID) | ORAL | Status: DC
  Administered 2022-02-19 – 2022-02-21 (×4): 15 mg via ORAL
  Filled 2022-02-19 (×4): qty 1

## 2022-02-19 MED ORDER — SODIUM CHLORIDE 0.9 % IV SOLN
75.0000 mL/h | INTRAVENOUS | Status: AC
  Administered 2022-02-19: 75 mL/h via INTRAVENOUS

## 2022-02-19 MED ORDER — FENTANYL CITRATE PF 50 MCG/ML IJ SOSY: 25.0000 ug | PREFILLED_SYRINGE | INTRAMUSCULAR | Status: DC | PRN

## 2022-02-19 NOTE — Telephone Encounter (Signed)
Pt. Called just to let you know that she fell yesterday and is going to go to [facility] just to get checked out. I told her [facility] usually has a shorter wait time then [facility]. She just said her leg hurt the one that had the blood clot.

## 2022-02-19 NOTE — Assessment & Plan Note (Signed)
Chronic stable continue Zetia

## 2022-02-19 NOTE — Assessment & Plan Note (Signed)
Chronic not taking medications order sliding scale

## 2022-02-19 NOTE — Assessment & Plan Note (Signed)
Obtain anemia panel Hemoccult stool patient denies any blood in stool or melena.  Status post hysterectomy

## 2022-02-19 NOTE — ED Provider Notes (Signed)
[facility] DEPT Provider Note   CSN: [id number] Arrival date & time: 02/19/22  1417     History  Chief Complaint  Patient presents with   Fall   Hip Pain   Shoulder Pain    Kristin Willis is a 88 y.o. female.  Patient fell yesterday and unable to get up.  She was on the floor 90 minutes.  Paramedics came to take her to the emergency department but she refused.  She was unable to walk today she complains of right hip pain left shoulder pain and back pain.  Patient has a history of a DVT and is on Xarelto  The history is provided by the patient and medical records. No language interpreter was used.  Fall This is a new problem. The problem occurs rarely. The problem has been resolved. Pertinent negatives include no chest pain, no abdominal pain and no headaches. Exacerbated by: Movement. Nothing relieves the symptoms. She has tried nothing for the symptoms. The treatment provided no relief.  Hip Pain Pertinent negatives include no chest pain, no abdominal pain and no headaches.  Shoulder Pain Associated symptoms: back pain   Associated symptoms: no fatigue       Home Medications Prior to Admission medications   Medication Sig Start Date End Date Taking? Authorizing Provider  Acetaminophen (TYLENOL ARTHRITIS PAIN PO) Take 2 tablets by mouth 2 (two) times daily.    [provider]  Apoaequorin (PREVAGEN PO) Take 1 tablet by mouth daily.    [provider]  Ascorbic Acid (VITAMIN C) 500 MG CAPS Take 1 tablet by mouth daily.    [provider]  Blood Glucose Monitoring Suppl (ACCU-CHEK GUIDE) w/Device KIT 1 each by Does not apply route 2 (two) times daily. Patient not taking: Reported on 12/25/2021 07/02/17   Rita Ohara, MD  Calcium Carbonate-Vitamin D (CALTRATE 600+D PO) Take 2 tablets by mouth daily.     [provider]  carvedilol (COREG) 25 MG tablet TAKE 1 AND 1/2 TABLETS BY MOUTH TWICE DAILY 01/20/22   Rita Ohara,  MD  cephALEXin (KEFLEX) 500 MG capsule Take 1 tablet prior to dental procedure, take second tablet evening after dental procedure Patient not taking: Reported on 12/25/2021 08/06/15   Rita Ohara, MD  cholecalciferol (VITAMIN D) 1000 units tablet Take 1,000 Units by mouth daily.    [provider]  clopidogrel (PLAVIX) 75 MG tablet TAKE 1 TABLET BY MOUTH EVERY DAY 11/18/21   Rita Ohara, MD  Coenzyme Q10 (CO Q 10 PO) Take 1 tablet by mouth daily.     [provider]  ezetimibe (ZETIA) 10 MG tablet TAKE 1 TABLET BY MOUTH EVERY DAY 11/05/21   Rita Ohara, MD  FREESTYLE LITE test strip TEST BLOOD SUGAR TWICE DAILY Patient not taking: Reported on 12/25/2021 04/01/19   Rita Ohara, MD  gabapentin (NEURONTIN) 300 MG capsule TAKE 1 CAPSULE BY MOUTH EVERY DAY AT BEDTIME 09/12/21   Rita Ohara, MD  hydrochlorothiazide (HYDRODIURIL) 12.5 MG tablet TAKE 1 TABLET(12.5 MG) BY MOUTH DAILY 12/16/21   Rita Ohara, MD  KLOR-CON M10 10 MEQ tablet TAKE 1 TABLET BY MOUTH EVERY DAY 12/16/21   Rita Ohara, MD  Lancets Micro Thin 33G MISC 1 each by Does not apply route 2 (two) times daily. Patient not taking: Reported on 12/25/2021 11/14/19   Rita Ohara, MD  losartan (COZAAR) 100 MG tablet TAKE 1 TABLET BY MOUTH EVERY DAY 08/07/21   Rita Ohara, MD  Misc Natural Products (  OSTEO BI-FLEX ADV TRIPLE ST PO) Take 2 capsules by mouth daily.    [provider]  Multiple Vitamin (MULTIVITAMIN) tablet Take 1 tablet by mouth daily.    [provider]  nitrofurantoin, macrocrystal-monohydrate, (MACROBID) 100 MG capsule Take 1 capsule (100 mg total) by mouth 2 (two) times daily. Patient not taking: Reported on 02/11/2022 12/30/21   Rita Ohara, MD  Omega-3 Fatty Acids (FISH OIL) 1000 MG CAPS Take 1 capsule by mouth 2 (two) times daily.    [provider]  solifenacin (VESICARE) 5 MG tablet Take 5 mg by mouth daily. 05/07/19   [provider]  Specialty Vitamins Products ([employer] HAIR/SKIN/NAILS PO) Take 2  each by mouth daily.    [provider]  triamcinolone cream (KENALOG) 0.1 % Apply 1 application. topically 2 (two) times daily. 01/09/22   Rita Ohara, MD  TURMERIC PO Take 1 tablet by mouth daily.    [provider]  Zinc 50 MG CAPS Take 1 capsule by mouth daily. Patient not taking: Reported on 12/25/2021    [provider]  potassium chloride (K-DUR) 10 MEQ tablet TAKE 1 TABLET BY MOUTH EVERY DAY 07/27/13 01/23/14  Rita Ohara, MD      Allergies    Ciprofloxacin, Codeine, Contrast media [iodinated contrast media], Morphine and related, Statins, Toprol xl [metoprolol tartrate], Clindamycin/lincomycin, Penicillins, and Prednisone    Review of Systems   Review of Systems  Constitutional:  Negative for appetite change and fatigue.  HENT:  Negative for congestion, ear discharge and sinus pressure.   Eyes:  Negative for discharge.  Respiratory:  Negative for cough.   Cardiovascular:  Negative for chest pain.  Gastrointestinal:  Negative for abdominal pain and diarrhea.  Genitourinary:  Negative for frequency and hematuria.  Musculoskeletal:  Positive for back pain.       Right hip pain  Skin:  Negative for rash.  Neurological:  Negative for seizures and headaches.  Psychiatric/Behavioral:  Negative for hallucinations.    Physical Exam Updated Vital Signs BP (!) 139/114   Pulse 100   Temp 97.6 F (36.4 C) (Oral)   Resp 18   SpO2 99%  Physical Exam Vitals and nursing note reviewed.  Constitutional:      Appearance: She is well-developed.  HENT:     Head: Normocephalic.     Nose: Nose normal.  Eyes:     General: No scleral icterus.    Conjunctiva/sclera: Conjunctivae normal.  Neck:     Thyroid: No thyromegaly.  Cardiovascular:     Rate and Rhythm: Normal rate and regular rhythm.     Heart sounds: No murmur heard.   No friction rub. No gallop.  Pulmonary:     Breath sounds: No stridor. No wheezing or rales.  Chest:     Chest wall: No tenderness.   Abdominal:     General: There is no distension.     Tenderness: There is no abdominal tenderness. There is no rebound.  Musculoskeletal:     Cervical back: Neck supple.     Comments: Tender right hip, and lumbar spine.   Patient has bruising to left upper back  Lymphadenopathy:     Cervical: No cervical adenopathy.  Skin:    Findings: No erythema or rash.  Neurological:     Mental Status: She is alert and oriented to person, place, and time.     Motor: No abnormal muscle tone.     Coordination: Coordination normal.  Psychiatric:  Behavior: Behavior normal.    ED Results / Procedures / Treatments   Labs (all labs ordered are listed, but only abnormal results are displayed) Labs Reviewed  CBC WITH DIFFERENTIAL/PLATELET - Abnormal; Notable for the following components:      Result Value   RBC 3.00 (*)    Hemoglobin 9.0 (*)    HCT 29.4 (*)    All other components within normal limits  COMPREHENSIVE METABOLIC PANEL - Abnormal; Notable for the following components:   Glucose, Bld 142 (*)    BUN 43 (*)    Total Protein 5.9 (*)    Albumin 3.1 (*)    AST 14 (*)    Alkaline Phosphatase 37 (*)    All other components within normal limits  CK  CREATININE, URINE, RANDOM  MAGNESIUM  OSMOLALITY, URINE  PHOSPHORUS  PREALBUMIN  TSH  URINALYSIS, COMPLETE (UACMP) WITH MICROSCOPIC    EKG None  Radiology CT Head Wo Contrast  Result Date: 02/19/2022 CLINICAL DATA:  Head trauma, intracranial arterial injury suspected; Neck trauma (Age >= 65y) EXAM: CT HEAD WITHOUT CONTRAST CT CERVICAL SPINE WITHOUT CONTRAST TECHNIQUE: Multidetector CT imaging of the head and cervical spine was performed following the standard protocol without intravenous contrast. Multiplanar CT image reconstructions of the cervical spine were also generated. RADIATION DOSE REDUCTION: This exam was performed according to the departmental dose-optimization program which includes automated exposure control,  adjustment of the mA and/or kV according to patient size and/or use of iterative reconstruction technique. COMPARISON:  None Available. FINDINGS: CT HEAD FINDINGS Brain: No evidence of acute infarction, hemorrhage, hydrocephalus, or extra-axial collection. Approximately 9 mm area of calcification along the inner table of the calvarium along the lower right frontal convexity. No significant mass effect or adjacent edema. Patchy white matter hypodensities, nonspecific but compatible with chronic microvascular ischemic disease. Vascular: No hyperdense vessel identified. Calcific intracranial atherosclerosis. Skull: No evidence of acute fracture. Sinuses/Orbits: Visualized sinuses are clear. No acute orbital findings. Other: No mastoid effusions. CT CERVICAL SPINE FINDINGS Alignment: Mild anterolisthesis of C3 on C4, C4 on C5 and T1 on T2, favored degenerative in etiology given facet arthropathy at these levels. Otherwise, no substantial sagittal subluxation. Skull base and vertebrae: Vertebral body heights are maintained. No evidence of acute fracture. Soft tissues and spinal canal: No prevertebral fluid or swelling. No visible canal hematoma. Disc levels: Multilevel degenerative disc disease, greatest at C4-C5. Multilevel facet and uncovertebral hypertrophy with varying degrees of neural foraminal stenosis Upper chest: Visualized lung apices are clear. IMPRESSION: CT head: 1. No evidence of acute intracranial abnormality. 2. Chronic microvascular ischemic disease. 3. Approximately 9 mm area of calcification along the lower right frontal convexity, possibly a calcified meningioma versus dural calcification. No significant mass effect or adjacent edema. CT cervical spine: 1. No evidence of acute fracture or traumatic malalignment. 2. Multilevel degenerative change, detailed above. Electronically Signed   By: Margaretha Sheffield M.D.   On: 02/19/2022 16:52   CT Cervical Spine Wo Contrast  Result Date:  02/19/2022 CLINICAL DATA:  Head trauma, intracranial arterial injury suspected; Neck trauma (Age >= 65y) EXAM: CT HEAD WITHOUT CONTRAST CT CERVICAL SPINE WITHOUT CONTRAST TECHNIQUE: Multidetector CT imaging of the head and cervical spine was performed following the standard protocol without intravenous contrast. Multiplanar CT image reconstructions of the cervical spine were also generated. RADIATION DOSE REDUCTION: This exam was performed according to the departmental dose-optimization program which includes automated exposure control, adjustment of the mA and/or kV according to patient size and/or use  of iterative reconstruction technique. COMPARISON:  None Available. FINDINGS: CT HEAD FINDINGS Brain: No evidence of acute infarction, hemorrhage, hydrocephalus, or extra-axial collection. Approximately 9 mm area of calcification along the inner table of the calvarium along the lower right frontal convexity. No significant mass effect or adjacent edema. Patchy white matter hypodensities, nonspecific but compatible with chronic microvascular ischemic disease. Vascular: No hyperdense vessel identified. Calcific intracranial atherosclerosis. Skull: No evidence of acute fracture. Sinuses/Orbits: Visualized sinuses are clear. No acute orbital findings. Other: No mastoid effusions. CT CERVICAL SPINE FINDINGS Alignment: Mild anterolisthesis of C3 on C4, C4 on C5 and T1 on T2, favored degenerative in etiology given facet arthropathy at these levels. Otherwise, no substantial sagittal subluxation. Skull base and vertebrae: Vertebral body heights are maintained. No evidence of acute fracture. Soft tissues and spinal canal: No prevertebral fluid or swelling. No visible canal hematoma. Disc levels: Multilevel degenerative disc disease, greatest at C4-C5. Multilevel facet and uncovertebral hypertrophy with varying degrees of neural foraminal stenosis Upper chest: Visualized lung apices are clear. IMPRESSION: CT head: 1. No  evidence of acute intracranial abnormality. 2. Chronic microvascular ischemic disease. 3. Approximately 9 mm area of calcification along the lower right frontal convexity, possibly a calcified meningioma versus dural calcification. No significant mass effect or adjacent edema. CT cervical spine: 1. No evidence of acute fracture or traumatic malalignment. 2. Multilevel degenerative change, detailed above. Electronically Signed   By: Margaretha Sheffield M.D.   On: 02/19/2022 16:52   CT HIP RIGHT WO CONTRAST  Result Date: 02/19/2022 CLINICAL DATA:  Right hip pain after fall EXAM: CT OF THE RIGHT HIP WITHOUT CONTRAST TECHNIQUE: Multidetector CT imaging of the right hip was performed according to the standard protocol. Multiplanar CT image reconstructions were also generated. RADIATION DOSE REDUCTION: This exam was performed according to the departmental dose-optimization program which includes automated exposure control, adjustment of the mA and/or kV according to patient size and/or use of iterative reconstruction technique. COMPARISON:  X-ray 02/19/2022 FINDINGS: Bones/Joint/Cartilage No acute fracture. No dislocation. Severe, end-stage degenerative changes of the right hip joint with complete joint space loss, subchondral sclerosis/cystic change, and bulky marginal osteophyte formation. There is axial migration of the humeral head with remodeling of the medial wall of the acetabulum. No large hip joint effusion. Severe osteoarthritis of the pubic symphysis. The pubic symphysis and right SI joint are intact without diastasis. No suspicious lytic or sclerotic bone lesion. Ligaments Suboptimally assessed by CT. Muscles and Tendons No acute musculotendinous abnormality by CT. Generalized muscle atrophy. Soft tissues Mild induration within the subcutaneous fat overlying the hip. No hematoma. No right inguinal lymphadenopathy. Fat containing lower ventral abdominal wall hernia. Diverticular changes of the visualized  sigmoid colon. IMPRESSION: 1. No acute fracture or dislocation of the right hip. 2. Severe, end-stage degenerative changes of the right hip joint. Electronically Signed   By: Davina Poke D.O.   On: 02/19/2022 16:58   CT T-SPINE NO CHARGE  Result Date: 02/19/2022 CLINICAL DATA:  Trauma EXAM: CT THORACIC AND LUMBAR SPINE WITHOUT CONTRAST TECHNIQUE: Multidetector CT imaging of the thoracic and lumbar spine was performed without contrast. Multiplanar CT image reconstructions were also generated. RADIATION DOSE REDUCTION: This exam was performed according to the departmental dose-optimization program which includes automated exposure control, adjustment of the mA and/or kV according to patient size and/or use of iterative reconstruction technique. COMPARISON:  None Available. FINDINGS: CT THORACIC SPINE FINDINGS Alignment: There is degenerative anterolisthesis at T1-T2 and T2-T3. Vertebrae: There is no acute thoracic spine  fracture. There is no aggressive osseous lesion. There is a nondisplaced left posterior twelfth rib fracture (series 4, image 138). Paraspinal and other soft tissues: Reported separately on CT chest. Disc levels: There is mild-to-moderate multilevel degenerative disc disease and facet arthropathy. No visible impingement by CT. CT LUMBAR SPINE FINDINGS Segmentation: There are 5 non-rib-bearing lumbar vertebrae. Alignment: Levoconvex lumbar curvature. There is grade 1 anterolisthesis at L3-L4. Vertebrae: Nondisplaced left L1 transverse process fracture. There is a displaced left L2 transverse process fracture. No acute vertebral body compression fracture. There is no aggressive osseous lesion. Pseudoarticulation of the L5 transverse processes with the sacrum. Paraspinal and other soft tissues: Reported separately on CT abdomen and pelvis. Left renal atrophy. Prominence of the left renal collecting system. Nonobstructive right renal stones. Disc levels: Moderate severe multilevel degenerative disc  disease and severe multilevel facet arthropathy. Varying degrees of spinal canal and neural foraminal stenosis. Probable moderate to severe spinal canal stenosis at L3-L4 and L4-L5. Severe left neural foraminal stenosis at L4-L5. IMPRESSION: CT THORACIC SPINE IMPRESSION Nondisplaced left posterior twelfth rib fracture. No acute thoracic spine fracture. CT LUMBAR SPINE IMPRESSION Nondisplaced left L1 and displaced left L2 transverse process fractures. No vertebral body fracture. Levoconvex lumbar curvature. Moderate to severe multilevel degenerative disc disease with varying degrees of spinal canal neural foraminal stenosis. Probable moderate-severe spinal canal stenosis at L3-L4 and L4-L5. Severe left neural foraminal stenosis at L4-L5. Electronically Signed   By: Maurine Simmering M.D.   On: 02/19/2022 16:59   CT L-SPINE NO CHARGE  Result Date: 02/19/2022 CLINICAL DATA:  Trauma EXAM: CT THORACIC AND LUMBAR SPINE WITHOUT CONTRAST TECHNIQUE: Multidetector CT imaging of the thoracic and lumbar spine was performed without contrast. Multiplanar CT image reconstructions were also generated. RADIATION DOSE REDUCTION: This exam was performed according to the departmental dose-optimization program which includes automated exposure control, adjustment of the mA and/or kV according to patient size and/or use of iterative reconstruction technique. COMPARISON:  None Available. FINDINGS: CT THORACIC SPINE FINDINGS Alignment: There is degenerative anterolisthesis at T1-T2 and T2-T3. Vertebrae: There is no acute thoracic spine fracture. There is no aggressive osseous lesion. There is a nondisplaced left posterior twelfth rib fracture (series 4, image 138). Paraspinal and other soft tissues: Reported separately on CT chest. Disc levels: There is mild-to-moderate multilevel degenerative disc disease and facet arthropathy. No visible impingement by CT. CT LUMBAR SPINE FINDINGS Segmentation: There are 5 non-rib-bearing lumbar vertebrae.  Alignment: Levoconvex lumbar curvature. There is grade 1 anterolisthesis at L3-L4. Vertebrae: Nondisplaced left L1 transverse process fracture. There is a displaced left L2 transverse process fracture. No acute vertebral body compression fracture. There is no aggressive osseous lesion. Pseudoarticulation of the L5 transverse processes with the sacrum. Paraspinal and other soft tissues: Reported separately on CT abdomen and pelvis. Left renal atrophy. Prominence of the left renal collecting system. Nonobstructive right renal stones. Disc levels: Moderate severe multilevel degenerative disc disease and severe multilevel facet arthropathy. Varying degrees of spinal canal and neural foraminal stenosis. Probable moderate to severe spinal canal stenosis at L3-L4 and L4-L5. Severe left neural foraminal stenosis at L4-L5. IMPRESSION: CT THORACIC SPINE IMPRESSION Nondisplaced left posterior twelfth rib fracture. No acute thoracic spine fracture. CT LUMBAR SPINE IMPRESSION Nondisplaced left L1 and displaced left L2 transverse process fractures. No vertebral body fracture. Levoconvex lumbar curvature. Moderate to severe multilevel degenerative disc disease with varying degrees of spinal canal neural foraminal stenosis. Probable moderate-severe spinal canal stenosis at L3-L4 and L4-L5. Severe left neural foraminal stenosis at L4-L5.  Electronically Signed   By: Maurine Simmering M.D.   On: 02/19/2022 16:59   DG Shoulder Left  Result Date: 02/19/2022 CLINICAL DATA:  Status post fall, shoulder pain EXAM: LEFT SHOULDER - 2+ VIEW COMPARISON:  None Available. FINDINGS: No acute fracture or dislocation. No aggressive osseous lesion. Normal alignment. Mild arthropathy of the acromioclavicular joint. Amorphous calcification adjacent to the greater tuberosity likely reflecting mild calcific tendinosis. Soft tissue are unremarkable. No radiopaque foreign body or soft tissue emphysema. IMPRESSION: 1. No acute osseous injury of the left  shoulder. Electronically Signed   By: Kathreen Devoid M.D.   On: 02/19/2022 15:30   DG Hip Unilat W or Wo Pelvis 2-3 Views Right  Result Date: 02/19/2022 CLINICAL DATA:  Fall.  RIGHT hip pain EXAM: DG HIP (WITH OR WITHOUT PELVIS) 2-3V RIGHT COMPARISON:  07/02/2021 FINDINGS: Hips are located. There is severe osteophytosis of the RIGHT femoral neck similar to comparison exam. There is loss of joint space and sclerosis. No evidence of traumatic fracture. No pelvic fracture or sacral fracture. IMPRESSION: 1. Severe arthropathy of the RIGHT hip joint similar comparison exam. 2. No radiographic evidence of fracture. RIGHT femoral neck partially obscured by osteophytes. If there is a high clinical suspicion of hip fracture consider CT. Electronically Signed   By: Suzy Bouchard M.D.   On: 02/19/2022 15:35   CT CHEST ABDOMEN PELVIS WO CONTRAST  Result Date: 02/19/2022 CLINICAL DATA:  Status post fall, blunt abdominal trauma EXAM: CT CHEST, ABDOMEN AND PELVIS WITHOUT CONTRAST TECHNIQUE: Multidetector CT imaging of the chest, abdomen and pelvis was performed following the standard protocol without IV contrast. RADIATION DOSE REDUCTION: This exam was performed according to the departmental dose-optimization program which includes automated exposure control, adjustment of the mA and/or kV according to patient size and/or use of iterative reconstruction technique. COMPARISON:  None Available. FINDINGS: CT CHEST FINDINGS Cardiovascular: No significant vascular findings. Normal heart size. No pericardial effusion. Thoracic aortic atherosclerosis. Coronary artery atherosclerosis. Mediastinum/Nodes: No enlarged mediastinal, hilar, or axillary lymph nodes. Thyroid gland, trachea, and esophagus demonstrate no significant findings. Lungs/Pleura: No focal consolidation, pleural effusion or pneumothorax. Mild right apical scarring. Reticulonodular interstitial disease in the periphery of the right middle lobe as can be seen with an  infectious or inflammatory etiology including MAI. Mild lingular scarring versus atelectasis. Musculoskeletal: No acute osseous abnormality. Generalized osteopenia. CT ABDOMEN PELVIS FINDINGS Lower chest: No acute abnormality. Hepatobiliary: No focal liver abnormality is seen. No gallstones, gallbladder wall thickening, or biliary dilatation. Pancreas: Unremarkable. No pancreatic ductal dilatation or surrounding inflammatory changes. Spleen: Normal in size without focal abnormality. Adrenals/Urinary Tract: Adrenal glands are unremarkable. Multiple nonobstructing right renal calculi with the largest measuring 9 mm. 4 cm right renal cyst for which no follow-up imaging is recommended. Atrophic left kidney. Prominent left extrarenal pelvis. Normal bladder. Stomach/Bowel: Stomach is within normal limits. No evidence of bowel wall thickening, distention, or inflammatory changes. Diverticulosis without evidence of diverticulitis. Vascular/Lymphatic: Aortic atherosclerosis. No enlarged abdominal or pelvic lymph nodes. Reproductive: Status post hysterectomy. No adnexal masses. Other: Fat containing right inguinal hernia. Fat containing umbilical hernia. Musculoskeletal: No acute osseous abnormality. No aggressive osseous lesion. Severe osteoarthritis of the right hip. Mild osteoarthritis of the left hip. Diffuse lumbar spine spondylosis. IMPRESSION: 1. No acute traumatic injury to the chest, abdomen or pelvis. 2. Reticulonodular interstitial disease in the periphery of the right middle lobe as can be seen with an infectious or inflammatory etiology including MAI. 3. Diverticulosis without evidence of diverticulitis. 4. Multiple  nonobstructing right renal calculi with the largest measuring 9 mm. 5. Severe osteoarthritis of the right hip. 6. Aortic Atherosclerosis (ICD10-I70.0). Electronically Signed   By: Kathreen Devoid M.D.   On: 02/19/2022 16:56    Procedures Procedures    Medications Ordered in ED Medications   oxyCODONE-acetaminophen (PERCOCET/ROXICET) 5-325 MG per tablet 1 tablet (1 tablet Oral Patient Refused/Not Given 02/19/22 1744)  sodium chloride 0.9 % bolus 500 mL (0 mLs Intravenous Stopped 02/19/22 1838)  acetaminophen (TYLENOL) tablet 650 mg (650 mg Oral Given 02/19/22 1838)    ED Course/ Medical Decision Making/ A&P     I spoke with neurosurgery about her L1 and L2 transverse process fractures and they recommended pain control and physical therapy                         Medical Decision Making Amount and/or Complexity of Data Reviewed Labs: ordered. Radiology: ordered.  Risk OTC drugs. Prescription drug management. Decision regarding hospitalization.  Patient with L1 and L2 transverse process fractures along with a fractured rib.  And contusion to right hip.  She will be admitted for pain control and physical therapy        Final Clinical Impression(s) / ED Diagnoses Final diagnoses:  Fall, initial encounter    Rx / DC Orders ED Discharge Orders     None         Milton Ferguson, MD 02/19/22 Kathyrn Drown

## 2022-02-19 NOTE — Assessment & Plan Note (Signed)
PT OT assessment prior to discharge °

## 2022-02-19 NOTE — Assessment & Plan Note (Signed)
PT OT assessment pain management.  Patient does not wish to receive IV medications such as morphine because she is allergic could try for as needed fentanyl if needed Otherwise patient has been taking scheduled Tylenol will continue for now also will try p.o. Ultram

## 2022-02-19 NOTE — Subjective & Objective (Signed)
Pt lives alone, fell yesterday EMS came out but she refused transport  Since then she was unable to walk Per daughter she was on the floor for only a few hours  Severe pain in back and hip unable to walk

## 2022-02-19 NOTE — Assessment & Plan Note (Signed)
Known right lower extremity DVT.  Continue Xarelto.  Patient to have outpatient follow-up and work-up for possible recurrent cancer given unprovoked DVT

## 2022-02-19 NOTE — Assessment & Plan Note (Signed)
Continue to hold off Plavix patient is at risk of bleeding  has known history of anemia

## 2022-02-19 NOTE — ED Notes (Signed)
Patient transported to CT 

## 2022-02-19 NOTE — H&P (Signed)
Kristin Willis NMM:[id number] DOB: May 05, 1929 DOA: 02/19/2022     PCP: Rita Ohara, MD   Outpatient Specialists:     GI  Sadie Haber,  ) Buccini, Herbie [place], MD   Orth[facility] Patient arrived to ER on 02/19/22 at 1417 Referred by Attending Milton Ferguson, MD   Patient coming from:    home Lives alone,     Chief Complaint:   Chief Complaint  Patient presents with   Fall   Hip Pain   Shoulder Pain    HPI: Kristin Willis is a 88 y.o. female with medical history significant of DVT on Xarelto, anemia, DM2, HTN history of breast cancer, CVA, history of endometrial cancer  Presented with   pain right hip /left shoulder Pt lives alone, fell yesterday EMS came out but she refused transport  Since then she was unable to walk Per daughter she was on the floor for only a few hours  Severe pain in back and hip unable to walk    Denies head injury    Denies any melena or bright red blood per rectum reports she have had colonoscopies and mammograms scheduled.  Patient endorses dysuria UA ordered No results found for: SARSCOV2NAA   Regarding pertinent Chronic problems:    Hyperlipidemia - on [facility]et[facility] Lipid Panel     Component Value Date/Time   CHOL 147 06/20/2021 1538   TRIG 133 06/20/2021 1538   HDL 38 (L) 06/20/2021 1538   CHOLHDL 3.9 06/20/2021 1538   CHOLHDL 3.4 08/17/2017 1526   VLDL 54 (H) 02/16/2017 1210   [place] 85 06/20/2021 1538   LDLCALC 77 08/17/2017 1526   LABVLDL 24 06/20/2021 1538    HTN on Cozaar, coreg      DM 2 -  Lab Results  Component Value Date   HGBA1C 5.1 12/25/2021    diet controlled      Hx of CVA -  with/out residual deficits was on Plavix now stopped     Hx of DVT/PE on - anticoagulation with  Xarelto       Chronic anemia - baseline hg Hemoglobin & Hematocrit  Recent Labs    12/25/21 1323 01/21/22 1154 02/19/22 1615  HGB 9.9* 10.0* 9.0*    While in ER:   Right hip and pelvis no fracture Left shoulder no  fracture   Ordered  CT HEAD   NON acute CT cervical spine nonacute CT lumbar spine showing nondisplaced left L1 and displaced left L2 transverse process fractures CT thoracic 12th rib fracture CT right hip nonacute   CT chest abd/pelvis - nonacute, Reticulonodular interstitial disease in the periphery of the right middle lobe as can be seen with an infectious or inflammatory etiology including MAI.   Following Medications were ordered in ER: Medications  oxyCODONE-acetaminophen (PERCOCET/ROXICET) 5-325 MG per tablet 1 tablet (1 tablet Oral Patient Refused/Not Given 02/19/22 1744)  sodium chloride 0.9 % bolus 500 mL (0 mLs Intravenous Stopped 02/19/22 1838)  acetaminophen (TYLENOL) tablet 650 mg (650 mg Oral Given 02/19/22 1838)    _______________________________________________________ ER Provider Called:  Neurosurgery    They Recommend admit to medicine pain control follow-up as an outpatient   ED Triage Vitals [02/19/22 1430]  Enc Vitals Group     BP (!) 123/54     Pulse Rate 82     Resp 19     Temp 97.6 F (36.4 C)     Temp Source Oral     SpO2 99 %  Weight      Height      Head Circumference      Peak Flow      Pain Score      Pain Loc      Pain Edu?      Excl. in [place]?   BLTJ(03)@     _________________________________________ Significant initial  Findings: Abnormal Labs Reviewed  CBC WITH DIFFERENTIAL/PLATELET - Abnormal; Notable for the following components:      Result Value   RBC 3.00 (*)    Hemoglobin 9.0 (*)    HCT 29.4 (*)    All other components within normal limits  COMPREHENSIVE METABOLIC PANEL - Abnormal; Notable for the following components:   Glucose, Bld 142 (*)    BUN 43 (*)    Total Protein 5.9 (*)    Albumin 3.1 (*)    AST 14 (*)    Alkaline Phosphatase 37 (*)    All other components within normal limits       ECG: Ordered Personally reviewed by me showing: HR : 98 Rhythm:  Sinus rhythm Short PR interval Abnormal R-wave  progression, early transition QTC 443     The recent clinical data is shown below. Vitals:   02/19/22 1430 02/19/22 1651 02/19/22 1845  BP: (!) 123/54 (!) 147/77 (!) 139/114  Pulse: 82 99 100  Resp: 19 18 18   Temp: 97.6 F (36.4 C)    TempSrc: Oral    SpO2: 99% 97% 99%     WBC     Component Value Date/Time   WBC 8.3 02/19/2022 1615   LYMPHSABS 2.8 02/19/2022 1615   LYMPHSABS 2.5 01/21/2022 1154   LYMPHSABS 2.3 04/21/2013 1112   MONOABS 0.8 02/19/2022 1615   MONOABS 0.6 04/21/2013 1112   EOSABS 0.4 02/19/2022 1615   EOSABS 0.3 01/21/2022 1154   BASOSABS 0.1 02/19/2022 1615   BASOSABS 0.1 01/21/2022 1154   BASOSABS 0.1 04/21/2013 1112      UA  ordered    Results for orders placed or performed in visit on 12/25/21  Urine Culture     Status: Abnormal   Collection Time: 12/25/21  1:24 PM   Specimen: Urine   UR  Result Value Ref Range Status   Urine Culture, Routine Final report (A)  Final   Organism ID, Bacteria Klebsiella pneumoniae (A)  Final    Comment: Cefazolin <=4 ug/mL Cefazolin with an MIC <=16 predicts susceptibility to the oral agents cefaclor, cefdinir, cefpodoxime, cefprozil, cefuroxime, cephalexin, and loracarbef when used for therapy of uncomplicated urinary tract infections due to E. coli, Klebsiella pneumoniae, and Proteus mirabilis. Greater than 100,000 colony forming units per mL    ORGANISM ID, BACTERIA Not applicable  Final   Antimicrobial Susceptibility Comment  Final    Comment:       ** S = Susceptible; I = Intermediate; R = Resistant **                    P = Positive; N = Negative             MICS are expressed in micrograms per mL    Antibiotic                 RSLT#1    RSLT#2    RSLT#3    RSLT#4 Amoxicillin/Clavulanic Acid    S Ampicillin                     R Cefepime  S Ceftriaxone                    S Cefuroxime                     S Ciprofloxacin                  S Ertapenem                       S Gentamicin                     S Imipenem                       S Levofloxacin                   S Meropenem                      S Nitrofurantoin                 S Piperacillin/Tazobactam        S Tetracycline                   S Tobramycin                     S Trimethoprim/Sulfa             S      _______________________________________________ Hospitalist was called for admission for lumbar vertebral fractures and back pain  The following Work up has been ordered so far:  Orders Placed This Encounter  Procedures   DG Hip Unilat W or Wo Pelvis 2-3 Views Right   DG Shoulder Left   CT Head Wo Contrast   CT Cervical Spine Wo Contrast   CT L-SPINE NO CHARGE   CT T-SPINE NO CHARGE   CT HIP RIGHT WO CONTRAST   CT CHEST ABDOMEN PELVIS WO CONTRAST   CBC with Differential   Comprehensive metabolic panel   Consult to neurosurgery   Consult to hospitalist     OTHER Significant initial  Findings:  labs showing:    Recent Labs  Lab 02/19/22 1615  NA 142  K 3.8  CO2 25  GLUCOSE 142*  BUN 43*  CREATININE 0.81  CALCIUM 9.3    Cr   stable,   Lab Results  Component Value Date   CREATININE 0.81 02/19/2022   CREATININE 0.80 12/25/2021   CREATININE 0.85 06/20/2021    Recent Labs  Lab 02/19/22 1615  AST 14*  ALT 11  ALKPHOS 37*  BILITOT 0.6  PROT 5.9*  ALBUMIN 3.1*   Lab Results  Component Value Date   CALCIUM 9.3 02/19/2022    Plt: Lab Results  Component Value Date   PLT 193 02/19/2022     Recent Labs  Lab 02/19/22 1615  WBC 8.3  NEUTROABS 4.1  HGB 9.0*  HCT 29.4*  MCV 98.0  PLT 193    HG/HCT  stable,      Component Value Date/Time   HGB 9.0 (L) 02/19/2022 1615   HGB 10.0 (L) 01/21/2022 1154   HGB 13.8 04/21/2013 1112   HCT 29.4 (L) 02/19/2022 1615   HCT 30.9 (L) 01/21/2022 1154   HCT 41.5 04/21/2013 1112   MCV 98.0 02/19/2022 1615   MCV 94 01/21/2022 1154   MCV 100.7 04/21/2013 1112  DM  labs:  HbA1C: Recent Labs     06/20/21 1457 12/25/21 1110  HGBA1C 5.3 5.1       CBG (last 3)  No results for input(s): GLUCAP in the last 72 hours.        Cultures:    Component Value Date/Time   SDES URINE, RANDOM 07/09/2016 1035   SPECREQUEST NONE 07/09/2016 1035   CULT >=100,000 COLONIES/mL ESCHERICHIA COLI (A) 07/09/2016 1035   REPTSTATUS 07/12/2016 FINAL 07/09/2016 1035     Radiological Exams on Admission: CT Head Wo Contrast  Result Date: 02/19/2022 CLINICAL DATA:  Head trauma, intracranial arterial injury suspected; Neck trauma (Age >= 65y) EXAM: CT HEAD WITHOUT CONTRAST CT CERVICAL SPINE WITHOUT CONTRAST TECHNIQUE: Multidetector CT imaging of the head and cervical spine was performed following the standard protocol without intravenous contrast. Multiplanar CT image reconstructions of the cervical spine were also generated. RADIATION DOSE REDUCTION: This exam was performed according to the departmental dose-optimization program which includes automated exposure control, adjustment of the mA and/or kV according to patient size and/or use of iterative reconstruction technique. COMPARISON:  None Available. FINDINGS: CT HEAD FINDINGS Brain: No evidence of acute infarction, hemorrhage, hydrocephalus, or extra-axial collection. Approximately 9 mm area of calcification along the inner table of the calvarium along the lower right frontal convexity. No significant mass effect or adjacent edema. Patchy white matter hypodensities, nonspecific but compatible with chronic microvascular ischemic disease. Vascular: No hyperdense vessel identified. Calcific intracranial atherosclerosis. Skull: No evidence of acute fracture. Sinuses/Orbits: Visualized sinuses are clear. No acute orbital findings. Other: No mastoid effusions. CT CERVICAL SPINE FINDINGS Alignment: Mild anterolisthesis of C3 on C4, C4 on C5 and T1 on T2, favored degenerative in etiology given facet arthropathy at these levels. Otherwise, no substantial sagittal  subluxation. Skull base and vertebrae: Vertebral body heights are maintained. No evidence of acute fracture. Soft tissues and spinal canal: No prevertebral fluid or swelling. No visible canal hematoma. Disc levels: Multilevel degenerative disc disease, greatest at C4-C5. Multilevel facet and uncovertebral hypertrophy with varying degrees of neural foraminal stenosis Upper chest: Visualized lung apices are clear. IMPRESSION: CT head: 1. No evidence of acute intracranial abnormality. 2. Chronic microvascular ischemic disease. 3. Approximately 9 mm area of calcification along the lower right frontal convexity, possibly a calcified meningioma versus dural calcification. No significant mass effect or adjacent edema. CT cervical spine: 1. No evidence of acute fracture or traumatic malalignment. 2. Multilevel degenerative change, detailed above. Electronically Signed   By: Margaretha Sheffield M.D.   On: 02/19/2022 16:52   CT Cervical Spine Wo Contrast  Result Date: 02/19/2022 CLINICAL DATA:  Head trauma, intracranial arterial injury suspected; Neck trauma (Age >= 65y) EXAM: CT HEAD WITHOUT CONTRAST CT CERVICAL SPINE WITHOUT CONTRAST TECHNIQUE: Multidetector CT imaging of the head and cervical spine was performed following the standard protocol without intravenous contrast. Multiplanar CT image reconstructions of the cervical spine were also generated. RADIATION DOSE REDUCTION: This exam was performed according to the departmental dose-optimization program which includes automated exposure control, adjustment of the mA and/or kV according to patient size and/or use of iterative reconstruction technique. COMPARISON:  None Available. FINDINGS: CT HEAD FINDINGS Brain: No evidence of acute infarction, hemorrhage, hydrocephalus, or extra-axial collection. Approximately 9 mm area of calcification along the inner table of the calvarium along the lower right frontal convexity. No significant mass effect or adjacent edema. Patchy  white matter hypodensities, nonspecific but compatible with chronic microvascular ischemic disease. Vascular: No hyperdense vessel identified. Calcific intracranial atherosclerosis.  Skull: No evidence of acute fracture. Sinuses/Orbits: Visualized sinuses are clear. No acute orbital findings. Other: No mastoid effusions. CT CERVICAL SPINE FINDINGS Alignment: Mild anterolisthesis of C3 on C4, C4 on C5 and T1 on T2, favored degenerative in etiology given facet arthropathy at these levels. Otherwise, no substantial sagittal subluxation. Skull base and vertebrae: Vertebral body heights are maintained. No evidence of acute fracture. Soft tissues and spinal canal: No prevertebral fluid or swelling. No visible canal hematoma. Disc levels: Multilevel degenerative disc disease, greatest at C4-C5. Multilevel facet and uncovertebral hypertrophy with varying degrees of neural foraminal stenosis Upper chest: Visualized lung apices are clear. IMPRESSION: CT head: 1. No evidence of acute intracranial abnormality. 2. Chronic microvascular ischemic disease. 3. Approximately 9 mm area of calcification along the lower right frontal convexity, possibly a calcified meningioma versus dural calcification. No significant mass effect or adjacent edema. CT cervical spine: 1. No evidence of acute fracture or traumatic malalignment. 2. Multilevel degenerative change, detailed above. Electronically Signed   By: Margaretha Sheffield M.D.   On: 02/19/2022 16:52   CT HIP RIGHT WO CONTRAST  Result Date: 02/19/2022 CLINICAL DATA:  Right hip pain after fall EXAM: CT OF THE RIGHT HIP WITHOUT CONTRAST TECHNIQUE: Multidetector CT imaging of the right hip was performed according to the standard protocol. Multiplanar CT image reconstructions were also generated. RADIATION DOSE REDUCTION: This exam was performed according to the departmental dose-optimization program which includes automated exposure control, adjustment of the mA and/or kV according to  patient size and/or use of iterative reconstruction technique. COMPARISON:  X-ray 02/19/2022 FINDINGS: Bones/Joint/Cartilage No acute fracture. No dislocation. Severe, end-stage degenerative changes of the right hip joint with complete joint space loss, subchondral sclerosis/cystic change, and bulky marginal osteophyte formation. There is axial migration of the humeral head with remodeling of the medial wall of the acetabulum. No large hip joint effusion. Severe osteoarthritis of the pubic symphysis. The pubic symphysis and right SI joint are intact without diastasis. No suspicious lytic or sclerotic bone lesion. Ligaments Suboptimally assessed by CT. Muscles and Tendons No acute musculotendinous abnormality by CT. Generalized muscle atrophy. Soft tissues Mild induration within the subcutaneous fat overlying the hip. No hematoma. No right inguinal lymphadenopathy. Fat containing lower ventral abdominal wall hernia. Diverticular changes of the visualized sigmoid colon. IMPRESSION: 1. No acute fracture or dislocation of the right hip. 2. Severe, end-stage degenerative changes of the right hip joint. Electronically Signed   By: Davina Poke D.O.   On: 02/19/2022 16:58   CT T-SPINE NO CHARGE  Result Date: 02/19/2022 CLINICAL DATA:  Trauma EXAM: CT THORACIC AND LUMBAR SPINE WITHOUT CONTRAST TECHNIQUE: Multidetector CT imaging of the thoracic and lumbar spine was performed without contrast. Multiplanar CT image reconstructions were also generated. RADIATION DOSE REDUCTION: This exam was performed according to the departmental dose-optimization program which includes automated exposure control, adjustment of the mA and/or kV according to patient size and/or use of iterative reconstruction technique. COMPARISON:  None Available. FINDINGS: CT THORACIC SPINE FINDINGS Alignment: There is degenerative anterolisthesis at T1-T2 and T2-T3. Vertebrae: There is no acute thoracic spine fracture. There is no aggressive osseous  lesion. There is a nondisplaced left posterior twelfth rib fracture (series 4, image 138). Paraspinal and other soft tissues: Reported separately on CT chest. Disc levels: There is mild-to-moderate multilevel degenerative disc disease and facet arthropathy. No visible impingement by CT. CT LUMBAR SPINE FINDINGS Segmentation: There are 5 non-rib-bearing lumbar vertebrae. Alignment: Levoconvex lumbar curvature. There is grade 1 anterolisthesis at L3-L4.  Vertebrae: Nondisplaced left L1 transverse process fracture. There is a displaced left L2 transverse process fracture. No acute vertebral body compression fracture. There is no aggressive osseous lesion. Pseudoarticulation of the L5 transverse processes with the sacrum. Paraspinal and other soft tissues: Reported separately on CT abdomen and pelvis. Left renal atrophy. Prominence of the left renal collecting system. Nonobstructive right renal stones. Disc levels: Moderate severe multilevel degenerative disc disease and severe multilevel facet arthropathy. Varying degrees of spinal canal and neural foraminal stenosis. Probable moderate to severe spinal canal stenosis at L3-L4 and L4-L5. Severe left neural foraminal stenosis at L4-L5. IMPRESSION: CT THORACIC SPINE IMPRESSION Nondisplaced left posterior twelfth rib fracture. No acute thoracic spine fracture. CT LUMBAR SPINE IMPRESSION Nondisplaced left L1 and displaced left L2 transverse process fractures. No vertebral body fracture. Levoconvex lumbar curvature. Moderate to severe multilevel degenerative disc disease with varying degrees of spinal canal neural foraminal stenosis. Probable moderate-severe spinal canal stenosis at L3-L4 and L4-L5. Severe left neural foraminal stenosis at L4-L5. Electronically Signed   By: Maurine Simmering M.D.   On: 02/19/2022 16:59   CT L-SPINE NO CHARGE  Result Date: 02/19/2022 CLINICAL DATA:  Trauma EXAM: CT THORACIC AND LUMBAR SPINE WITHOUT CONTRAST TECHNIQUE: Multidetector CT imaging of  the thoracic and lumbar spine was performed without contrast. Multiplanar CT image reconstructions were also generated. RADIATION DOSE REDUCTION: This exam was performed according to the departmental dose-optimization program which includes automated exposure control, adjustment of the mA and/or kV according to patient size and/or use of iterative reconstruction technique. COMPARISON:  None Available. FINDINGS: CT THORACIC SPINE FINDINGS Alignment: There is degenerative anterolisthesis at T1-T2 and T2-T3. Vertebrae: There is no acute thoracic spine fracture. There is no aggressive osseous lesion. There is a nondisplaced left posterior twelfth rib fracture (series 4, image 138). Paraspinal and other soft tissues: Reported separately on CT chest. Disc levels: There is mild-to-moderate multilevel degenerative disc disease and facet arthropathy. No visible impingement by CT. CT LUMBAR SPINE FINDINGS Segmentation: There are 5 non-rib-bearing lumbar vertebrae. Alignment: Levoconvex lumbar curvature. There is grade 1 anterolisthesis at L3-L4. Vertebrae: Nondisplaced left L1 transverse process fracture. There is a displaced left L2 transverse process fracture. No acute vertebral body compression fracture. There is no aggressive osseous lesion. Pseudoarticulation of the L5 transverse processes with the sacrum. Paraspinal and other soft tissues: Reported separately on CT abdomen and pelvis. Left renal atrophy. Prominence of the left renal collecting system. Nonobstructive right renal stones. Disc levels: Moderate severe multilevel degenerative disc disease and severe multilevel facet arthropathy. Varying degrees of spinal canal and neural foraminal stenosis. Probable moderate to severe spinal canal stenosis at L3-L4 and L4-L5. Severe left neural foraminal stenosis at L4-L5. IMPRESSION: CT THORACIC SPINE IMPRESSION Nondisplaced left posterior twelfth rib fracture. No acute thoracic spine fracture. CT LUMBAR SPINE IMPRESSION  Nondisplaced left L1 and displaced left L2 transverse process fractures. No vertebral body fracture. Levoconvex lumbar curvature. Moderate to severe multilevel degenerative disc disease with varying degrees of spinal canal neural foraminal stenosis. Probable moderate-severe spinal canal stenosis at L3-L4 and L4-L5. Severe left neural foraminal stenosis at L4-L5. Electronically Signed   By: Maurine Simmering M.D.   On: 02/19/2022 16:59   DG Shoulder Left  Result Date: 02/19/2022 CLINICAL DATA:  Status post fall, shoulder pain EXAM: LEFT SHOULDER - 2+ VIEW COMPARISON:  None Available. FINDINGS: No acute fracture or dislocation. No aggressive osseous lesion. Normal alignment. Mild arthropathy of the acromioclavicular joint. Amorphous calcification adjacent to the greater tuberosity likely reflecting mild  calcific tendinosis. Soft tissue are unremarkable. No radiopaque foreign body or soft tissue emphysema. IMPRESSION: 1. No acute osseous injury of the left shoulder. Electronically Signed   By: Kathreen Devoid M.D.   On: 02/19/2022 15:30   DG Hip Unilat W or Wo Pelvis 2-3 Views Right  Result Date: 02/19/2022 CLINICAL DATA:  Fall.  RIGHT hip pain EXAM: DG HIP (WITH OR WITHOUT PELVIS) 2-3V RIGHT COMPARISON:  07/02/2021 FINDINGS: Hips are located. There is severe osteophytosis of the RIGHT femoral neck similar to comparison exam. There is loss of joint space and sclerosis. No evidence of traumatic fracture. No pelvic fracture or sacral fracture. IMPRESSION: 1. Severe arthropathy of the RIGHT hip joint similar comparison exam. 2. No radiographic evidence of fracture. RIGHT femoral neck partially obscured by osteophytes. If there is a high clinical suspicion of hip fracture consider CT. Electronically Signed   By: Suzy Bouchard M.D.   On: 02/19/2022 15:35   CT CHEST ABDOMEN PELVIS WO CONTRAST  Result Date: 02/19/2022 CLINICAL DATA:  Status post fall, blunt abdominal trauma EXAM: CT CHEST, ABDOMEN AND PELVIS WITHOUT  CONTRAST TECHNIQUE: Multidetector CT imaging of the chest, abdomen and pelvis was performed following the standard protocol without IV contrast. RADIATION DOSE REDUCTION: This exam was performed according to the departmental dose-optimization program which includes automated exposure control, adjustment of the mA and/or kV according to patient size and/or use of iterative reconstruction technique. COMPARISON:  None Available. FINDINGS: CT CHEST FINDINGS Cardiovascular: No significant vascular findings. Normal heart size. No pericardial effusion. Thoracic aortic atherosclerosis. Coronary artery atherosclerosis. Mediastinum/Nodes: No enlarged mediastinal, hilar, or axillary lymph nodes. Thyroid gland, trachea, and esophagus demonstrate no significant findings. Lungs/Pleura: No focal consolidation, pleural effusion or pneumothorax. Mild right apical scarring. Reticulonodular interstitial disease in the periphery of the right middle lobe as can be seen with an infectious or inflammatory etiology including MAI. Mild lingular scarring versus atelectasis. Musculoskeletal: No acute osseous abnormality. Generalized osteopenia. CT ABDOMEN PELVIS FINDINGS Lower chest: No acute abnormality. Hepatobiliary: No focal liver abnormality is seen. No gallstones, gallbladder wall thickening, or biliary dilatation. Pancreas: Unremarkable. No pancreatic ductal dilatation or surrounding inflammatory changes. Spleen: Normal in size without focal abnormality. Adrenals/Urinary Tract: Adrenal glands are unremarkable. Multiple nonobstructing right renal calculi with the largest measuring 9 mm. 4 cm right renal cyst for which no follow-up imaging is recommended. Atrophic left kidney. Prominent left extrarenal pelvis. Normal bladder. Stomach/Bowel: Stomach is within normal limits. No evidence of bowel wall thickening, distention, or inflammatory changes. Diverticulosis without evidence of diverticulitis. Vascular/Lymphatic: Aortic  atherosclerosis. No enlarged abdominal or pelvic lymph nodes. Reproductive: Status post hysterectomy. No adnexal masses. Other: Fat containing right inguinal hernia. Fat containing umbilical hernia. Musculoskeletal: No acute osseous abnormality. No aggressive osseous lesion. Severe osteoarthritis of the right hip. Mild osteoarthritis of the left hip. Diffuse lumbar spine spondylosis. IMPRESSION: 1. No acute traumatic injury to the chest, abdomen or pelvis. 2. Reticulonodular interstitial disease in the periphery of the right middle lobe as can be seen with an infectious or inflammatory etiology including MAI. 3. Diverticulosis without evidence of diverticulitis. 4. Multiple nonobstructing right renal calculi with the largest measuring 9 mm. 5. Severe osteoarthritis of the right hip. 6. Aortic Atherosclerosis (ICD10-I70.0). Electronically Signed   By: Kathreen Devoid M.D.   On: 02/19/2022 16:56   _______________________________________________________________________________________________________ Latest  Blood pressure (!) 139/114, pulse 100, temperature 97.6 F (36.4 C), temperature source Oral, resp. rate 18, SpO2 99 %.   Vitals  labs and radiology finding personally  reviewed  Review of Systems:    Pertinent positives include: falls back pain.  Constitutional:  No weight loss, night sweats, Fevers, chills, fatigue, weight loss  HEENT:  No headaches, Difficulty swallowing,Tooth/dental problems,Sore throat,  No sneezing, itching, ear ache, nasal congestion, post nasal drip,  Cardio-vascular:  No chest pain, Orthopnea, PND, anasarca, dizziness, palpitations.no Bilateral lower extremity swelling  GI:  No heartburn, indigestion, abdominal pain, nausea, vomiting, diarrhea, change in bowel habits, loss of appetite, melena, blood in stool, hematemesis Resp:  no shortness of breath at rest. No dyspnea on exertion, No excess mucus, no productive cough, No non-productive cough, No coughing up of blood.No  change in color of mucus.No wheezing. Skin:  no rash or lesions. No jaundice GU:  no dysuria, change in color of urine, no urgency or frequency. No straining to urinate.  No flank pain.  Musculoskeletal:  No joint pain or no joint swelling. No decreased range of motion. No  Psych:  No change in mood or affect. No depression or anxiety. No memory loss.  Neuro: no localizing neurological complaints, no tingling, no weakness, no double vision, no gait abnormality, no slurred speech, no confusion  All systems reviewed and apart from [place] all are negative _______________________________________________________________________________________________ Past Medical History:   Past Medical History:  Diagnosis Date   Abnormality of gait 07/21/2016   Arthritis    right knee, left wrist   Asthma    related to allergies when working; resolved   Breast cancer ([place]) 7/08 Inrasine ductal L breast   Colon polyps    4/09--adenomatous. 04/2010--normal.  Repeat 2014   DDD (degenerative disc disease), lumbar 4/06   Diabetes mellitus    diet controlled   Diabetic retinopathy    DJD (degenerative joint disease)    Dyslipidemia    Endometrial cancer (HCC) hx   Gout    Hepatitis A history(in [place]-1950's)   History of stroke 10/24/2013   Hypertension    Incontinence    Membranous glomerulonephropathy    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   Sliding hiatal hernia small sliding hiatal hernia   Wears glasses       Past Surgical History:  Procedure Laterality Date   ABDOMINAL HYSTERECTOMY     endometrial cancer   APPENDECTOMY     BREAST LUMPECTOMY Left 8/08 L Breast ([name])   CATARACT EXTRACTION     COLONOSCOPY  8/08 [name] (normal)   kidney stones     left middle finger     REPLACEMENT TOTAL KNEE  Left   SKIN CANCER EXCISION     TOTAL KNEE ARTHROPLASTY      left    Social History:  Ambulatory   independently      reports that she has never smoked. She has never used smokeless tobacco. She reports that she does not drink alcohol and does not use drugs.   Family History:   Family History  Problem Relation Age of Onset   Heart attack Father    Diabetes Father    Heart attack Mother    Hypertension Mother    Diabetes Mother    Hypertension Brother    Stroke Brother    Melanoma Brother    Diabetes Brother    Hypertension Son    Alcohol abuse Son    Stroke Paternal Grandmother  Diabetes Paternal Grandfather    Vision loss Paternal Grandfather        related to diabetes   Cancer Neg Hx    ______________________________________________________________________________________________ Allergies: Allergies  Allergen Reactions   Ciprofloxacin Nausea And Vomiting   Codeine Nausea And Vomiting   Contrast Media [Iodinated Contrast Media] Nausea And Vomiting   Morphine And Related Nausea And Vomiting   Statins Other (See Comments)    lethargic   Toprol Xl [Metoprolol Tartrate]     Symptoms of heart attack, per pt, told not to take it again (?chest pain)   Clindamycin/Lincomycin Itching and Rash   Penicillins Hives, Swelling and Rash    Has patient had a PCN reaction causing immediate rash, facial/tongue/throat swelling, SOB or lightheadedness with hypotension: no Has patient had a PCN reaction causing severe rash ivolving mucus membranes or skin necrosis: no Has patient had a PCN reaction that required hospitalization no Has patient had a PCN reaction occurring within the last 10 years: no If all of the above answers are "NO", then may proceed with Cephalosporin use.   Prednisone Palpitations     Prior to Admission medications   Medication Sig Start Date End Date Taking? Authorizing Provider  Acetaminophen (TYLENOL ARTHRITIS PAIN PO) Take 2 tablets by mouth 2 (two) times daily.    [provider]  Apoaequorin  (PREVAGEN PO) Take 1 tablet by mouth daily.    [provider]  Ascorbic Acid (VITAMIN C) 500 MG CAPS Take 1 tablet by mouth daily.    [provider]  Blood Glucose Monitoring Suppl (ACCU-CHEK GUIDE) w/Device KIT 1 each by Does not apply route 2 (two) times daily. Patient not taking: Reported on 12/25/2021 07/02/17   Rita Ohara, MD  Calcium Carbonate-Vitamin D (CALTRATE 600+D PO) Take 2 tablets by mouth daily.     [provider]  carvedilol (COREG) 25 MG tablet TAKE 1 AND 1/2 TABLETS BY MOUTH TWICE DAILY 01/20/22   Rita Ohara, MD  cephALEXin (KEFLEX) 500 MG capsule Take 1 tablet prior to dental procedure, take second tablet evening after dental procedure Patient not taking: Reported on 12/25/2021 08/06/15   Rita Ohara, MD  cholecalciferol (VITAMIN D) 1000 units tablet Take 1,000 Units by mouth daily.    [provider]  clopidogrel (PLAVIX) 75 MG tablet TAKE 1 TABLET BY MOUTH EVERY DAY 11/18/21   Rita Ohara, MD  Coenzyme Q10 (CO Q 10 PO) Take 1 tablet by mouth daily.     [provider]  ezetimibe (ZETIA) 10 MG tablet TAKE 1 TABLET BY MOUTH EVERY DAY 11/05/21   Rita Ohara, MD  FREESTYLE LITE test strip TEST BLOOD SUGAR TWICE DAILY Patient not taking: Reported on 12/25/2021 04/01/19   Rita Ohara, MD  gabapentin (NEURONTIN) 300 MG capsule TAKE 1 CAPSULE BY MOUTH EVERY DAY AT BEDTIME 09/12/21   Rita Ohara, MD  hydrochlorothiazide (HYDRODIURIL) 12.5 MG tablet TAKE 1 TABLET(12.5 MG) BY MOUTH DAILY 12/16/21   Rita Ohara, MD  KLOR-CON M10 10 MEQ tablet TAKE 1 TABLET BY MOUTH EVERY DAY 12/16/21   Rita Ohara, MD  Lancets Micro Thin 33G MISC 1 each by Does not apply route 2 (two) times daily. Patient not taking: Reported on 12/25/2021 11/14/19   Rita Ohara, MD  losartan (COZAAR) 100 MG tablet TAKE 1 TABLET BY MOUTH EVERY DAY 08/07/21   Rita Ohara, MD  Misc Natural Products (OSTEO BI-FLEX ADV TRIPLE ST PO) Take 2 capsules by mouth daily.    [provider]  Multiple  Vitamin (MULTIVITAMIN) tablet Take 1 tablet by mouth daily.    [provider]  nitrofurantoin, macrocrystal-monohydrate, (MACROBID) 100 MG capsule Take 1 capsule (100 mg total) by mouth 2 (two) times daily. Patient not taking: Reported on 02/11/2022 12/30/21   Rita Ohara, MD  Omega-3 Fatty Acids (FISH OIL) 1000 MG CAPS Take 1 capsule by mouth 2 (two) times daily.    [provider]  solifenacin (VESICARE) 5 MG tablet Take 5 mg by mouth daily. 05/07/19   [provider]  Specialty Vitamins Products ([employer] HAIR/SKIN/NAILS PO) Take 2 each by mouth daily.    [provider]  triamcinolone cream (KENALOG) 0.1 % Apply 1 application. topically 2 (two) times daily. 01/09/22   Rita Ohara, MD  TURMERIC PO Take 1 tablet by mouth daily.    [provider]  Zinc 50 MG CAPS Take 1 capsule by mouth daily. Patient not taking: Reported on 12/25/2021    [provider]  potassium chloride (K-DUR) 10 MEQ tablet TAKE 1 TABLET BY MOUTH EVERY DAY 07/27/13 01/23/14  Rita Ohara, MD    ___________________________________________________________________________________________________ Physical Exam:    02/19/2022    6:45 PM 02/19/2022    4:51 PM 02/19/2022    2:30 PM  Vitals with BMI  Systolic 528 413 244  Diastolic 010 77 54  Pulse 100 99 82     1. General:  in No  Acute distress   Chronically ill   -appearing 2. Psychological: Alert and   Oriented 3. Head/ENT:   Dry Mucous Membranes                          Head Non traumatic, neck supple                          Poor Dentition 4. SKIN:  decreased Skin turgor,  Skin clean Dry and intact no rash 5. Heart: Regular rate and rhythm no  Murmur, no Rub or gallop 6. Lungs:  no wheezes or crackles   7. Abdomen: Soft,  non-tender, Non distended bowel sounds present 8. Lower extremities: no clubbing, cyanosis,  edema Right > left 9. Neurologically Grossly intact, moving all 4 extremities equally   10. MSK: Normal range  of motion limited in right hip    Chart has been reviewed  ______________________________________________________________________________________________  Assessment/Plan  88 y.o. female with medical history significant of DVT on Xarelto, anemia, DM2, HTN history of breast cancer, CVA, history of endometrial cancer  Admitted for lumbar vertebral fractures resulting in back pain  Present on Admission:  Vertebral fracture, osteoporotic, initial encounter ([place])  Pure hypercholesterolemia  DM (diabetes mellitus), type 2 with peripheral vascular complications ([place])  Essential hypertension, benign  Primary osteoarthritis of right hip  DVT (deep venous thrombosis) (HCC)  Anemia     History of stroke Continue to hold off Plavix patient is at risk of bleeding  has known history of anemia  Vertebral fracture, osteoporotic, initial encounter ([place]) PT OT assessment pain management.  Patient does not wish to receive IV medications such as morphine because she is allergic could try for as needed fentanyl if needed Otherwise patient has been taking scheduled Tylenol will continue for now also will try p.o. Ultram  Pure hypercholesterolemia Chronic stable continue Zetia  DM (diabetes mellitus), type 2 with peripheral vascular complications (HCC) Chronic not taking medications order sliding scale  Essential hypertension, benign Continue Coreg for now  Primary osteoarthritis  of right hip Chronic resulting in severe pain.  Manage pain control is much as able PT OT assessment may need rehab secondary to recent fall  DVT (deep venous thrombosis) (HCC) Known right lower extremity DVT.  Continue Xarelto.  Patient to have outpatient follow-up and work-up for possible recurrent cancer given unprovoked DVT  Fall at home, initial encounter PT OT assessment prior to discharge  Anemia Obtain anemia panel Hemoccult stool patient denies any blood in stool or melena.  Status post hysterectomy    Other plan as per orders.  DVT prophylaxis: Xarelto   Code Status:    Code Status: Prior FULL CODE as per patient   I had personally discussed CODE STATUS with patient    Family Communication:   Family  at  Bedside  plan of care was discussed   with   Daughter,   Disposition Plan:        To home once workup is complete and patient is stable   Following barriers for discharge:                                                         Anemia stable                             Pain controlled with PO medications                                                 Would benefit from PT/OT eval prior to DC  Ordered                    Consults called: NS was made aware, re consult as needed   Admission status:  ED Disposition     ED Disposition  Admit   Condition  --   [place]: [place] [[id number]]  Level of Care: Telemetry [5]  Admit to tele based on following criteria: Other see comments  Comments: iv pain meds given  May place patient in observation at [facility] or [facility] if equivalent level of care is available:: No  Covid Evaluation: Asymptomatic - no recent exposure (last 10 days) testing not required  Diagnosis: Vertebral fracture, osteoporotic, initial encounter [facility]) [[id number]]  Admitting Physician: Toy Baker [[id number]]  Attending Physician: Toy Baker [[id number]]           Obs    Level of care     tele  For 12H        Vermelle Cammarata Sims 02/19/2022, 9:01 PM    [facility]     after 2 AM please page floor coverage PA If 7AM-7PM, please contact the day team taking care of the patient using [email or web address]   Patient was evaluated in the context of the global COVID-19 pandemic, which necessitated consideration that the patient might be at risk for infection with the SARS-CoV-2 virus that causes COVID-19. Institutional protocols and algorithms that pertain to the evaluation of patients at risk for COVID-19 are in a  state of rapid change based on information released by regulatory bodies including the CDC and federal and state organizations. These  policies and algorithms were followed during the patient's care.

## 2022-02-19 NOTE — ED Triage Notes (Signed)
BIB PTAR  Per PTAR: Pt coming from home w/ c/o a fall yesterday. Denies hitting head, Denies LOC.  Pt takes xarelto. Today, pt pain in right hip & left shoulder. No deformity noted.  Hx HTN Hx blood clots  VSS  118/66 92% RA 80HR

## 2022-02-19 NOTE — Assessment & Plan Note (Signed)
Chronic resulting in severe pain.  Manage pain control is much as able PT OT assessment may need rehab secondary to recent fall

## 2022-02-19 NOTE — Assessment & Plan Note (Signed)
Continue Coreg for now

## 2022-02-20 DIAGNOSIS — R262 Difficulty in walking, not elsewhere classified: Secondary | ICD-10-CM

## 2022-02-20 DIAGNOSIS — E11319 Type 2 diabetes mellitus with unspecified diabetic retinopathy without macular edema: Secondary | ICD-10-CM | POA: Diagnosis present

## 2022-02-20 DIAGNOSIS — M8008XA Age-related osteoporosis with current pathological fracture, vertebra(e), initial encounter for fracture: Secondary | ICD-10-CM | POA: Diagnosis present

## 2022-02-20 DIAGNOSIS — Z853 Personal history of malignant neoplasm of breast: Secondary | ICD-10-CM | POA: Diagnosis not present

## 2022-02-20 DIAGNOSIS — D649 Anemia, unspecified: Secondary | ICD-10-CM | POA: Diagnosis present

## 2022-02-20 DIAGNOSIS — M109 Gout, unspecified: Secondary | ICD-10-CM | POA: Diagnosis present

## 2022-02-20 DIAGNOSIS — Z8249 Family history of ischemic heart disease and other diseases of the circulatory system: Secondary | ICD-10-CM | POA: Diagnosis not present

## 2022-02-20 DIAGNOSIS — Z8673 Personal history of transient ischemic attack (TIA), and cerebral infarction without residual deficits: Secondary | ICD-10-CM | POA: Diagnosis not present

## 2022-02-20 DIAGNOSIS — M1611 Unilateral primary osteoarthritis, right hip: Secondary | ICD-10-CM | POA: Diagnosis present

## 2022-02-20 DIAGNOSIS — L97919 Non-pressure chronic ulcer of unspecified part of right lower leg with unspecified severity: Secondary | ICD-10-CM | POA: Diagnosis present

## 2022-02-20 DIAGNOSIS — S2232XA Fracture of one rib, left side, initial encounter for closed fracture: Secondary | ICD-10-CM | POA: Diagnosis present

## 2022-02-20 DIAGNOSIS — Z888 Allergy status to other drugs, medicaments and biological substances status: Secondary | ICD-10-CM | POA: Diagnosis not present

## 2022-02-20 DIAGNOSIS — I10 Essential (primary) hypertension: Secondary | ICD-10-CM | POA: Diagnosis present

## 2022-02-20 DIAGNOSIS — Z885 Allergy status to narcotic agent status: Secondary | ICD-10-CM | POA: Diagnosis not present

## 2022-02-20 DIAGNOSIS — M5136 Other intervertebral disc degeneration, lumbar region: Secondary | ICD-10-CM | POA: Diagnosis present

## 2022-02-20 DIAGNOSIS — Y92009 Unspecified place in unspecified non-institutional (private) residence as the place of occurrence of the external cause: Secondary | ICD-10-CM | POA: Diagnosis not present

## 2022-02-20 DIAGNOSIS — Z96652 Presence of left artificial knee joint: Secondary | ICD-10-CM | POA: Diagnosis present

## 2022-02-20 DIAGNOSIS — Z880 Allergy status to penicillin: Secondary | ICD-10-CM | POA: Diagnosis not present

## 2022-02-20 DIAGNOSIS — E7800 Pure hypercholesterolemia, unspecified: Secondary | ICD-10-CM | POA: Diagnosis present

## 2022-02-20 DIAGNOSIS — I824Y1 Acute embolism and thrombosis of unspecified deep veins of right proximal lower extremity: Secondary | ICD-10-CM

## 2022-02-20 DIAGNOSIS — E1151 Type 2 diabetes mellitus with diabetic peripheral angiopathy without gangrene: Secondary | ICD-10-CM | POA: Diagnosis present

## 2022-02-20 DIAGNOSIS — Z85828 Personal history of other malignant neoplasm of skin: Secondary | ICD-10-CM | POA: Diagnosis not present

## 2022-02-20 DIAGNOSIS — E860 Dehydration: Secondary | ICD-10-CM | POA: Diagnosis present

## 2022-02-20 DIAGNOSIS — M48061 Spinal stenosis, lumbar region without neurogenic claudication: Secondary | ICD-10-CM | POA: Diagnosis present

## 2022-02-20 DIAGNOSIS — I82511 Chronic embolism and thrombosis of right femoral vein: Secondary | ICD-10-CM | POA: Diagnosis present

## 2022-02-20 DIAGNOSIS — Z881 Allergy status to other antibiotic agents status: Secondary | ICD-10-CM | POA: Diagnosis not present

## 2022-02-20 DIAGNOSIS — W19XXXA Unspecified fall, initial encounter: Secondary | ICD-10-CM | POA: Diagnosis present

## 2022-02-20 DIAGNOSIS — Z8542 Personal history of malignant neoplasm of other parts of uterus: Secondary | ICD-10-CM | POA: Diagnosis not present

## 2022-02-20 LAB — URINALYSIS, COMPLETE (UACMP) WITH MICROSCOPIC
Bilirubin Urine: NEGATIVE
Glucose, UA: NEGATIVE mg/dL
Ketones, ur: NEGATIVE mg/dL
Leukocytes,Ua: NEGATIVE
Nitrite: NEGATIVE
Protein, ur: NEGATIVE mg/dL
Specific Gravity, Urine: 1.013 (ref 1.005–1.030)
pH: 5 (ref 5.0–8.0)

## 2022-02-20 LAB — COMPREHENSIVE METABOLIC PANEL
ALT: 10 U/L (ref 0–44)
AST: 13 U/L — ABNORMAL LOW (ref 15–41)
Albumin: 2.5 g/dL — ABNORMAL LOW (ref 3.5–5.0)
Alkaline Phosphatase: 29 U/L — ABNORMAL LOW (ref 38–126)
Anion gap: 5 (ref 5–15)
BUN: 39 mg/dL — ABNORMAL HIGH (ref 8–23)
CO2: 27 mmol/L (ref 22–32)
Calcium: 8.7 mg/dL — ABNORMAL LOW (ref 8.9–10.3)
Chloride: 110 mmol/L (ref 98–111)
Creatinine, Ser: 0.73 mg/dL (ref 0.44–1.00)
GFR, Estimated: >60 mL/min (ref >60)
Glucose, Bld: 149 mg/dL — ABNORMAL HIGH (ref 70–99)
Potassium: 4 mmol/L (ref 3.5–5.1)
Sodium: 142 mmol/L (ref 135–145)
Total Bilirubin: 0.4 mg/dL (ref 0.3–1.2)
Total Protein: 5.4 g/dL — ABNORMAL LOW (ref 6.5–8.1)

## 2022-02-20 LAB — TYPE AND SCREEN
ABO/RH(D): AB POS
Antibody Screen: NEGATIVE

## 2022-02-20 LAB — CBC
HCT: 25.9 % — ABNORMAL LOW (ref 36.0–46.0)
HCT: 26.4 % — ABNORMAL LOW (ref 36.0–46.0)
Hemoglobin: 7.9 g/dL — ABNORMAL LOW (ref 12.0–15.0)
Hemoglobin: 8 g/dL — ABNORMAL LOW (ref 12.0–15.0)
MCH: 29.7 pg (ref 26.0–34.0)
MCH: 29.9 pg (ref 26.0–34.0)
MCHC: 30.5 g/dL (ref 30.0–36.0)
MCHC: 30.3 g/dL (ref 30.0–36.0)
MCV: 97.4 fL (ref 80.0–100.0)
MCV: 98.5 fL (ref 80.0–100.0)
Platelets: 171 10*3/uL (ref 150–400)
Platelets: 171 10*3/uL (ref 150–400)
RBC: 2.66 MIL/uL — ABNORMAL LOW (ref 3.87–5.11)
RBC: 2.68 MIL/uL — ABNORMAL LOW (ref 3.87–5.11)
RDW: 15.1 % (ref 11.5–15.5)
RDW: 15.3 % (ref 11.5–15.5)
WBC: 8.4 10*3/uL (ref 4.0–10.5)
WBC: 11.7 10*3/uL — ABNORMAL HIGH (ref 4.0–10.5)
nRBC: 0 % (ref 0.0–0.2)
nRBC: 0 % (ref 0.0–0.2)

## 2022-02-20 LAB — GLUCOSE, CAPILLARY
Glucose-Capillary: 122 mg/dL — ABNORMAL HIGH (ref 70–99)
Glucose-Capillary: 193 mg/dL — ABNORMAL HIGH (ref 70–99)
Glucose-Capillary: 174 mg/dL — ABNORMAL HIGH (ref 70–99)
Glucose-Capillary: 96 mg/dL (ref 70–99)

## 2022-02-20 LAB — IRON AND TIBC
Iron: 36 ug/dL (ref 28–170)
Saturation Ratios: 11 % (ref 10.4–31.8)
TIBC: 316 ug/dL (ref 250–450)
UIBC: 280 ug/dL

## 2022-02-20 LAB — CREATININE, URINE, RANDOM: Creatinine, Urine: 45.5 mg/dL

## 2022-02-20 LAB — RETICULOCYTES
Immature Retic Fract: 30.6 % — ABNORMAL HIGH (ref 2.3–15.9)
RBC.: 2.68 MIL/uL — ABNORMAL LOW (ref 3.87–5.11)
Retic Count, Absolute: 87.6 10*3/uL (ref 19.0–186.0)
Retic Ct Pct: 3.3 % — ABNORMAL HIGH (ref 0.4–3.1)

## 2022-02-20 LAB — PREALBUMIN: Prealbumin: 17.2 mg/dL — ABNORMAL LOW (ref 18–38)

## 2022-02-20 LAB — OSMOLALITY, URINE: Osmolality, Ur: 508 mOsm/kg (ref 300–900)

## 2022-02-20 LAB — FOLATE: Folate: 21.9 ng/mL (ref >5.9)

## 2022-02-20 LAB — FERRITIN: Ferritin: 11 ng/mL (ref 11–307)

## 2022-02-20 LAB — VITAMIN B12: Vitamin B-12: 364 pg/mL (ref 180–914)

## 2022-02-20 MED ORDER — GERHARDT'S BUTT CREAM
TOPICAL_CREAM | CUTANEOUS | Status: DC | PRN
  Filled 2022-02-20: qty 1

## 2022-02-20 MED ORDER — MELATONIN 3 MG PO TABS
3.0000 mg | ORAL_TABLET | Freq: Every day | ORAL | Status: DC
  Administered 2022-02-20: 3 mg via ORAL
  Filled 2022-02-20: qty 1

## 2022-02-20 MED ORDER — DICLOFENAC SODIUM 1 % EX GEL
2.0000 g | Freq: Four times a day (QID) | CUTANEOUS | Status: DC
  Administered 2022-02-20 – 2022-02-21 (×5): 2 g via TOPICAL
  Filled 2022-02-20: qty 100

## 2022-02-20 MED ORDER — SODIUM CHLORIDE 0.9 % IV SOLN: INTRAVENOUS | Status: DC

## 2022-02-20 NOTE — NC FL2 (Signed)
[place] MEDICAID FL2 LEVEL OF CARE SCREENING TOOL     IDENTIFICATION  Patient Name: Kristin Willis Birthdate: 07-May-1929 Sex: female Admission Date (Current Location): 02/19/2022  [facility] and [place] Number:  Herbalist and Address:  [facility],  [address] [place], [place]      Provider Number: [id number]  Attending Physician Name and Address:  Debbe Odea, MD  Relative Name and Phone Number:  daughter, Barbarann Ehlers [phone]    Current Level of Care: Hospital Recommended Level of Care: [place] Prior Approval Number:    Date Approved/Denied:   PASRR Number: [id number]  Discharge Plan: SNF    Current Diagnoses: Patient Active Problem List   Diagnosis Date Noted   Ambulatory dysfunction 02/20/2022   Vertebral fracture, osteoporotic, initial encounter ([place]) 02/19/2022   DVT (deep venous thrombosis) ([place]) 02/19/2022   Fall at home, initial encounter 02/19/2022   Anemia 02/19/2022   Primary osteoarthritis of right shoulder 07/02/2021   Primary osteoarthritis of right hip 07/02/2021   Controlled type 2 diabetes mellitus with mild nonproliferative retinopathy of both eyes, without long-term current use of insulin ([place]) 06/19/2021   Moderate nonproliferative diabetic retinopathy of left eye ([place]) 10/16/2020   Moderate nonproliferative diabetic retinopathy of right eye ([place]) 10/16/2020   Posterior vitreous detachment of both eyes 10/16/2020   Nonproliferative diabetic retinopathy of right eye ([place]) 02/15/2017   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   Abnormality of gait 07/21/2016   Senile purpura ([place]) 08/07/2015   Mixed hyperlipidemia 01/29/2015   Microalbuminuria 01/10/2014   Urge incontinence 01/10/2014   History of stroke 10/24/2013   PAD (peripheral artery disease) ([place]) 07/11/2013   Peripheral neuropathy 01/10/2013   Osteoporosis 06/21/2012   Breast cancer ([place]) 09/01/2011   Gout 08/06/2011    Pure hypercholesterolemia 03/05/2011   DM (diabetes mellitus), type 2 with peripheral vascular complications ([place]) 34/11/7094   Essential hypertension, benign 03/05/2011    Orientation RESPIRATION BLADDER Height & Weight     Self, Time, Situation, Place  Normal Continent (currently with purewick) Weight:   Height:     BEHAVIORAL SYMPTOMS/MOOD NEUROLOGICAL BOWEL NUTRITION STATUS      Continent Diet (Regular)  AMBULATORY STATUS COMMUNICATION OF NEEDS Skin   Extensive Assist Verbally Other (Comment) (MASD to right leg)                       Personal Care Assistance Level of Assistance  Bathing, Dressing Bathing Assistance: Limited assistance   Dressing Assistance: Limited assistance     Functional Limitations Info             [place]  PT (By licensed PT), OT (By licensed OT)     PT Frequency: 5x/wk OT Frequency: 5x/wk            Contractures Contractures Info: Not present    Additional Factors Info  Code Status, Allergies, Insulin Sliding Scale Code Status Info: Full Allergies Info: Ciprofloxacin, Codeine, Contrast Media (Iodinated Contrast Media), Morphine And Related, Statins, Toprol Xl (Metoprolol Tartrate), Clindamycin/lincomycin, Penicillins, Prednisone   Insulin Sliding Scale Info: see MAR       Current Medications (02/20/2022):  This is the current hospital active medication list Current Facility-Administered Medications  Medication Dose Route Frequency Provider Last Rate Last Admin   0.9 %  sodium chloride infusion   Intravenous Continuous Rizwan, Saima, MD       acetaminophen (TYLENOL) tablet 650 mg  650 mg Oral  Q6H PRN Toy Baker, MD   650 mg at 02/20/22 1345   Or   acetaminophen (TYLENOL) suppository 650 mg  650 mg Rectal Q6H PRN Doutova, Anastassia, MD       carvedilol (COREG) tablet 37.5 mg  37.5 mg Oral BID Doutova, Anastassia, MD   37.5 mg at 02/20/22 1000   diclofenac Sodium (VOLTAREN) 1 % topical gel 2 g  2 g  Topical QID Debbe Odea, MD   2 g at 02/20/22 1102   ezetimibe (ZETIA) tablet 10 mg  10 mg Oral Daily Doutova, Anastassia, MD   10 mg at 02/20/22 1000   fentaNYL (SUBLIMAZE) injection 25 mcg  25 mcg Intravenous Q2H PRN Doutova, Anastassia, MD       gabapentin (NEURONTIN) capsule 300 mg  300 mg Oral QHS Doutova, Anastassia, MD   300 mg at 02/19/22 2112   Gerhardt's butt cream   Topical PRN Rizwan, Eunice Blase, MD       insulin aspart (novoLOG) injection 0-9 Units  0-9 Units Subcutaneous TID WC Doutova, Anastassia, MD   2 Units at 02/20/22 1339   oxyCODONE-acetaminophen (PERCOCET/ROXICET) 5-325 MG per tablet 1 tablet  1 tablet Oral Once Toy Baker, MD       Rivaroxaban (XARELTO) tablet 15 mg  15 mg Oral BID Doutova, Anastassia, MD   15 mg at 02/20/22 0959   Followed by   Derrill Memo ON 03/04/2022] rivaroxaban (XARELTO) tablet 20 mg  20 mg Oral Q supper Doutova, Anastassia, MD       traMADol (ULTRAM) tablet 50 mg  50 mg Oral Q6H PRN Toy Baker, MD   50 mg at 02/20/22 1102     Discharge Medications: Please see discharge summary for a list of discharge medications.  Relevant Imaging Results:  Relevant Lab Results:   Additional Information SS# [id number]  Lennart Pall, LCSW

## 2022-02-20 NOTE — Evaluation (Signed)
Clinical/Bedside Swallow Evaluation Patient Details  Name: Kristin Willis MRN: [id number] Date of Birth: 09/07/1929  Today's Date: 02/20/2022 Time: SLP Start Time (ACUTE ONLY): 4401 SLP Stop Time (ACUTE ONLY): 0272 SLP Time Calculation (min) (ACUTE ONLY): 26 min  Past Medical History:  Past Medical History:  Diagnosis Date   Abnormality of gait 07/21/2016   Arthritis    right knee, left wrist   Asthma    related to allergies when working; resolved   Breast cancer ([place]) 7/08 Inrasine ductal L breast   Colon polyps    4/09--adenomatous. 04/2010--normal.  Repeat 2014   DDD (degenerative disc disease), lumbar 4/06   Diabetes mellitus    diet controlled   Diabetic retinopathy    DJD (degenerative joint disease)    Dyslipidemia    Endometrial cancer (HCC) hx   Gout    Hepatitis A history(in [place]-1950's)   History of stroke 10/24/2013   Hypertension    Incontinence    Membranous glomerulonephropathy    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   Sliding hiatal hernia small sliding hiatal hernia   Wears glasses    Past Surgical History:  Past Surgical History:  Procedure Laterality Date   ABDOMINAL HYSTERECTOMY     endometrial cancer   APPENDECTOMY     BREAST LUMPECTOMY Left 8/08 L Breast ([name])   CATARACT EXTRACTION     COLONOSCOPY  8/08 [name] (normal)   kidney stones     left middle finger     REPLACEMENT TOTAL KNEE  Left   SKIN CANCER EXCISION     TOTAL KNEE ARTHROPLASTY     left   HPI:  Pt is a 50 female admitted after being found down at home after a fall. Pt sustained a R bruised hip, 12th rib fx and L1, L2 transverse process fx, she was found to have lumbar vertebral fractures resulting in back pain.  Pt has h/o anemia, DM2, CVA, endometrial cancer, breast cancer, DVT, anemia.  Imaging showed reiculonodular interstitial disease in the  periphery of the right middle lobe as can be infectious or inflammatory etiology including MAI.    Assessment / Plan / Recommendation  Clinical Impression  Pt with normal oropharyngeal swallow ability based on clinical swallow evaluation.  No indication of aspiration with intake of Tylenol with water given by RN,or with 3 ounce Yale water screen. She has h/o Bell's Palsy.  Articulation was [facility] and she desired HOB lowered to approximately 35*, therefore did not see pt with solids requiring mastication.  Pt denies any deficits in swallow.  She does report occasional bad breath - and sees a peridontist every four months. SLP reviewed normal changes with aged swallowing and advised general aspiration precautions currently given her pain with HOB elevation and likely with cough.  Using teach back, educated completed. No SLP follow up needed. Thanks. SLP Visit Diagnosis: Dysphagia, unspecified (R13.10)    Aspiration Risk  Mild aspiration risk    Diet Recommendation Regular;Thin liquid   Liquid Administration via: Cup;Straw Medication Administration: Whole meds with liquid Supervision: Patient able to self feed Compensations: Minimize environmental distractions;Slow rate;Small sips/bites Postural Changes: Seated upright at 90 degrees;Remain upright for at least 30 minutes after po intake    Other  Recommendations Oral Care Recommendations: Oral care BID    Recommendations for follow up therapy  are one component of a multi-disciplinary discharge planning process, led by the attending physician.  Recommendations may be updated based on patient status, additional functional criteria and insurance authorization.  Follow up Recommendations No SLP follow up      Assistance Recommended at Discharge None  Functional Status Assessment Patient has not had a recent decline in their functional status  Frequency and Duration      N/a      Prognosis   N/a     Swallow Study   General Date of Onset:  02/20/22 HPI: Pt is a 46 female admitted after being found down at home after a fall. Pt sustained a R bruised hip, 12th rib fx and L1, L2 transverse process fx, she was found to have lumbar vertebral fractures resulting in back pain.  Pt has h/o anemia, DM2, CVA, endometrial cancer, breast cancer, DVT, anemia.  Imaging showed reiculonodular interstitial disease in the periphery of the right middle lobe as can be infectious or inflammatory etiology including MAI. Type of Study: Bedside Swallow Evaluation Diet Prior to this Study: Regular;Thin liquids Temperature Spikes Noted: No Respiratory Status: Room air History of Recent Intubation: No Behavior/Cognition: Alert;Cooperative;Pleasant mood Oral Cavity Assessment: Within Functional Limits Oral Care Completed by SLP: No Oral Cavity - Dentition: Adequate natural dentition Vision: Functional for self-feeding Self-Feeding Abilities: Able to feed self Patient Positioning: Upright in bed Baseline Vocal Quality: Normal Volitional Cough: Other (Comment) (DNT) Volitional Swallow: Able to elicit    Oral/Motor/Sensory Function Overall Oral Motor/Sensory Function: Within functional limits   Ice Chips Ice chips: Not tested   Thin Liquid Thin Liquid: Within functional limits Presentation: Straw Other Comments: 3 ounce yale easily passed    Nectar Thick Nectar Thick Liquid: Not tested   Honey Thick Honey Thick Liquid: Not tested   Puree Puree: Not tested Other Comments: due to positioning   Solid     Solid: Not tested Other Comments: due to positioning      Macario Golds 02/20/2022,2:33 PM Kathleen Lime, MS [place] Office [phone] Pager [phone]

## 2022-02-20 NOTE — Evaluation (Signed)
Occupational Therapy Evaluation Patient Details Name: Kristin Willis MRN: [id number] DOB: [birth year] Today's Date: 02/20/2022   History of Present Illness Pt is a 25 female admitted after being found down at home after a fall. Pt sustained a R bruised hip, 12th rib fx and L1, L2 transverse process fx.  Pt was initailly seen by paramedics and refused to come to hosptial but agreed to come the next morning when she could no longer get up on her own.  Pt also with L shoulder pain but xray is negative.  Pt with h/o blood clots in RLE and on Xeralto.   Clinical Impression   Pt was admitted with the above diagnosis and has the deficits listed below. Pt would benefit from cont OT to increase independence with basic adls and adl transfers so pt can eventually return home to live alone in her house with a ramp. Pt has a sister than can assist at times and states she can have someone come from church to "sit with her." At this point, pt requires significantly more assist than this and would benefit from SNF rehab to see if pt will be capable of  returning home and living alone.  Will continue to see with focus on mobility and adls.      Recommendations for follow up therapy are one component of a multi-disciplinary discharge planning process, led by the attending physician.  Recommendations may be updated based on patient status, additional functional criteria and insurance authorization.   Follow Up Recommendations  Skilled nursing-short term rehab (<3 hours/day)    Assistance Recommended at Discharge Frequent or constant Supervision/Assistance  Patient can return home with the following A lot of help with walking and/or transfers;A lot of help with bathing/dressing/bathroom;Assist for transportation;Help with stairs or ramp for entrance    Functional Status Assessment  Patient has had a recent decline in their functional status and demonstrates the ability to make significant improvements in function  in a reasonable and predictable amount of time.  Equipment Recommendations  None recommended by OT    Recommendations for Other Services       Precautions / Restrictions Precautions Precautions: Fall Restrictions Weight Bearing Restrictions: No      Mobility Bed Mobility Overal bed mobility: Needs Assistance Bed Mobility: Rolling, Sidelying to Sit, Sit to Supine Rolling: Mod assist Sidelying to sit: Max assist   Sit to supine: Total assist   General bed mobility comments: Pt limited due to pain. Pt mildly hyperverbal and therefore talked through most mobility not listening to how to get to EOB safely and with lease amount of pain.  Pt did well when asked to stop and breath and listen to individual steps for mobility.    Transfers Overall transfer level: Needs assistance Equipment used: Rolling walker (2 wheels) Transfers: Sit to/from Stand, Bed to chair/wheelchair/BSC Sit to Stand: Mod assist, From elevated surface Stand pivot transfers: Max assist         General transfer comment: Pt required encouragement to use walker and put some weight on RLE so be able to advance LLE.      Balance Overall balance assessment: Needs assistance Sitting-balance support: Feet supported, Bilateral upper extremity supported Sitting balance-Leahy Scale: Fair   Postural control: Posterior lean Standing balance support: During functional activity, Bilateral upper extremity supported Standing balance-Leahy Scale: Poor Standing balance comment: pt dependent on outside support to stand.  ADL either performed or assessed with clinical judgement   ADL Overall ADL's : Needs assistance/impaired Eating/Feeding: Set up;Sitting;Bed level   Grooming: Wash/dry hands;Wash/dry face;Oral care;Set up;Sitting;Bed level   Upper Body Bathing: Set up;Sitting   Lower Body Bathing: Maximal assistance;+2 for physical assistance;Sit to/from stand   Upper Body  Dressing : Set up;Sitting   Lower Body Dressing: Maximal assistance;Sit to/from stand;Cueing for compensatory techniques Lower Body Dressing Details (indicate cue type and reason): Pt came to EOB and stood with min assist with bed raised. Unable to let go of walker to pull pants up. Toilet Transfer: Moderate assistance;Stand-pivot;BSC/3in1;Rolling walker (2 wheels) Toilet Transfer Details (indicate cue type and reason): Pt with a lot of pain sitting on BSC. Toileting- Clothing Manipulation and Hygiene: Maximal assistance;Sit to/from stand;Cueing for compensatory techniques Toileting - Clothing Manipulation Details (indicate cue type and reason): Pt could not let go of walker to clean self in standing.     Functional mobility during ADLs: Moderate assistance;+2 for physical assistance;Rolling walker (2 wheels) General ADL Comments: Pt most limited by pain.  Pt in extreme discomfort moaning with most mobiilty although was able to get into standing and side step to top of bed. Pt limited with LE adls.     Vision Baseline Vision/History: 1 Wears glasses Ability to See in Adequate Light: 0 Adequate Patient Visual Report: No change from baseline Vision Assessment?: No apparent visual deficits     Perception     Praxis      Pertinent Vitals/Pain Pain Assessment Pain Assessment: 0-10 Pain Score: 9  Pain Location: R hip, lower back, rib pain Pain Descriptors / Indicators: Aching, Discomfort, Crying, Cramping, Guarding, Throbbing Pain Intervention(s): Limited activity within patient's tolerance, Monitored during session, Repositioned, Patient requesting pain meds-RN notified, Other (comment) (will speak to nursing about something more than tylenol?)     Hand Dominance Right   Extremity/Trunk Assessment Upper Extremity Assessment Upper Extremity Assessment: Overall WFL for tasks assessed   Lower Extremity Assessment Lower Extremity Assessment: Defer to PT evaluation   Cervical / Trunk  Assessment Cervical / Trunk Assessment: Normal   Communication Communication Communication: No difficulties   Cognition Arousal/Alertness: Awake/alert Behavior During Therapy: Anxious Overall Cognitive Status: Within Functional Limits for tasks assessed                                       General Comments  Pt most limited by pain. States someone from church can come stay with her but at this point feel pt need significantly more assist than that and may need SNF rehab to be able to even consider returning home alone.    Exercises     Shoulder Instructions      Home Living Family/patient expects to be discharged to:: Private residence Living Arrangements: Alone Available Help at Discharge: Family;Available PRN/intermittently Type of Home: House Home Access: Ramped entrance     Home Layout: Two level;Able to live on main level with bedroom/bathroom     Bathroom Shower/Tub: Occupational psychologist: Handicapped height     Home Equipment: Grab bars - toilet;Rollator (4 wheels);Standard Walker   Additional Comments: Pt uses a walker with tennis balls on it.  Has a rollator (sounds like a rollator) that she does not use bc she states she cannot lift it.      Prior Functioning/Environment Prior Level of Function : Independent/Modified Independent  Mobility Comments: uses walker at all times. ADLs Comments: Pt can complete all adls pta without assist. If pt gets into the tub to take a bath, dauther comes to supervise.        OT Problem List: Decreased activity tolerance;Impaired balance (sitting and/or standing);Decreased knowledge of use of DME or AE;Pain      OT Treatment/Interventions: Self-care/ADL training;Therapeutic activities;DME and/or AE instruction;Balance training    OT Goals(Current goals can be found in the care plan section) Acute Rehab OT Goals Patient Stated Goal: to be able to be independent again OT Goal  Formulation: With patient Time For Goal Achievement: 03/06/22 Potential to Achieve Goals: Fair ADL Goals Pt Will Perform Grooming: with supervision;standing Pt Will Perform Lower Body Bathing: with min assist;sit to/from stand Pt Will Perform Lower Body Dressing: with min assist;sit to/from stand Pt Will Transfer to Toilet: with min guard assist;ambulating;grab bars Pt Will Perform Toileting - Clothing Manipulation and hygiene: with min guard assist;sit to/from stand  OT Frequency: Min 2X/week    Co-evaluation              AM-PAC OT "6 Clicks" Daily Activity     Outcome Measure Help from another person eating meals?: None Help from another person taking care of personal grooming?: None Help from another person toileting, which includes using toliet, bedpan, or urinal?: A Lot Help from another person bathing (including washing, rinsing, drying)?: A Lot Help from another person to put on and taking off regular upper body clothing?: A Little Help from another person to put on and taking off regular lower body clothing?: Total 6 Click Score: 16   End of Session Equipment Utilized During Treatment: Rolling walker (2 wheels) Nurse Communication: Mobility status  Activity Tolerance: Patient limited by pain Patient left: in bed;with call bell/phone within reach;with bed alarm set  OT Visit Diagnosis: Unsteadiness on feet (R26.81)                Time: 1022-1100 OT Time Calculation (min): 38 min Charges:  OT General Charges $OT Visit: 1 Visit OT Evaluation $OT Eval Moderate Complexity: 1 Mod OT Treatments $Self Care/Home Management : 8-22 mins  Glenford Peers 02/20/2022, 11:20 AM

## 2022-02-20 NOTE — TOC Initial Note (Addendum)
Transition of Care [facility]) - Initial/Assessment Note    Patient Details  Name: Kristin Willis MRN: [id number] Date of Birth: Jan 23, 1929  Transition of Care [facility]) CM/SW Contact:    Lennart Pall, LCSW Phone Number: 02/20/2022, 3:12 PM  Clinical Narrative:                 Met with pt and daughter this afternoon to introduce TOC/ CSW role and discuss therapy recommendations for SNF rehab.  Pt alert and fully oriented and daughter supportive.  They are aware and agreeable with this recommendation as, while daughter is supportive, she cannot meet pt's new care needs.  They prefer SNF in [place] and I have sent out her FL2/ information.  Await bed offer and ins auth.  ADDENDUM: Pt has accepted SNF bed at [facility] who can admit pt tomorrow.  MD aware and notes pt is medically cleared.  Will start insurance authorization.  Expected Discharge Plan: Skilled Nursing Facility Barriers to Discharge: Continued Medical Work up, Ship broker, SNF Pending bed offer   Patient Goals and CMS Choice Patient states their goals for this hospitalization and ongoing recovery are:: return home following rehab      Expected Discharge Plan and Services Expected Discharge Plan: [place] In-house Referral: Clinical Social Work   Post Acute Care Choice: [place] Living arrangements for the past 2 months: [place]                 DME Arranged: N/A DME Agency: NA                  Prior Living Arrangements/Services Living arrangements for the past 2 months: [place] Lives with:: Self Patient language and need for interpreter reviewed:: Yes Do you feel safe going back to the place where you live?: Yes      Need for Family Participation in Patient Care: Yes (Comment) Care giver support system in place?: Yes (comment)   Criminal Activity/Legal Involvement Pertinent to Current Situation/Hospitalization: No - Comment as  needed  Activities of Daily Living Home Assistive Devices/Equipment: Eyeglasses, Environmental consultant (specify type), Cane (specify quad or straight), Bedside commode/3-in-1 (call alert bracelet and necklace, alarm in shower, walk in tub) ADL Screening (condition at time of admission) Patient's cognitive ability adequate to safely complete daily activities?: No Is the patient deaf or have difficulty hearing?: Yes Does the patient have difficulty seeing, even when wearing glasses/contacts?: No Does the patient have difficulty concentrating, remembering, or making decisions?: No Patient able to express need for assistance with ADLs?: Yes Does the patient have difficulty dressing or bathing?: Yes Independently performs ADLs?: No Communication: Independent Dressing (OT): Needs assistance Is this a change from baseline?: Change from baseline, expected to last >3 days Grooming: Independent Feeding: Independent Bathing: Needs assistance Is this a change from baseline?: Change from baseline, expected to last >3 days Toileting: Needs assistance Is this a change from baseline?: Change from baseline, expected to last >3days In/Out Bed: Needs assistance Is this a change from baseline?: Change from baseline, expected to last >3 days Walks in Home: Needs assistance Is this a change from baseline?: Change from baseline, expected to last >3 days Does the patient have difficulty walking or climbing stairs?: Yes Weakness of Legs: Both Weakness of Arms/Hands: Right  Permission Sought/Granted Permission sought to share information with : Family Supports, Chartered certified accountant granted to share information with : Yes, Verbal Permission Granted  Share Information with NAME: Colletta [place]  Ward     Permission granted to share info w Relationship: daughter  Permission granted to share info w Contact Information: [phone]  Emotional Assessment Appearance:: Appears younger than stated  age Attitude/Demeanor/Rapport: Gracious Affect (typically observed): Accepting Orientation: : Oriented to Self, Oriented to Place, Oriented to  Time, Oriented to Situation Alcohol / Substance Use: Not Applicable Psych Involvement: No (comment)  Admission diagnosis:  Fall, initial encounter [W19.XXXA] Vertebral fracture, osteoporotic, initial encounter ([place]) [M80.08XA] Ambulatory dysfunction [R26.2] Patient Active Problem List   Diagnosis Date Noted   Ambulatory dysfunction 02/20/2022   Vertebral fracture, osteoporotic, initial encounter ([place]) 02/19/2022   DVT (deep venous thrombosis) ([place]) 02/19/2022   Fall at home, initial encounter 02/19/2022   Anemia 02/19/2022   Primary osteoarthritis of right shoulder 07/02/2021   Primary osteoarthritis of right hip 07/02/2021   Controlled type 2 diabetes mellitus with mild nonproliferative retinopathy of both eyes, without long-term current use of insulin ([place]) 06/19/2021   Moderate nonproliferative diabetic retinopathy of left eye ([place]) 10/16/2020   Moderate nonproliferative diabetic retinopathy of right eye ([place]) 10/16/2020   Posterior vitreous detachment of both eyes 10/16/2020   Nonproliferative diabetic retinopathy of right eye ([place]) 02/15/2017   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   Abnormality of gait 07/21/2016   Senile purpura ([place]) 08/07/2015   Mixed hyperlipidemia 01/29/2015   Microalbuminuria 01/10/2014   Urge incontinence 01/10/2014   History of stroke 10/24/2013   PAD (peripheral artery disease) ([place]) 07/11/2013   Peripheral neuropathy 01/10/2013   Osteoporosis 06/21/2012   Breast cancer ([place]) 09/01/2011   Gout 08/06/2011   Pure hypercholesterolemia 03/05/2011   DM (diabetes mellitus), type 2 with peripheral vascular complications ([place]) 63/87/5643   Essential hypertension, benign 03/05/2011   PCP:  Rita Ohara, MD Pharmacy:   [facility] [place], [address] AT [place] [address] [place] [address] Phone: [phone] Fax: [facility] [place], [place] [address] [place] [address] Phone: [phone] Fax: [phone]     Social Determinants of Health (SDOH) Interventions    Readmission Risk Interventions     No data to display

## 2022-02-20 NOTE — Progress Notes (Signed)
Physical Therapy Evaluation Patient Details Name: Kristin Willis MRN: [id number] DOB: 23-Jul-1929 Today's Date: 02/20/2022  History of Present Illness  Pt is a 72 female admitted after being found down at home after a fall. Pt sustained a R bruised hip, 12th rib fx and L1, L2 transverse process fx.  Pt was initailly seen by paramedics and refused to come to hosptial but agreed to come the next morning when she could no longer get up on her own.  Pt also with L shoulder pain but xray is negative.  Pt with h/o blood clots in RLE and on Xeralto.    Clinical Impression  Kristin Willis is 88 y.o. female admitted with above HPI and diagnosis. Patient is currently limited by functional impairments below (see PT problem list). Patient lives alone and is independent with RW for mobility at baseline. Patient is greatly limited by pain and anxiety related to mobilizing and required Max+2/Total assist to complete bed mobility and transfer sit<>stand. Patient will benefit from continued skilled PT interventions to address impairments and progress independence with mobility, recommending SNF rehab for follow up. Anticipate pt could improve with pain control. Acute PT will follow and progress as able.        Recommendations for follow up therapy are one component of a multi-disciplinary discharge planning process, led by the attending physician.  Recommendations may be updated based on patient status, additional functional criteria and insurance authorization.  Follow Up Recommendations Skilled nursing-short term rehab (<3 hours/day)    Assistance Recommended at Discharge Frequent or constant Supervision/Assistance  Patient can return home with the following  (P) Two people to help with walking and/or transfers;Assistance with cooking/housework;Direct supervision/assist for medications management;Direct supervision/assist for financial management;Assist for transportation;Help with stairs or ramp for  entrance;Two people to help with bathing/dressing/bathroom    Equipment Recommendations (P) None recommended by PT (TBA)  Recommendations for Other Services       Functional Status Assessment (P) Patient has had a recent decline in their functional status and demonstrates the ability to make significant improvements in function in a reasonable and predictable amount of time.     Precautions / Restrictions Precautions Precautions: Fall Restrictions Weight Bearing Restrictions: No      Mobility  Bed Mobility Overal bed mobility: Needs Assistance Bed Mobility: Rolling, Sidelying to Sit, Sit to Supine Rolling: Mod assist Sidelying to sit: Max assist, +2 for safety/equipment, +2 for physical assistance   Sit to supine: Total assist, +2 for safety/equipment, +2 for physical assistance   General bed mobility comments: (P) Pt required mod assist for rolling, able to pull from hand rail. Required Max-Total assist for supine to sit, limited by pain and guarding in Rt leg. Total Assist to return to supine and reposition in bed.    Transfers Overall transfer level: Needs assistance Equipment used: Rolling walker (2 wheels) Transfers: Sit to/from Stand Sit to Stand: From elevated surface, Max assist, +2 safety/equipment, +2 physical assistance           General transfer comment: Max encouragement to initiate stand. Pt required bed significantly elevated and 2+ Max assist to rise and facilitate anterior weight shift of hips and upright posture. Pt took small side steps along EOB with assist to manage walker and guide steps. returned to sit and back to bed.    Ambulation/Gait                  Stairs  Wheelchair Mobility    Modified Rankin (Stroke Patients Only)       Balance Overall balance assessment: Needs assistance Sitting-balance support: Feet supported, Bilateral upper extremity supported Sitting balance-Leahy Scale: Fair   Postural control:  Posterior lean Standing balance support: During functional activity, Bilateral upper extremity supported Standing balance-Leahy Scale: Poor Standing balance comment: pt dependent on outside support to stand.                             Pertinent Vitals/Pain Pain Assessment Pain Assessment: Faces Faces Pain Scale: Hurts worst Pain Location: R hip, lower back, rib pain Pain Descriptors / Indicators: Aching, Discomfort, Crying, Cramping, Guarding, Throbbing, Moaning Pain Intervention(s): Limited activity within patient's tolerance, Monitored during session, Repositioned    Home Living Family/patient expects to be discharged to:: Private residence Living Arrangements: Alone Available Help at Discharge: Family;Available PRN/intermittently Type of Home: House Home Access: Ramped entrance       Home Layout: Two level;Able to live on main level with bedroom/bathroom Home Equipment: Grab bars - toilet;Rollator (4 wheels);Standard Walker Additional Comments: Pt uses a walker with tennis balls on it.  Has a rollator (sounds like a rollator) that she does not use bc she states she cannot lift it.    Prior Function Prior Level of Function : Independent/Modified Independent             Mobility Comments: uses walker at all times. ADLs Comments: Pt can complete all adls pta without assist. If pt gets into the tub to take a bath, daugther comes to supervise.     Hand Dominance   Dominant Hand: Right    Extremity/Trunk Assessment   Upper Extremity Assessment Upper Extremity Assessment: Overall WFL for tasks assessed;Defer to OT evaluation    Lower Extremity Assessment Lower Extremity Assessment: Generalized weakness;RLE deficits/detail RLE Deficits / Details: pt with hx of DVT, reports limited by pain in groin and sensitive to touch RLE: Unable to fully assess due to pain    Cervical / Trunk Assessment Cervical / Trunk Assessment: Normal  Communication    Communication: No difficulties  Cognition Arousal/Alertness: Awake/alert Behavior During Therapy: Anxious Overall Cognitive Status: Within Functional Limits for tasks assessed                                          General Comments      Exercises     Assessment/Plan    PT Assessment (P) Patient needs continued PT services  PT Problem List (P) Decreased strength;Decreased activity tolerance;Decreased balance;Decreased mobility;Decreased cognition;Decreased safety awareness;Decreased knowledge of precautions;Decreased knowledge of use of DME;Pain;Obesity       PT Treatment Interventions (P) DME instruction;Gait training;Stair training;Functional mobility training;Therapeutic activities;Therapeutic exercise;Balance training;Neuromuscular re-education;Patient/family education    PT Goals (Current goals can be found in the Care Plan section)  Acute Rehab PT Goals Patient Stated Goal: (P) stop hurting PT Goal Formulation: (P) With patient Time For Goal Achievement: (P) 03/06/22 Potential to Achieve Goals: (P) Good    Frequency Min 3X/week     Co-evaluation               AM-PAC PT "6 Clicks" Mobility  Outcome Measure Help needed turning from your back to your side while in a flat bed without using bedrails?: (P) A Lot Help needed moving from lying on your back to sitting  on the side of a flat bed without using bedrails?: Total Help needed moving to and from a bed to a chair (including a wheelchair)?: (P) Total Help needed standing up from a chair using your arms (e.g., wheelchair or bedside chair)?: (P) Total Help needed to walk in hospital room?: Total Help needed climbing 3-5 steps with a railing? : Total 6 Click Score: (P) 7    End of Session Equipment Utilized During Treatment: Gait belt Activity Tolerance: Patient limited by pain Patient left: in bed;with bed alarm set;with call bell/phone within reach Nurse Communication: Mobility status PT  Visit Diagnosis: Other abnormalities of gait and mobility (R26.89);Unsteadiness on feet (R26.81)    Time: (P) 1245-(P) 1312 PT Time Calculation (min) (ACUTE ONLY): (P) 27 min   Charges:   PT Evaluation $PT Eval Low Complexity: (P) 1 Low PT Treatments $Therapeutic Activity: (P) 8-22 mins         Verner Mould, DPT Acute Rehabilitation Services Office [phone] Pager [phone]  02/20/22 4:09 PM

## 2022-02-20 NOTE — Progress Notes (Signed)
[facility] Progress Note  Patient: Kristin Willis     TTS:[id number]  DOA: 02/19/2022   PCP: Rita Ohara, MD       Brief hospital course: This is a 88 year old female with right leg DVT, hypertension, CVA, history of breast cancer and endometrial cancer who was brought to the EMS after a fall and inability to stand.  She lives alone at home.  She initially fell on 6/6.  EMS came to help her get up and she subsequently refused to go to the hospital.  She stayed home overnight and next morning realized that she was still having severe pain when attempting to ambulate specifically in the right hip left shoulder and back.  She therefore came into the ED. Imaging obtained in the ED: 9 mm calcification in the right frontal convexity, possibly a calcified meningioma versus a dural calcification.  No mass effect.  C-spine: Multilevel degenerative change T-spine: Nondisplaced left L1 transverse process fracture, displaced left L2 transverse process fracture.  Multilevel degenerative disc disease and facet arthropathy is severe. L-spine: Nondisplaced left L1 and displaced left L2 transverse process fracture.  Moderate to severe multilevel degenerative disc disease.  Moderate to severe spinal canal stenosis L3-L4 and L4-L5.  Severe left neuroforaminal stenosis at L4-L5.  CT hip Severe end-stage degenerative changes of the right hip without any acute fracture    Subjective:  Complains of the entire right leg hurting because of a blood clot.  Based on her description, her back pain appears to be present in the left flank area and is not in the midline.  She does not complain of any shoulder pain to me. She talks excessively and repeatedly goes off topic which makes it very difficult to obtain a proper history.  Assessment and Plan:  Principal Problem:   Ambulatory dysfunction- FAll Transverse process fractures Severe right hip osteoarthritis - may have been orthostatic in addition to  having chronic right hip arthritis -Patient declines to move to allow me to fully examine her  -there is a note in the computer from Dr.Xu stating that she has declined surgery in the past and has received injections instead- I spoke with Dr Erlinda Hong who recommends asking IR to inject her if needed- have contacted IR and waiting for more info on when this can be done being that she is on Xarelto  - She declines to take anything more than Tylenol 1000 mg 3 times daily for pain control  -She is agreeable to go to SNF and I have placed a consult for TOC  Active Problems:  Neural foraminal stenosis and spinal canal stenosis - Based on the imaging of her spine, she has numerous transverse process fractures along with some neuroforaminal stenosis any of which could be causing her back pain and inability to ambulate - as she has declined surgery we will not pursue an MRI and will manage her conservatively  DVT right leg-external iliac vein, common femoral vein and SFJ -Diagnosed 02/11/2022 and started on Xarelto - I am suspecting this occurred due to immobility  Anemia- ? Acute blood loss  - she wont allow a proper exam- may have subcutaneous bleeding related to the fall- will check Hgb again- if dropping, will hold Xarelto  Elevated BUN/Cr ratio - likely dehydrated- holding HCTZ      History of stroke -Currently on Xarelto-med rec states that she was on Plavix at one point but is not taking it at this time due to beginning [place]  hypertension, benign -Continue carvedilol - Hold hydrochlorothiazide as it is likely causing her to be dehydrated - BP 128/55  Chronic right leg ulcer - cont wound care      DVT prophylaxis: Xarelto   Code Status: Full Code  Consultants: phone discussions with Orthopedic surgery and neurosurgery Level of Care: Level of care: Telemetry Disposition Plan:  Status is: Observation- will change to intp   Objective:   Vitals:   02/20/22 0034 02/20/22  0528 02/20/22 0846 02/20/22 1334  BP: 128/61 (!) 131/59 (!) 126/53 (!) 128/55  Pulse: (!) 103 (!) 103 (!) 105 91  Resp: '18 18 20   '$ Temp: 97.7 F (36.5 C) 98.1 F (36.7 C) 98.5 F (36.9 C) 98.3 F (36.8 C)  TempSrc:  Oral Oral Oral  SpO2: 97% 100% 98% 95%   There were no vitals filed for this visit. Exam:  Will not allow a full physical exam General exam: Appears comfortable  HEENT: PERRLA, oral mucosa moist, no sclera icterus or thrush Respiratory system: Clear to auscultation. Respiratory effort normal. Cardiovascular system: S1 & S2 heard, regular rate and rhythm Gastrointestinal system: Abdomen soft, non-tender, nondistended. Normal bowel sounds   Central nervous system: Alert and oriented. No focal neurological deficits. Extremities: No cyanosis, clubbing - has edema of right  leg Skin: right leg ulcer on shin- no erythema Psychiatry:  Mood & affect appropriate.    Imaging and lab data was personally reviewed    CBC: Recent Labs  Lab 02/19/22 1615 02/20/22 0630  WBC 8.3 8.4  NEUTROABS 4.1  --   HGB 9.0* 7.9*  HCT 29.4* 25.9*  MCV 98.0 97.4  PLT 193 935   Basic Metabolic Panel: Recent Labs  Lab 02/19/22 1615 02/19/22 2058 02/20/22 0630  NA 142  --  142  K 3.8  --  4.0  CL 110  --  110  CO2 25  --  27  GLUCOSE 142*  --  149*  BUN 43*  --  39*  CREATININE 0.81  --  0.73  CALCIUM 9.3  --  8.7*  MG  --  2.1  --   PHOS  --  3.1  --    GFR: CrCl cannot be calculated (Unknown ideal weight.).  Scheduled Meds:  carvedilol  37.5 mg Oral BID   diclofenac Sodium  2 g Topical QID   ezetimibe  10 mg Oral Daily   gabapentin  300 mg Oral QHS   insulin aspart  0-9 Units Subcutaneous TID WC   oxyCODONE-acetaminophen  1 tablet Oral Once   rivaroxaban  15 mg Oral BID   Followed by   Derrill Memo ON 03/04/2022] rivaroxaban  20 mg Oral Q supper   Continuous Infusions:   LOS: 0 days   Author: Debbe Odea  02/20/2022 2:12 PM

## 2022-02-21 ENCOUNTER — Inpatient Hospital Stay (HOSPITAL_COMMUNITY): Payer: Medicare PPO

## 2022-02-21 DIAGNOSIS — I10 Essential (primary) hypertension: Secondary | ICD-10-CM | POA: Diagnosis not present

## 2022-02-21 DIAGNOSIS — M810 Age-related osteoporosis without current pathological fracture: Secondary | ICD-10-CM | POA: Diagnosis not present

## 2022-02-21 DIAGNOSIS — I824Y1 Acute embolism and thrombosis of unspecified deep veins of right proximal lower extremity: Secondary | ICD-10-CM | POA: Diagnosis not present

## 2022-02-21 DIAGNOSIS — K5900 Constipation, unspecified: Secondary | ICD-10-CM | POA: Diagnosis not present

## 2022-02-21 DIAGNOSIS — M25551 Pain in right hip: Secondary | ICD-10-CM | POA: Diagnosis not present

## 2022-02-21 DIAGNOSIS — G4700 Insomnia, unspecified: Secondary | ICD-10-CM | POA: Diagnosis not present

## 2022-02-21 DIAGNOSIS — M549 Dorsalgia, unspecified: Secondary | ICD-10-CM | POA: Diagnosis not present

## 2022-02-21 DIAGNOSIS — M1611 Unilateral primary osteoarthritis, right hip: Secondary | ICD-10-CM | POA: Diagnosis not present

## 2022-02-21 DIAGNOSIS — E119 Type 2 diabetes mellitus without complications: Secondary | ICD-10-CM | POA: Diagnosis not present

## 2022-02-21 DIAGNOSIS — W19XXXA Unspecified fall, initial encounter: Secondary | ICD-10-CM | POA: Diagnosis not present

## 2022-02-21 DIAGNOSIS — E785 Hyperlipidemia, unspecified: Secondary | ICD-10-CM | POA: Diagnosis not present

## 2022-02-21 DIAGNOSIS — I83018 Varicose veins of right lower extremity with ulcer other part of lower leg: Secondary | ICD-10-CM | POA: Diagnosis not present

## 2022-02-21 DIAGNOSIS — I82401 Acute embolism and thrombosis of unspecified deep veins of right lower extremity: Secondary | ICD-10-CM | POA: Diagnosis not present

## 2022-02-21 DIAGNOSIS — Z9181 History of falling: Secondary | ICD-10-CM | POA: Diagnosis not present

## 2022-02-21 DIAGNOSIS — D649 Anemia, unspecified: Secondary | ICD-10-CM | POA: Diagnosis not present

## 2022-02-21 DIAGNOSIS — S32019D Unspecified fracture of first lumbar vertebra, subsequent encounter for fracture with routine healing: Secondary | ICD-10-CM | POA: Diagnosis not present

## 2022-02-21 DIAGNOSIS — R262 Difficulty in walking, not elsewhere classified: Secondary | ICD-10-CM | POA: Diagnosis not present

## 2022-02-21 DIAGNOSIS — Y92009 Unspecified place in unspecified non-institutional (private) residence as the place of occurrence of the external cause: Secondary | ICD-10-CM | POA: Diagnosis not present

## 2022-02-21 DIAGNOSIS — S32028D Other fracture of second lumbar vertebra, subsequent encounter for fracture with routine healing: Secondary | ICD-10-CM | POA: Diagnosis not present

## 2022-02-21 DIAGNOSIS — Z743 Need for continuous supervision: Secondary | ICD-10-CM | POA: Diagnosis not present

## 2022-02-21 DIAGNOSIS — Z8673 Personal history of transient ischemic attack (TIA), and cerebral infarction without residual deficits: Secondary | ICD-10-CM | POA: Diagnosis not present

## 2022-02-21 DIAGNOSIS — N3281 Overactive bladder: Secondary | ICD-10-CM | POA: Diagnosis not present

## 2022-02-21 DIAGNOSIS — M8008XA Age-related osteoporosis with current pathological fracture, vertebra(e), initial encounter for fracture: Secondary | ICD-10-CM | POA: Diagnosis not present

## 2022-02-21 DIAGNOSIS — Z7401 Bed confinement status: Secondary | ICD-10-CM | POA: Diagnosis not present

## 2022-02-21 LAB — GLUCOSE, CAPILLARY
Glucose-Capillary: 137 mg/dL — ABNORMAL HIGH (ref 70–99)
Glucose-Capillary: 137 mg/dL — ABNORMAL HIGH (ref 70–99)
Glucose-Capillary: 148 mg/dL — ABNORMAL HIGH (ref 70–99)
Glucose-Capillary: 148 mg/dL — ABNORMAL HIGH (ref 70–99)
Glucose-Capillary: 161 mg/dL — ABNORMAL HIGH (ref 70–99)

## 2022-02-21 MED ORDER — VITAMIN B-12 1000 MCG PO TABS: 1000.0000 ug | ORAL_TABLET | Freq: Every day | ORAL | 0 refills | Status: AC

## 2022-02-21 MED ORDER — SODIUM CHLORIDE (PF) 0.9 % IJ SOLN
INTRAMUSCULAR | Status: AC
  Filled 2022-02-21: qty 10

## 2022-02-21 MED ORDER — ROPIVACAINE HCL 5 MG/ML IJ SOLN
15.0000 mL | Freq: Once | INTRAMUSCULAR | Status: AC
Start: 2022-02-21 — End: 2022-02-21
  Administered 2022-02-21: 10 mL via INTRA_ARTICULAR
  Filled 2022-02-21: qty 15

## 2022-02-21 MED ORDER — DICLOFENAC SODIUM 1 % EX GEL: 2.0000 g | Freq: Four times a day (QID) | CUTANEOUS | 0 refills | Status: AC

## 2022-02-21 MED ORDER — FERROUS SULFATE 325 (65 FE) MG PO TBEC: 325.0000 mg | DELAYED_RELEASE_TABLET | Freq: Two times a day (BID) | ORAL | 3 refills | Status: AC

## 2022-02-21 MED ORDER — MELATONIN 3 MG PO TABS: 3.0000 mg | ORAL_TABLET | Freq: Every day | ORAL | 0 refills | Status: AC

## 2022-02-21 MED ORDER — METHYLPREDNISOLONE ACETATE 80 MG/ML IJ SUSP
80.0000 mg | Freq: Once | INTRAMUSCULAR | Status: AC
Start: 2022-02-21 — End: 2022-02-21
  Administered 2022-02-21: 1 mg via INTRA_ARTICULAR
  Filled 2022-02-21: qty 1

## 2022-02-21 MED ORDER — IOHEXOL 300 MG/ML  SOLN
50.0000 mL | Freq: Once | INTRAMUSCULAR | Status: AC | PRN
  Administered 2022-02-21: 5 mL via INTRA_ARTICULAR

## 2022-02-21 MED ORDER — LIDOCAINE HCL (PF) 1 % IJ SOLN
20.0000 mL | Freq: Once | INTRAMUSCULAR | Status: AC
  Administered 2022-02-21: 10 mL
  Filled 2022-02-21: qty 20

## 2022-02-21 NOTE — Procedures (Signed)
Interventional Radiology Procedure Note  Risks and benefits of joint injection were discussed with the patient including, but not limited to bleeding, infection, damage to adjacent structures, allergic reaction, and extraarticular contrast injection.    All of the patient's questions were answered, patient is agreeable to proceed. Consent signed and in chart.  A timeout was performed with all members of the team prior to start of the procedure. Correct patient and correct procedure was confirmed. Allergies were reviewed.   PROCEDURE SUMMARY:  Successful fluoro guided right hip steroid injection. No immediate complications.  Patient tolerated well.   EBL = trace  Please see full dictation in imaging section of Epic for procedure details.   Solina Heron H Orla Estrin PA-C 02/21/2022 1:16 PM

## 2022-02-21 NOTE — TOC Transition Note (Signed)
Transition of Care [facility]) - CM/SW Discharge Note   Patient Details  Name: Kristin Willis MRN: [id number] Date of Birth: Dec 26, 1928  Transition [facility]) CM/SW Contact:  Lennart Pall, LCSW Phone Number: 02/21/2022, 1:58 PM   Clinical Narrative:    Pt medically cleared for dc today and have received insurance auth for SNF at [facility]  Pt and daughter aware and agreeable.  PTAR called at 1:55pm.  RN to call report to [phone] ext. [phone].  No further TOC needs.   Final next level of care: Skilled Nursing Facility Barriers to Discharge: Barriers Resolved   Patient Goals and CMS Choice Patient states their goals for this hospitalization and ongoing recovery are:: return home following rehab      Discharge Placement PASRR number recieved: 02/20/22            Patient chooses bed at: [place], [place] Patient to be transferred to facility by: [place] Name of family member notified: daughter, Colletta [place] Patient and family notified of of transfer: 02/21/22  Discharge Plan and Services In-house Referral: Clinical Social Work   Post Acute Care Choice: [place]          DME Arranged: N/A DME Agency: NA                  Social Determinants of Health ([place]) Interventions     Readmission Risk Interventions     No data to display

## 2022-02-21 NOTE — Progress Notes (Signed)
Called report to Cherylynn Ridges , RN at [facility]

## 2022-02-21 NOTE — Discharge Summary (Signed)
Physician Discharge Summary  Kristin Willis [facility] NWG:[id number] DOB: 07-Sep-1929 DOA: 02/19/2022  PCP: Rita Ohara, MD  Admit date: 02/19/2022 Discharge date: 02/21/2022 Discharging to: SNF Recommendations for Outpatient Follow-up:  Check Bmet and Hgb ion next Tuesday (6/13) Change UNNA boots on Mondays and Fridays please Wound care to follow RLE wound please  Consults:  IR Procedures:  Right hip depo-medrol injection   Discharge Diagnoses:   Principal Problem:   Ambulatory dysfunction Active Problems:   Vertebral fracture, osteoporotic, initial encounter ([place])   Pure hypercholesterolemia   DM (diabetes mellitus), type 2 with peripheral vascular complications ([place])   History of stroke   Essential hypertension, benign   Primary osteoarthritis of right hip   DVT (deep venous thrombosis) ([place])   Fall at home, initial encounter   Normocytic anemia     Hospital Course: This is a 88 year old female with right leg DVT, hypertension, CVA, history of breast cancer and endometrial cancer who was brought to the EMS after a fall and inability to stand.   She lives alone at home.  She initially fell on 6/6.  EMS came to help her get up and she subsequently refused to go to the hospital.  She stayed home overnight and next morning realized that she was still having severe pain when attempting to ambulate specifically in the right hip left shoulder and back.  She therefore came into the ED. Imaging obtained in the ED: 9 mm calcification in the right frontal convexity, possibly a calcified meningioma versus a dural calcification.  No mass effect.  C-spine: Multilevel degenerative change T-spine: Nondisplaced left L1 transverse process fracture, displaced left L2 transverse process fracture.  Multilevel degenerative disc disease and facet arthropathy is severe. L-spine: Nondisplaced left L1 and displaced left L2 transverse process fracture.  Moderate to severe multilevel degenerative disc disease.   Moderate to severe spinal canal stenosis L3-L4 and L4-L5.  Severe left neuroforaminal stenosis at L4-L5.  CT hip Severe end-stage degenerative changes of the right hip without any acute fracture   Principal Problem:   Ambulatory dysfunction acute on chronic Fall Transverse process fractures Severe right hip osteoarthritis - may have been orthostatic in addition to having chronic right hip arthritis -IR has injected right hip today which should help with her physical therapy - Continue Tylenol 3 times a day which is all she agrees to take -Currently she is a total assist and will be transitioning to skilled nursing facility   Active Problems:   Neural foraminal stenosis and spinal canal stenosis - Based on the imaging of her spine, she has 4 transverse process fractures along with some neuroforaminal stenosis   - as she has declined surgery, we will not pursue an MRI and will manage her conservatively   DVT right leg-external iliac vein, common femoral vein and SFJ -Diagnosed 02/11/2022 and started on Xarelto - I am suspecting this occurred due to immobility -She states that she has an appointment coming up with [facility] to follow-up on this   Anemia-normocytic -Hemoglobin 9.9 on 12/25/2021 -Given IV fluids Hemoglobin 7.9 yesterday a.m. and 8.0 yesterday evening  - I suspect her hemoglobin drop may be secondary to IV fluids -Iron level is 11, ferritin 11(low normal) -we will start oral iron - Vit B12 364-  will start oral replacement  Elevated BUN/Cr ratio - likely dehydrated- holding HCTZ      History of stroke -Currently on Xarelto-med rec states that she was on Plavix at one point but is not taking it at  this time due to beginning Xarelto     Essential hypertension, benign -Continue carvedilol - Hold hydrochlorothiazide as it is likely causing her to be dehydrated - BP 128/55   Chronic right leg ulcer -  with edema of right leg secondary to DVT - UNNA boot placed today-  change M/F at [facility]                  Discharge Instructions  Discharge Instructions     Diet - low sodium heart healthy   Complete by: As directed    Increase activity slowly   Complete by: As directed    No wound care   Complete by: As directed       Allergies as of 02/21/2022       Reactions   Ciprofloxacin Nausea And Vomiting   Codeine Nausea And Vomiting   Contrast Media [iodinated Contrast Media] Nausea And Vomiting   Morphine And Related Nausea And Vomiting   Statins Other (See Comments)   lethargic   Toprol Xl [metoprolol Tartrate]    Symptoms of heart attack, per pt, told not to take it again (?chest pain)   Clindamycin/lincomycin Itching, Rash   Penicillins Hives, Swelling, Rash   Has patient had a PCN reaction causing immediate rash, facial/tongue/throat swelling, SOB or lightheadedness with hypotension: no Has patient had a PCN reaction causing severe rash ivolving mucus membranes or skin necrosis: no Has patient had a PCN reaction that required hospitalization no Has patient had a PCN reaction occurring within the last 10 years: no If all of the above answers are "NO", then may proceed with Cephalosporin use.   Prednisone Palpitations        Medication List     STOP taking these medications    ciprofloxacin 500 MG tablet Commonly known as: CIPRO   hydrochlorothiazide 12.5 MG tablet Commonly known as: HYDRODIURIL   Klor-Con M10 10 MEQ tablet Generic drug: potassium chloride       TAKE these medications    acetaminophen 650 MG CR tablet Commonly known as: TYLENOL Take 1,300 mg by mouth in the morning, at noon, and at bedtime.   carvedilol 25 MG tablet Commonly known as: COREG TAKE 1 AND 1/2 TABLETS BY MOUTH TWICE DAILY   cholecalciferol 1000 units tablet Commonly known as: VITAMIN D Take 1,000 Units by mouth daily.   diclofenac Sodium 1 % Gel Commonly known as: VOLTAREN Apply 2 g topically 4 (four) times daily. Apply to lower  back   ezetimibe 10 MG tablet Commonly known as: ZETIA TAKE 1 TABLET BY MOUTH EVERY DAY   ferrous sulfate 325 (65 FE) MG EC tablet Take 1 tablet (325 mg total) by mouth 2 (two) times daily with a meal.   Fish Oil 1000 MG Caps Take 1 capsule by mouth 2 (two) times daily.   gabapentin 300 MG capsule Commonly known as: NEURONTIN TAKE 1 CAPSULE BY MOUTH EVERY DAY AT BEDTIME   losartan 100 MG tablet Commonly known as: COZAAR TAKE 1 TABLET BY MOUTH EVERY DAY   melatonin 3 MG Tabs tablet Take 1 tablet (3 mg total) by mouth at bedtime.   methenamine 1 g tablet Commonly known as: HIPREX Take 1 g by mouth 2 (two) times daily.   multivitamin tablet Take 1 tablet by mouth daily.   OSTEO BI-FLEX ADV TRIPLE ST PO Take 1 capsule by mouth daily.   OVER THE COUNTER MEDICATION Take 1 capsule by mouth in the morning and at bedtime. Curamin  PREVAGEN PO Take 1 tablet by mouth daily.   solifenacin 5 MG tablet Commonly known as: VESICARE Take 5 mg by mouth every evening.   TURMERIC PO Take 1 tablet by mouth daily.   vitamin B-12 1000 MCG tablet Commonly known as: CYANOCOBALAMIN Take 1 tablet (1,000 mcg total) by mouth daily.   Xarelto Starter Pack Generic drug: Rivaroxaban Stater Pack (15 mg and 20 mg) Take 15-20 mg by mouth as directed. Follow package directions: Take one '15mg'$  tablet by mouth twice daily for 21 Days. On Days 22-30, switch to one '20mg'$  tablet once a day. Take with food.             The results of significant diagnostics from this hospitalization (including imaging, microbiology, ancillary and laboratory) are listed below for reference.    CT L-SPINE NO CHARGE  Result Date: 02/19/2022 CLINICAL DATA:  Trauma EXAM: CT THORACIC AND LUMBAR SPINE WITHOUT CONTRAST TECHNIQUE: Multidetector CT imaging of the thoracic and lumbar spine was performed without contrast. Multiplanar CT image reconstructions were also generated. RADIATION DOSE REDUCTION: This exam was  performed according to the departmental dose-optimization program which includes automated exposure control, adjustment of the mA and/or kV according to patient size and/or use of iterative reconstruction technique. COMPARISON:  None Available. FINDINGS: CT THORACIC SPINE FINDINGS Alignment: There is degenerative anterolisthesis at T1-T2 and T2-T3. Vertebrae: There is no acute thoracic spine fracture. There is no aggressive osseous lesion. There is a nondisplaced left posterior twelfth rib fracture (series 4, image 138). Paraspinal and other soft tissues: Reported separately on CT chest. Disc levels: There is mild-to-moderate multilevel degenerative disc disease and facet arthropathy. No visible impingement by CT. CT LUMBAR SPINE FINDINGS Segmentation: There are 5 non-rib-bearing lumbar vertebrae. Alignment: Levoconvex lumbar curvature. There is grade 1 anterolisthesis at L3-L4. Vertebrae: Nondisplaced left L1 transverse process fracture. There is a displaced left L2 transverse process fracture. No acute vertebral body compression fracture. There is no aggressive osseous lesion. Pseudoarticulation of the L5 transverse processes with the sacrum. Paraspinal and other soft tissues: Reported separately on CT abdomen and pelvis. Left renal atrophy. Prominence of the left renal collecting system. Nonobstructive right renal stones. Disc levels: Moderate severe multilevel degenerative disc disease and severe multilevel facet arthropathy. Varying degrees of spinal canal and neural foraminal stenosis. Probable moderate to severe spinal canal stenosis at L3-L4 and L4-L5. Severe left neural foraminal stenosis at L4-L5. IMPRESSION: CT THORACIC SPINE IMPRESSION Nondisplaced left posterior twelfth rib fracture. No acute thoracic spine fracture. CT LUMBAR SPINE IMPRESSION Nondisplaced left L1 and displaced left L2 transverse process fractures. No vertebral body fracture. Levoconvex lumbar curvature. Moderate to severe multilevel  degenerative disc disease with varying degrees of spinal canal neural foraminal stenosis. Probable moderate-severe spinal canal stenosis at L3-L4 and L4-L5. Severe left neural foraminal stenosis at L4-L5. Electronically Signed   By: Maurine Simmering M.D.   On: 02/19/2022 16:59   CT T-SPINE NO CHARGE  Result Date: 02/19/2022 CLINICAL DATA:  Trauma EXAM: CT THORACIC AND LUMBAR SPINE WITHOUT CONTRAST TECHNIQUE: Multidetector CT imaging of the thoracic and lumbar spine was performed without contrast. Multiplanar CT image reconstructions were also generated. RADIATION DOSE REDUCTION: This exam was performed according to the departmental dose-optimization program which includes automated exposure control, adjustment of the mA and/or kV according to patient size and/or use of iterative reconstruction technique. COMPARISON:  None Available. FINDINGS: CT THORACIC SPINE FINDINGS Alignment: There is degenerative anterolisthesis at T1-T2 and T2-T3. Vertebrae: There is no acute thoracic spine fracture. There  is no aggressive osseous lesion. There is a nondisplaced left posterior twelfth rib fracture (series 4, image 138). Paraspinal and other soft tissues: Reported separately on CT chest. Disc levels: There is mild-to-moderate multilevel degenerative disc disease and facet arthropathy. No visible impingement by CT. CT LUMBAR SPINE FINDINGS Segmentation: There are 5 non-rib-bearing lumbar vertebrae. Alignment: Levoconvex lumbar curvature. There is grade 1 anterolisthesis at L3-L4. Vertebrae: Nondisplaced left L1 transverse process fracture. There is a displaced left L2 transverse process fracture. No acute vertebral body compression fracture. There is no aggressive osseous lesion. Pseudoarticulation of the L5 transverse processes with the sacrum. Paraspinal and other soft tissues: Reported separately on CT abdomen and pelvis. Left renal atrophy. Prominence of the left renal collecting system. Nonobstructive right renal stones. Disc  levels: Moderate severe multilevel degenerative disc disease and severe multilevel facet arthropathy. Varying degrees of spinal canal and neural foraminal stenosis. Probable moderate to severe spinal canal stenosis at L3-L4 and L4-L5. Severe left neural foraminal stenosis at L4-L5. IMPRESSION: CT THORACIC SPINE IMPRESSION Nondisplaced left posterior twelfth rib fracture. No acute thoracic spine fracture. CT LUMBAR SPINE IMPRESSION Nondisplaced left L1 and displaced left L2 transverse process fractures. No vertebral body fracture. Levoconvex lumbar curvature. Moderate to severe multilevel degenerative disc disease with varying degrees of spinal canal neural foraminal stenosis. Probable moderate-severe spinal canal stenosis at L3-L4 and L4-L5. Severe left neural foraminal stenosis at L4-L5. Electronically Signed   By: Maurine Simmering M.D.   On: 02/19/2022 16:59   CT HIP RIGHT WO CONTRAST  Result Date: 02/19/2022 CLINICAL DATA:  Right hip pain after fall EXAM: CT OF THE RIGHT HIP WITHOUT CONTRAST TECHNIQUE: Multidetector CT imaging of the right hip was performed according to the standard protocol. Multiplanar CT image reconstructions were also generated. RADIATION DOSE REDUCTION: This exam was performed according to the departmental dose-optimization program which includes automated exposure control, adjustment of the mA and/or kV according to patient size and/or use of iterative reconstruction technique. COMPARISON:  X-ray 02/19/2022 FINDINGS: Bones/Joint/Cartilage No acute fracture. No dislocation. Severe, end-stage degenerative changes of the right hip joint with complete joint space loss, subchondral sclerosis/cystic change, and bulky marginal osteophyte formation. There is axial migration of the humeral head with remodeling of the medial wall of the acetabulum. No large hip joint effusion. Severe osteoarthritis of the pubic symphysis. The pubic symphysis and right SI joint are intact without diastasis. No  suspicious lytic or sclerotic bone lesion. Ligaments Suboptimally assessed by CT. Muscles and Tendons No acute musculotendinous abnormality by CT. Generalized muscle atrophy. Soft tissues Mild induration within the subcutaneous fat overlying the hip. No hematoma. No right inguinal lymphadenopathy. Fat containing lower ventral abdominal wall hernia. Diverticular changes of the visualized sigmoid colon. IMPRESSION: 1. No acute fracture or dislocation of the right hip. 2. Severe, end-stage degenerative changes of the right hip joint. Electronically Signed   By: Davina Poke D.O.   On: 02/19/2022 16:58   CT CHEST ABDOMEN PELVIS WO CONTRAST  Result Date: 02/19/2022 CLINICAL DATA:  Status post fall, blunt abdominal trauma EXAM: CT CHEST, ABDOMEN AND PELVIS WITHOUT CONTRAST TECHNIQUE: Multidetector CT imaging of the chest, abdomen and pelvis was performed following the standard protocol without IV contrast. RADIATION DOSE REDUCTION: This exam was performed according to the departmental dose-optimization program which includes automated exposure control, adjustment of the mA and/or kV according to patient size and/or use of iterative reconstruction technique. COMPARISON:  None Available. FINDINGS: CT CHEST FINDINGS Cardiovascular: No significant vascular findings. Normal heart size. No pericardial  effusion. Thoracic aortic atherosclerosis. Coronary artery atherosclerosis. Mediastinum/Nodes: No enlarged mediastinal, hilar, or axillary lymph nodes. Thyroid gland, trachea, and esophagus demonstrate no significant findings. Lungs/Pleura: No focal consolidation, pleural effusion or pneumothorax. Mild right apical scarring. Reticulonodular interstitial disease in the periphery of the right middle lobe as can be seen with an infectious or inflammatory etiology including MAI. Mild lingular scarring versus atelectasis. Musculoskeletal: No acute osseous abnormality. Generalized osteopenia. CT ABDOMEN PELVIS FINDINGS Lower  chest: No acute abnormality. Hepatobiliary: No focal liver abnormality is seen. No gallstones, gallbladder wall thickening, or biliary dilatation. Pancreas: Unremarkable. No pancreatic ductal dilatation or surrounding inflammatory changes. Spleen: Normal in size without focal abnormality. Adrenals/Urinary Tract: Adrenal glands are unremarkable. Multiple nonobstructing right renal calculi with the largest measuring 9 mm. 4 cm right renal cyst for which no follow-up imaging is recommended. Atrophic left kidney. Prominent left extrarenal pelvis. Normal bladder. Stomach/Bowel: Stomach is within normal limits. No evidence of bowel wall thickening, distention, or inflammatory changes. Diverticulosis without evidence of diverticulitis. Vascular/Lymphatic: Aortic atherosclerosis. No enlarged abdominal or pelvic lymph nodes. Reproductive: Status post hysterectomy. No adnexal masses. Other: Fat containing right inguinal hernia. Fat containing umbilical hernia. Musculoskeletal: No acute osseous abnormality. No aggressive osseous lesion. Severe osteoarthritis of the right hip. Mild osteoarthritis of the left hip. Diffuse lumbar spine spondylosis. IMPRESSION: 1. No acute traumatic injury to the chest, abdomen or pelvis. 2. Reticulonodular interstitial disease in the periphery of the right middle lobe as can be seen with an infectious or inflammatory etiology including MAI. 3. Diverticulosis without evidence of diverticulitis. 4. Multiple nonobstructing right renal calculi with the largest measuring 9 mm. 5. Severe osteoarthritis of the right hip. 6. Aortic Atherosclerosis (ICD10-I70.0). Electronically Signed   By: Kathreen Devoid M.D.   On: 02/19/2022 16:56   CT Head Wo Contrast  Result Date: 02/19/2022 CLINICAL DATA:  Head trauma, intracranial arterial injury suspected; Neck trauma (Age >= 65y) EXAM: CT HEAD WITHOUT CONTRAST CT CERVICAL SPINE WITHOUT CONTRAST TECHNIQUE: Multidetector CT imaging of the head and cervical spine  was performed following the standard protocol without intravenous contrast. Multiplanar CT image reconstructions of the cervical spine were also generated. RADIATION DOSE REDUCTION: This exam was performed according to the departmental dose-optimization program which includes automated exposure control, adjustment of the mA and/or kV according to patient size and/or use of iterative reconstruction technique. COMPARISON:  None Available. FINDINGS: CT HEAD FINDINGS Brain: No evidence of acute infarction, hemorrhage, hydrocephalus, or extra-axial collection. Approximately 9 mm area of calcification along the inner table of the calvarium along the lower right frontal convexity. No significant mass effect or adjacent edema. Patchy white matter hypodensities, nonspecific but compatible with chronic microvascular ischemic disease. Vascular: No hyperdense vessel identified. Calcific intracranial atherosclerosis. Skull: No evidence of acute fracture. Sinuses/Orbits: Visualized sinuses are clear. No acute orbital findings. Other: No mastoid effusions. CT CERVICAL SPINE FINDINGS Alignment: Mild anterolisthesis of C3 on C4, C4 on C5 and T1 on T2, favored degenerative in etiology given facet arthropathy at these levels. Otherwise, no substantial sagittal subluxation. Skull base and vertebrae: Vertebral body heights are maintained. No evidence of acute fracture. Soft tissues and spinal canal: No prevertebral fluid or swelling. No visible canal hematoma. Disc levels: Multilevel degenerative disc disease, greatest at C4-C5. Multilevel facet and uncovertebral hypertrophy with varying degrees of neural foraminal stenosis Upper chest: Visualized lung apices are clear. IMPRESSION: CT head: 1. No evidence of acute intracranial abnormality. 2. Chronic microvascular ischemic disease. 3. Approximately 9 mm area of calcification along the  lower right frontal convexity, possibly a calcified meningioma versus dural calcification. No  significant mass effect or adjacent edema. CT cervical spine: 1. No evidence of acute fracture or traumatic malalignment. 2. Multilevel degenerative change, detailed above. Electronically Signed   By: Margaretha Sheffield M.D.   On: 02/19/2022 16:52   CT Cervical Spine Wo Contrast  Result Date: 02/19/2022 CLINICAL DATA:  Head trauma, intracranial arterial injury suspected; Neck trauma (Age >= 65y) EXAM: CT HEAD WITHOUT CONTRAST CT CERVICAL SPINE WITHOUT CONTRAST TECHNIQUE: Multidetector CT imaging of the head and cervical spine was performed following the standard protocol without intravenous contrast. Multiplanar CT image reconstructions of the cervical spine were also generated. RADIATION DOSE REDUCTION: This exam was performed according to the departmental dose-optimization program which includes automated exposure control, adjustment of the mA and/or kV according to patient size and/or use of iterative reconstruction technique. COMPARISON:  None Available. FINDINGS: CT HEAD FINDINGS Brain: No evidence of acute infarction, hemorrhage, hydrocephalus, or extra-axial collection. Approximately 9 mm area of calcification along the inner table of the calvarium along the lower right frontal convexity. No significant mass effect or adjacent edema. Patchy white matter hypodensities, nonspecific but compatible with chronic microvascular ischemic disease. Vascular: No hyperdense vessel identified. Calcific intracranial atherosclerosis. Skull: No evidence of acute fracture. Sinuses/Orbits: Visualized sinuses are clear. No acute orbital findings. Other: No mastoid effusions. CT CERVICAL SPINE FINDINGS Alignment: Mild anterolisthesis of C3 on C4, C4 on C5 and T1 on T2, favored degenerative in etiology given facet arthropathy at these levels. Otherwise, no substantial sagittal subluxation. Skull base and vertebrae: Vertebral body heights are maintained. No evidence of acute fracture. Soft tissues and spinal canal: No  prevertebral fluid or swelling. No visible canal hematoma. Disc levels: Multilevel degenerative disc disease, greatest at C4-C5. Multilevel facet and uncovertebral hypertrophy with varying degrees of neural foraminal stenosis Upper chest: Visualized lung apices are clear. IMPRESSION: CT head: 1. No evidence of acute intracranial abnormality. 2. Chronic microvascular ischemic disease. 3. Approximately 9 mm area of calcification along the lower right frontal convexity, possibly a calcified meningioma versus dural calcification. No significant mass effect or adjacent edema. CT cervical spine: 1. No evidence of acute fracture or traumatic malalignment. 2. Multilevel degenerative change, detailed above. Electronically Signed   By: Margaretha Sheffield M.D.   On: 02/19/2022 16:52   DG Hip Unilat W or Wo Pelvis 2-3 Views Right  Result Date: 02/19/2022 CLINICAL DATA:  Fall.  RIGHT hip pain EXAM: DG HIP (WITH OR WITHOUT PELVIS) 2-3V RIGHT COMPARISON:  07/02/2021 FINDINGS: Hips are located. There is severe osteophytosis of the RIGHT femoral neck similar to comparison exam. There is loss of joint space and sclerosis. No evidence of traumatic fracture. No pelvic fracture or sacral fracture. IMPRESSION: 1. Severe arthropathy of the RIGHT hip joint similar comparison exam. 2. No radiographic evidence of fracture. RIGHT femoral neck partially obscured by osteophytes. If there is a high clinical suspicion of hip fracture consider CT. Electronically Signed   By: Suzy Bouchard M.D.   On: 02/19/2022 15:35   DG Shoulder Left  Result Date: 02/19/2022 CLINICAL DATA:  Status post fall, shoulder pain EXAM: LEFT SHOULDER - 2+ VIEW COMPARISON:  None Available. FINDINGS: No acute fracture or dislocation. No aggressive osseous lesion. Normal alignment. Mild arthropathy of the acromioclavicular joint. Amorphous calcification adjacent to the greater tuberosity likely reflecting mild calcific tendinosis. Soft tissue are unremarkable. No  radiopaque foreign body or soft tissue emphysema. IMPRESSION: 1. No acute osseous injury of the left  shoulder. Electronically Signed   By: Kathreen Devoid M.D.   On: 02/19/2022 15:30   VAS [name] LOWER EXTREMITY VENOUS (DVT)  Result Date: 02/11/2022  Lower Venous DVT Study Patient Name:  TONESHA TSOU  Date of Exam:   02/11/2022 Medical Rec #: [id number]        Accession #:    [id number] Date of Birth: 06/28/1929        Patient Gender: F Patient Age:   55 years Exam Location:  [facility] Procedure:      VAS [name] LOWER EXTREMITY VENOUS (DVT) Referring Phys: EVE KNAPP --------------------------------------------------------------------------------  Indications: Swelling. Other Indications: Patient complains of right calf swelling for several months.                    She denies shortness of breath. Risk Factors: None identified. Performing Technologist: Wilkie Aye RVT  Examination Guidelines: A complete evaluation includes B-mode imaging, spectral Doppler, color Doppler, and power Doppler as needed of all accessible portions of each vessel. Bilateral testing is considered an integral part of a complete examination. Limited examinations for reoccurring indications may be performed as noted. The reflux portion of the exam is performed with the patient in reverse Trendelenburg.  +---------+---------------+---------+-----------+-------------+---------------+ RIGHT    CompressibilityPhasicitySpontaneityProperties   Thrombus Aging  +---------+---------------+---------+-----------+-------------+---------------+ CFV      None           No       No         softly       Age                                                         echogenic    Indeterminate   +---------+---------------+---------+-----------+-------------+---------------+ SFJ      Partial        Yes      Yes        softly       Age                                                         echogenic    Indeterminate    +---------+---------------+---------+-----------+-------------+---------------+ FV Prox  Full           Yes      Yes                                     +---------+---------------+---------+-----------+-------------+---------------+ FV Mid   Partial        No       No         retracted    Chronic         +---------+---------------+---------+-----------+-------------+---------------+ FV DistalFull           Yes      Yes                                     +---------+---------------+---------+-----------+-------------+---------------+ PFV      Full  Yes      Yes                                     +---------+---------------+---------+-----------+-------------+---------------+ POP      Partial        Yes      Yes                                     +---------+---------------+---------+-----------+-------------+---------------+ PTV      Full           Yes      Yes                                     +---------+---------------+---------+-----------+-------------+---------------+ PERO     Full           Yes      Yes                                     +---------+---------------+---------+-----------+-------------+---------------+ Gastroc  Full                                                            +---------+---------------+---------+-----------+-------------+---------------+ GSV      Full           Yes      Yes                                     +---------+---------------+---------+-----------+-------------+---------------+ DVT appears to extend in to the distal external iliac vein. Patent common iliac vein and IVC.  +----+---------------+---------+-----------+----------+--------------+ LEFTCompressibilityPhasicitySpontaneityPropertiesThrombus Aging +----+---------------+---------+-----------+----------+--------------+ CFV Full           Yes      Yes                                  +----+---------------+---------+-----------+----------+--------------+    Findings reported to [place] from [facility] at 1:30 pm. Patient instructed to go home and wait for further instructions.  Summary: RIGHT: - Findings consistent with age indeterminate deep vein thrombosis involving the right common femoral vein, and SF junction. - Findings consistent with chronic deep vein thrombosis involving the right femoral vein. - DVT appears to extend in to the distal external iliac vein. Patent common iliac vein and IVC.  LEFT: - No evidence of common femoral vein obstruction.  *See table(s) above for measurements and observations. Electronically signed by Carlyle Dolly MD on 02/11/2022 at 3:19:48 PM.    Final    Labs:   Basic Metabolic Panel: Recent Labs  Lab 02/19/22 1615 02/19/22 2058 02/20/22 0630  NA 142  --  142  K 3.8  --  4.0  CL 110  --  110  CO2 25  --  27  GLUCOSE 142*  --  149*  BUN 43*  --  39*  CREATININE 0.81  --  0.73  CALCIUM 9.3  --  8.7*  MG  --  2.1  --   PHOS  --  3.1  --      CBC: Recent Labs  Lab 02/19/22 1615 02/20/22 0630 02/20/22 1539  WBC 8.3 8.4 11.7*  NEUTROABS 4.1  --   --   HGB 9.0* 7.9* 8.0*  HCT 29.4* 25.9* 26.4*  MCV 98.0 97.4 98.5  PLT 193 171 171         SIGNED:   Debbe Odea, MD  [facility] 02/21/2022, 1:43 PM

## 2022-02-21 NOTE — Progress Notes (Signed)
Orthopedic Tech Progress Note Patient Details:  Kristin Willis [facility] [birth year] [id number]  Ortho Devices Type of Ortho Device: Haematologist Ortho Device/Splint Location: Right leg Ortho Device/Splint Interventions: Application   Post Interventions Patient Tolerated: Well  Kristin Willis Kristin Willis 02/21/2022, 10:32 AM

## 2022-02-22 DIAGNOSIS — M810 Age-related osteoporosis without current pathological fracture: Secondary | ICD-10-CM | POA: Diagnosis not present

## 2022-02-22 DIAGNOSIS — D649 Anemia, unspecified: Secondary | ICD-10-CM | POA: Diagnosis not present

## 2022-02-22 DIAGNOSIS — M549 Dorsalgia, unspecified: Secondary | ICD-10-CM | POA: Diagnosis not present

## 2022-02-22 DIAGNOSIS — R262 Difficulty in walking, not elsewhere classified: Secondary | ICD-10-CM | POA: Diagnosis not present

## 2022-02-25 DIAGNOSIS — G4700 Insomnia, unspecified: Secondary | ICD-10-CM | POA: Diagnosis not present

## 2022-02-25 DIAGNOSIS — M810 Age-related osteoporosis without current pathological fracture: Secondary | ICD-10-CM | POA: Diagnosis not present

## 2022-02-25 DIAGNOSIS — D649 Anemia, unspecified: Secondary | ICD-10-CM | POA: Diagnosis not present

## 2022-02-25 DIAGNOSIS — I10 Essential (primary) hypertension: Secondary | ICD-10-CM | POA: Diagnosis not present

## 2022-02-25 DIAGNOSIS — I82401 Acute embolism and thrombosis of unspecified deep veins of right lower extremity: Secondary | ICD-10-CM | POA: Diagnosis not present

## 2022-02-25 DIAGNOSIS — Z9181 History of falling: Secondary | ICD-10-CM | POA: Diagnosis not present

## 2022-02-25 DIAGNOSIS — E785 Hyperlipidemia, unspecified: Secondary | ICD-10-CM | POA: Diagnosis not present

## 2022-02-25 DIAGNOSIS — K5900 Constipation, unspecified: Secondary | ICD-10-CM | POA: Diagnosis not present

## 2022-02-25 DIAGNOSIS — N3281 Overactive bladder: Secondary | ICD-10-CM | POA: Diagnosis not present

## 2022-03-01 ENCOUNTER — Other Ambulatory Visit: Payer: Self-pay | Admitting: Family Medicine

## 2022-03-01 DIAGNOSIS — Z8673 Personal history of transient ischemic attack (TIA), and cerebral infarction without residual deficits: Secondary | ICD-10-CM

## 2022-03-05 DIAGNOSIS — I83018 Varicose veins of right lower extremity with ulcer other part of lower leg: Secondary | ICD-10-CM | POA: Diagnosis not present

## 2022-03-12 DIAGNOSIS — I83018 Varicose veins of right lower extremity with ulcer other part of lower leg: Secondary | ICD-10-CM | POA: Diagnosis not present

## 2022-03-21 DIAGNOSIS — M810 Age-related osteoporosis without current pathological fracture: Secondary | ICD-10-CM | POA: Diagnosis not present

## 2022-03-21 DIAGNOSIS — G4700 Insomnia, unspecified: Secondary | ICD-10-CM | POA: Diagnosis not present

## 2022-03-21 DIAGNOSIS — G459 Transient cerebral ischemic attack, unspecified: Secondary | ICD-10-CM | POA: Diagnosis not present

## 2022-03-21 DIAGNOSIS — D649 Anemia, unspecified: Secondary | ICD-10-CM | POA: Diagnosis not present

## 2022-03-21 DIAGNOSIS — E538 Deficiency of other specified B group vitamins: Secondary | ICD-10-CM | POA: Diagnosis not present

## 2022-03-21 DIAGNOSIS — N3281 Overactive bladder: Secondary | ICD-10-CM | POA: Diagnosis not present

## 2022-03-21 DIAGNOSIS — E785 Hyperlipidemia, unspecified: Secondary | ICD-10-CM | POA: Diagnosis not present

## 2022-03-21 DIAGNOSIS — S32019A Unspecified fracture of first lumbar vertebra, initial encounter for closed fracture: Secondary | ICD-10-CM | POA: Diagnosis not present

## 2022-03-21 DIAGNOSIS — G629 Polyneuropathy, unspecified: Secondary | ICD-10-CM | POA: Diagnosis not present

## 2022-03-25 DIAGNOSIS — E119 Type 2 diabetes mellitus without complications: Secondary | ICD-10-CM | POA: Diagnosis not present

## 2022-03-25 DIAGNOSIS — E782 Mixed hyperlipidemia: Secondary | ICD-10-CM | POA: Diagnosis not present

## 2022-03-25 DIAGNOSIS — Z79899 Other long term (current) drug therapy: Secondary | ICD-10-CM | POA: Diagnosis not present

## 2022-03-25 DIAGNOSIS — D518 Other vitamin B12 deficiency anemias: Secondary | ICD-10-CM | POA: Diagnosis not present

## 2022-03-25 DIAGNOSIS — E038 Other specified hypothyroidism: Secondary | ICD-10-CM | POA: Diagnosis not present

## 2022-03-25 DIAGNOSIS — E559 Vitamin D deficiency, unspecified: Secondary | ICD-10-CM | POA: Diagnosis not present

## 2022-03-28 DIAGNOSIS — I739 Peripheral vascular disease, unspecified: Secondary | ICD-10-CM | POA: Diagnosis not present

## 2022-03-28 DIAGNOSIS — Z7901 Long term (current) use of anticoagulants: Secondary | ICD-10-CM | POA: Diagnosis not present

## 2022-03-28 DIAGNOSIS — I998 Other disorder of circulatory system: Secondary | ICD-10-CM | POA: Diagnosis not present

## 2022-03-28 DIAGNOSIS — R195 Other fecal abnormalities: Secondary | ICD-10-CM | POA: Diagnosis not present

## 2022-03-28 DIAGNOSIS — I10 Essential (primary) hypertension: Secondary | ICD-10-CM | POA: Diagnosis not present

## 2022-03-28 DIAGNOSIS — I82409 Acute embolism and thrombosis of unspecified deep veins of unspecified lower extremity: Secondary | ICD-10-CM | POA: Diagnosis not present

## 2022-03-28 DIAGNOSIS — R791 Abnormal coagulation profile: Secondary | ICD-10-CM | POA: Diagnosis not present

## 2022-03-28 DIAGNOSIS — I82511 Chronic embolism and thrombosis of right femoral vein: Secondary | ICD-10-CM | POA: Diagnosis not present

## 2022-03-28 DIAGNOSIS — D62 Acute posthemorrhagic anemia: Secondary | ICD-10-CM | POA: Diagnosis not present

## 2022-03-28 DIAGNOSIS — K921 Melena: Secondary | ICD-10-CM | POA: Diagnosis not present

## 2022-03-28 DIAGNOSIS — D649 Anemia, unspecified: Secondary | ICD-10-CM | POA: Diagnosis not present

## 2022-03-28 DIAGNOSIS — E11621 Type 2 diabetes mellitus with foot ulcer: Secondary | ICD-10-CM | POA: Diagnosis not present

## 2022-03-28 DIAGNOSIS — M160 Bilateral primary osteoarthritis of hip: Secondary | ICD-10-CM | POA: Diagnosis not present

## 2022-03-28 DIAGNOSIS — L97319 Non-pressure chronic ulcer of right ankle with unspecified severity: Secondary | ICD-10-CM | POA: Diagnosis not present

## 2022-03-28 DIAGNOSIS — E1151 Type 2 diabetes mellitus with diabetic peripheral angiopathy without gangrene: Secondary | ICD-10-CM | POA: Diagnosis not present

## 2022-03-28 DIAGNOSIS — D689 Coagulation defect, unspecified: Secondary | ICD-10-CM | POA: Diagnosis not present

## 2022-03-29 ENCOUNTER — Other Ambulatory Visit: Payer: Self-pay | Admitting: Family Medicine

## 2022-03-29 DIAGNOSIS — R791 Abnormal coagulation profile: Secondary | ICD-10-CM | POA: Diagnosis not present

## 2022-03-29 DIAGNOSIS — R140 Abdominal distension (gaseous): Secondary | ICD-10-CM | POA: Diagnosis not present

## 2022-03-29 DIAGNOSIS — E11628 Type 2 diabetes mellitus with other skin complications: Secondary | ICD-10-CM | POA: Diagnosis not present

## 2022-03-29 DIAGNOSIS — E1151 Type 2 diabetes mellitus with diabetic peripheral angiopathy without gangrene: Secondary | ICD-10-CM | POA: Diagnosis not present

## 2022-03-29 DIAGNOSIS — K921 Melena: Secondary | ICD-10-CM | POA: Diagnosis not present

## 2022-03-29 DIAGNOSIS — I10 Essential (primary) hypertension: Secondary | ICD-10-CM

## 2022-03-29 DIAGNOSIS — D62 Acute posthemorrhagic anemia: Secondary | ICD-10-CM | POA: Diagnosis not present

## 2022-03-29 DIAGNOSIS — D649 Anemia, unspecified: Secondary | ICD-10-CM | POA: Diagnosis not present

## 2022-03-29 DIAGNOSIS — Z7901 Long term (current) use of anticoagulants: Secondary | ICD-10-CM | POA: Diagnosis not present

## 2022-03-29 DIAGNOSIS — M160 Bilateral primary osteoarthritis of hip: Secondary | ICD-10-CM | POA: Diagnosis not present

## 2022-03-29 DIAGNOSIS — I82409 Acute embolism and thrombosis of unspecified deep veins of unspecified lower extremity: Secondary | ICD-10-CM | POA: Diagnosis not present

## 2022-03-29 DIAGNOSIS — L97319 Non-pressure chronic ulcer of right ankle with unspecified severity: Secondary | ICD-10-CM | POA: Diagnosis not present

## 2022-03-29 DIAGNOSIS — I82511 Chronic embolism and thrombosis of right femoral vein: Secondary | ICD-10-CM | POA: Diagnosis not present

## 2022-03-30 DIAGNOSIS — K921 Melena: Secondary | ICD-10-CM | POA: Diagnosis not present

## 2022-03-30 DIAGNOSIS — R791 Abnormal coagulation profile: Secondary | ICD-10-CM | POA: Diagnosis not present

## 2022-03-30 DIAGNOSIS — D62 Acute posthemorrhagic anemia: Secondary | ICD-10-CM | POA: Diagnosis not present

## 2022-03-30 DIAGNOSIS — I10 Essential (primary) hypertension: Secondary | ICD-10-CM | POA: Diagnosis not present

## 2022-03-30 DIAGNOSIS — I82511 Chronic embolism and thrombosis of right femoral vein: Secondary | ICD-10-CM | POA: Diagnosis not present

## 2022-03-30 DIAGNOSIS — M160 Bilateral primary osteoarthritis of hip: Secondary | ICD-10-CM | POA: Diagnosis not present

## 2022-03-30 DIAGNOSIS — D649 Anemia, unspecified: Secondary | ICD-10-CM | POA: Diagnosis not present

## 2022-03-30 DIAGNOSIS — Z7901 Long term (current) use of anticoagulants: Secondary | ICD-10-CM | POA: Diagnosis not present

## 2022-03-30 DIAGNOSIS — E1151 Type 2 diabetes mellitus with diabetic peripheral angiopathy without gangrene: Secondary | ICD-10-CM | POA: Diagnosis not present

## 2022-03-30 DIAGNOSIS — E11628 Type 2 diabetes mellitus with other skin complications: Secondary | ICD-10-CM | POA: Diagnosis not present

## 2022-03-30 DIAGNOSIS — L97319 Non-pressure chronic ulcer of right ankle with unspecified severity: Secondary | ICD-10-CM | POA: Diagnosis not present

## 2022-03-31 DIAGNOSIS — E11628 Type 2 diabetes mellitus with other skin complications: Secondary | ICD-10-CM | POA: Diagnosis not present

## 2022-03-31 DIAGNOSIS — Z7901 Long term (current) use of anticoagulants: Secondary | ICD-10-CM | POA: Diagnosis not present

## 2022-03-31 DIAGNOSIS — D649 Anemia, unspecified: Secondary | ICD-10-CM | POA: Diagnosis not present

## 2022-03-31 DIAGNOSIS — I10 Essential (primary) hypertension: Secondary | ICD-10-CM | POA: Diagnosis not present

## 2022-03-31 DIAGNOSIS — M160 Bilateral primary osteoarthritis of hip: Secondary | ICD-10-CM | POA: Diagnosis not present

## 2022-03-31 DIAGNOSIS — R791 Abnormal coagulation profile: Secondary | ICD-10-CM | POA: Diagnosis not present

## 2022-03-31 DIAGNOSIS — I739 Peripheral vascular disease, unspecified: Secondary | ICD-10-CM | POA: Diagnosis not present

## 2022-03-31 DIAGNOSIS — E1151 Type 2 diabetes mellitus with diabetic peripheral angiopathy without gangrene: Secondary | ICD-10-CM | POA: Diagnosis not present

## 2022-03-31 DIAGNOSIS — D62 Acute posthemorrhagic anemia: Secondary | ICD-10-CM | POA: Diagnosis not present

## 2022-03-31 DIAGNOSIS — I82511 Chronic embolism and thrombosis of right femoral vein: Secondary | ICD-10-CM | POA: Diagnosis not present

## 2022-03-31 DIAGNOSIS — L97319 Non-pressure chronic ulcer of right ankle with unspecified severity: Secondary | ICD-10-CM | POA: Diagnosis not present

## 2022-03-31 DIAGNOSIS — K921 Melena: Secondary | ICD-10-CM | POA: Diagnosis not present

## 2022-03-31 NOTE — Telephone Encounter (Signed)
These were discontinued by ER doc 02/21/22-was she supposed to resume? Not sure if I should refill or not.

## 2022-03-31 NOTE — Telephone Encounter (Signed)
Last I heard was that she was in rehab.  Hasn't had any follow-up since then. Has no appointment until October. (She has a CBC scheduled for 8/10, lab visit, probably not needed since she has had CBC's since this was recommended in May--had one today, in fact)

## 2022-04-01 DIAGNOSIS — I517 Cardiomegaly: Secondary | ICD-10-CM | POA: Diagnosis not present

## 2022-04-01 DIAGNOSIS — D62 Acute posthemorrhagic anemia: Secondary | ICD-10-CM | POA: Diagnosis not present

## 2022-04-01 DIAGNOSIS — R791 Abnormal coagulation profile: Secondary | ICD-10-CM | POA: Diagnosis not present

## 2022-04-01 DIAGNOSIS — L97319 Non-pressure chronic ulcer of right ankle with unspecified severity: Secondary | ICD-10-CM | POA: Diagnosis not present

## 2022-04-01 DIAGNOSIS — I82501 Chronic embolism and thrombosis of unspecified deep veins of right lower extremity: Secondary | ICD-10-CM | POA: Diagnosis not present

## 2022-04-01 DIAGNOSIS — Z8719 Personal history of other diseases of the digestive system: Secondary | ICD-10-CM | POA: Diagnosis not present

## 2022-04-01 DIAGNOSIS — E669 Obesity, unspecified: Secondary | ICD-10-CM | POA: Diagnosis not present

## 2022-04-01 DIAGNOSIS — R531 Weakness: Secondary | ICD-10-CM | POA: Diagnosis not present

## 2022-04-01 DIAGNOSIS — K5900 Constipation, unspecified: Secondary | ICD-10-CM | POA: Diagnosis not present

## 2022-04-01 DIAGNOSIS — T148XXA Other injury of unspecified body region, initial encounter: Secondary | ICD-10-CM | POA: Diagnosis not present

## 2022-04-01 DIAGNOSIS — I82421 Acute embolism and thrombosis of right iliac vein: Secondary | ICD-10-CM | POA: Diagnosis not present

## 2022-04-01 DIAGNOSIS — L304 Erythema intertrigo: Secondary | ICD-10-CM | POA: Diagnosis not present

## 2022-04-01 DIAGNOSIS — Z7901 Long term (current) use of anticoagulants: Secondary | ICD-10-CM | POA: Diagnosis not present

## 2022-04-01 DIAGNOSIS — I87331 Chronic venous hypertension (idiopathic) with ulcer and inflammation of right lower extremity: Secondary | ICD-10-CM | POA: Diagnosis not present

## 2022-04-01 DIAGNOSIS — I82411 Acute embolism and thrombosis of right femoral vein: Secondary | ICD-10-CM | POA: Diagnosis not present

## 2022-04-01 DIAGNOSIS — M800AXD Age-related osteoporosis with current pathological fracture, other site, subsequent encounter for fracture with routine healing: Secondary | ICD-10-CM | POA: Diagnosis not present

## 2022-04-01 DIAGNOSIS — M160 Bilateral primary osteoarthritis of hip: Secondary | ICD-10-CM | POA: Diagnosis not present

## 2022-04-01 DIAGNOSIS — D500 Iron deficiency anemia secondary to blood loss (chronic): Secondary | ICD-10-CM | POA: Diagnosis not present

## 2022-04-01 DIAGNOSIS — I10 Essential (primary) hypertension: Secondary | ICD-10-CM | POA: Diagnosis not present

## 2022-04-01 DIAGNOSIS — I872 Venous insufficiency (chronic) (peripheral): Secondary | ICD-10-CM | POA: Diagnosis not present

## 2022-04-01 DIAGNOSIS — Z7189 Other specified counseling: Secondary | ICD-10-CM | POA: Diagnosis not present

## 2022-04-01 DIAGNOSIS — Z86718 Personal history of other venous thrombosis and embolism: Secondary | ICD-10-CM | POA: Diagnosis not present

## 2022-04-01 DIAGNOSIS — R5381 Other malaise: Secondary | ICD-10-CM | POA: Diagnosis not present

## 2022-04-01 DIAGNOSIS — L97812 Non-pressure chronic ulcer of other part of right lower leg with fat layer exposed: Secondary | ICD-10-CM | POA: Diagnosis not present

## 2022-04-01 DIAGNOSIS — R293 Abnormal posture: Secondary | ICD-10-CM | POA: Diagnosis not present

## 2022-04-01 DIAGNOSIS — I82511 Chronic embolism and thrombosis of right femoral vein: Secondary | ICD-10-CM | POA: Diagnosis not present

## 2022-04-01 DIAGNOSIS — J984 Other disorders of lung: Secondary | ICD-10-CM | POA: Diagnosis not present

## 2022-04-01 DIAGNOSIS — M6250 Muscle wasting and atrophy, not elsewhere classified, unspecified site: Secondary | ICD-10-CM | POA: Diagnosis not present

## 2022-04-01 DIAGNOSIS — G629 Polyneuropathy, unspecified: Secondary | ICD-10-CM | POA: Diagnosis not present

## 2022-04-01 DIAGNOSIS — E11622 Type 2 diabetes mellitus with other skin ulcer: Secondary | ICD-10-CM | POA: Diagnosis not present

## 2022-04-01 DIAGNOSIS — M1990 Unspecified osteoarthritis, unspecified site: Secondary | ICD-10-CM | POA: Diagnosis not present

## 2022-04-01 DIAGNOSIS — I87332 Chronic venous hypertension (idiopathic) with ulcer and inflammation of left lower extremity: Secondary | ICD-10-CM | POA: Diagnosis not present

## 2022-04-01 DIAGNOSIS — K921 Melena: Secondary | ICD-10-CM | POA: Diagnosis not present

## 2022-04-01 DIAGNOSIS — E119 Type 2 diabetes mellitus without complications: Secondary | ICD-10-CM | POA: Diagnosis not present

## 2022-04-01 DIAGNOSIS — N189 Chronic kidney disease, unspecified: Secondary | ICD-10-CM | POA: Diagnosis not present

## 2022-04-01 DIAGNOSIS — R278 Other lack of coordination: Secondary | ICD-10-CM | POA: Diagnosis not present

## 2022-04-01 DIAGNOSIS — M8008XD Age-related osteoporosis with current pathological fracture, vertebra(e), subsequent encounter for fracture with routine healing: Secondary | ICD-10-CM | POA: Diagnosis not present

## 2022-04-01 DIAGNOSIS — J189 Pneumonia, unspecified organism: Secondary | ICD-10-CM | POA: Diagnosis not present

## 2022-04-01 DIAGNOSIS — I82401 Acute embolism and thrombosis of unspecified deep veins of right lower extremity: Secondary | ICD-10-CM | POA: Diagnosis not present

## 2022-04-01 DIAGNOSIS — D649 Anemia, unspecified: Secondary | ICD-10-CM | POA: Diagnosis not present

## 2022-04-01 DIAGNOSIS — R918 Other nonspecific abnormal finding of lung field: Secondary | ICD-10-CM | POA: Diagnosis not present

## 2022-04-01 DIAGNOSIS — E1151 Type 2 diabetes mellitus with diabetic peripheral angiopathy without gangrene: Secondary | ICD-10-CM | POA: Diagnosis not present

## 2022-04-01 DIAGNOSIS — R9341 Abnormal radiologic findings on diagnostic imaging of renal pelvis, ureter, or bladder: Secondary | ICD-10-CM | POA: Diagnosis not present

## 2022-04-01 DIAGNOSIS — S81801A Unspecified open wound, right lower leg, initial encounter: Secondary | ICD-10-CM | POA: Diagnosis not present

## 2022-04-01 DIAGNOSIS — I739 Peripheral vascular disease, unspecified: Secondary | ICD-10-CM | POA: Diagnosis not present

## 2022-04-02 DIAGNOSIS — I87332 Chronic venous hypertension (idiopathic) with ulcer and inflammation of left lower extremity: Secondary | ICD-10-CM | POA: Diagnosis not present

## 2022-04-02 DIAGNOSIS — D649 Anemia, unspecified: Secondary | ICD-10-CM | POA: Diagnosis not present

## 2022-04-02 DIAGNOSIS — E119 Type 2 diabetes mellitus without complications: Secondary | ICD-10-CM | POA: Diagnosis not present

## 2022-04-02 DIAGNOSIS — S81801A Unspecified open wound, right lower leg, initial encounter: Secondary | ICD-10-CM | POA: Diagnosis not present

## 2022-04-02 DIAGNOSIS — I739 Peripheral vascular disease, unspecified: Secondary | ICD-10-CM | POA: Diagnosis not present

## 2022-04-02 DIAGNOSIS — N189 Chronic kidney disease, unspecified: Secondary | ICD-10-CM | POA: Diagnosis not present

## 2022-04-02 DIAGNOSIS — G629 Polyneuropathy, unspecified: Secondary | ICD-10-CM | POA: Diagnosis not present

## 2022-04-02 DIAGNOSIS — I82401 Acute embolism and thrombosis of unspecified deep veins of right lower extremity: Secondary | ICD-10-CM | POA: Diagnosis not present

## 2022-04-02 DIAGNOSIS — I10 Essential (primary) hypertension: Secondary | ICD-10-CM | POA: Diagnosis not present

## 2022-04-02 DIAGNOSIS — L97812 Non-pressure chronic ulcer of other part of right lower leg with fat layer exposed: Secondary | ICD-10-CM | POA: Diagnosis not present

## 2022-04-03 DIAGNOSIS — L97319 Non-pressure chronic ulcer of right ankle with unspecified severity: Secondary | ICD-10-CM | POA: Diagnosis not present

## 2022-04-03 DIAGNOSIS — I872 Venous insufficiency (chronic) (peripheral): Secondary | ICD-10-CM | POA: Diagnosis not present

## 2022-04-03 DIAGNOSIS — E1151 Type 2 diabetes mellitus with diabetic peripheral angiopathy without gangrene: Secondary | ICD-10-CM | POA: Diagnosis not present

## 2022-04-03 DIAGNOSIS — M8008XD Age-related osteoporosis with current pathological fracture, vertebra(e), subsequent encounter for fracture with routine healing: Secondary | ICD-10-CM | POA: Diagnosis not present

## 2022-04-03 DIAGNOSIS — I82421 Acute embolism and thrombosis of right iliac vein: Secondary | ICD-10-CM | POA: Diagnosis not present

## 2022-04-03 DIAGNOSIS — M800AXD Age-related osteoporosis with current pathological fracture, other site, subsequent encounter for fracture with routine healing: Secondary | ICD-10-CM | POA: Diagnosis not present

## 2022-04-04 DIAGNOSIS — K921 Melena: Secondary | ICD-10-CM | POA: Diagnosis not present

## 2022-04-04 DIAGNOSIS — S81801A Unspecified open wound, right lower leg, initial encounter: Secondary | ICD-10-CM | POA: Diagnosis not present

## 2022-04-04 DIAGNOSIS — R5381 Other malaise: Secondary | ICD-10-CM | POA: Diagnosis not present

## 2022-04-04 DIAGNOSIS — I82401 Acute embolism and thrombosis of unspecified deep veins of right lower extremity: Secondary | ICD-10-CM | POA: Diagnosis not present

## 2022-04-04 DIAGNOSIS — G629 Polyneuropathy, unspecified: Secondary | ICD-10-CM | POA: Diagnosis not present

## 2022-04-04 DIAGNOSIS — E669 Obesity, unspecified: Secondary | ICD-10-CM | POA: Diagnosis not present

## 2022-04-04 DIAGNOSIS — N189 Chronic kidney disease, unspecified: Secondary | ICD-10-CM | POA: Diagnosis not present

## 2022-04-07 DIAGNOSIS — E1151 Type 2 diabetes mellitus with diabetic peripheral angiopathy without gangrene: Secondary | ICD-10-CM | POA: Diagnosis not present

## 2022-04-07 DIAGNOSIS — D500 Iron deficiency anemia secondary to blood loss (chronic): Secondary | ICD-10-CM | POA: Diagnosis not present

## 2022-04-07 DIAGNOSIS — I82501 Chronic embolism and thrombosis of unspecified deep veins of right lower extremity: Secondary | ICD-10-CM | POA: Diagnosis not present

## 2022-04-07 DIAGNOSIS — R531 Weakness: Secondary | ICD-10-CM | POA: Diagnosis not present

## 2022-04-08 DIAGNOSIS — K5900 Constipation, unspecified: Secondary | ICD-10-CM | POA: Diagnosis not present

## 2022-04-08 DIAGNOSIS — J189 Pneumonia, unspecified organism: Secondary | ICD-10-CM | POA: Diagnosis not present

## 2022-04-09 DIAGNOSIS — I87331 Chronic venous hypertension (idiopathic) with ulcer and inflammation of right lower extremity: Secondary | ICD-10-CM | POA: Diagnosis not present

## 2022-04-09 DIAGNOSIS — L97812 Non-pressure chronic ulcer of other part of right lower leg with fat layer exposed: Secondary | ICD-10-CM | POA: Diagnosis not present

## 2022-04-10 DIAGNOSIS — D649 Anemia, unspecified: Secondary | ICD-10-CM | POA: Diagnosis not present

## 2022-04-10 DIAGNOSIS — R531 Weakness: Secondary | ICD-10-CM | POA: Diagnosis not present

## 2022-04-10 DIAGNOSIS — D500 Iron deficiency anemia secondary to blood loss (chronic): Secondary | ICD-10-CM | POA: Diagnosis not present

## 2022-04-10 DIAGNOSIS — N189 Chronic kidney disease, unspecified: Secondary | ICD-10-CM | POA: Diagnosis not present

## 2022-04-10 DIAGNOSIS — E1151 Type 2 diabetes mellitus with diabetic peripheral angiopathy without gangrene: Secondary | ICD-10-CM | POA: Diagnosis not present

## 2022-04-10 DIAGNOSIS — J189 Pneumonia, unspecified organism: Secondary | ICD-10-CM | POA: Diagnosis not present

## 2022-04-10 DIAGNOSIS — I82501 Chronic embolism and thrombosis of unspecified deep veins of right lower extremity: Secondary | ICD-10-CM | POA: Diagnosis not present

## 2022-04-14 DIAGNOSIS — E1151 Type 2 diabetes mellitus with diabetic peripheral angiopathy without gangrene: Secondary | ICD-10-CM | POA: Diagnosis not present

## 2022-04-14 DIAGNOSIS — J189 Pneumonia, unspecified organism: Secondary | ICD-10-CM | POA: Diagnosis not present

## 2022-04-14 DIAGNOSIS — I10 Essential (primary) hypertension: Secondary | ICD-10-CM | POA: Diagnosis not present

## 2022-04-14 DIAGNOSIS — L304 Erythema intertrigo: Secondary | ICD-10-CM | POA: Diagnosis not present

## 2022-04-14 DIAGNOSIS — I82501 Chronic embolism and thrombosis of unspecified deep veins of right lower extremity: Secondary | ICD-10-CM | POA: Diagnosis not present

## 2022-04-14 DIAGNOSIS — R531 Weakness: Secondary | ICD-10-CM | POA: Diagnosis not present

## 2022-04-14 DIAGNOSIS — M1990 Unspecified osteoarthritis, unspecified site: Secondary | ICD-10-CM | POA: Diagnosis not present

## 2022-04-14 DIAGNOSIS — D500 Iron deficiency anemia secondary to blood loss (chronic): Secondary | ICD-10-CM | POA: Diagnosis not present

## 2022-04-17 DIAGNOSIS — Z8719 Personal history of other diseases of the digestive system: Secondary | ICD-10-CM | POA: Diagnosis not present

## 2022-04-17 DIAGNOSIS — Z86718 Personal history of other venous thrombosis and embolism: Secondary | ICD-10-CM | POA: Diagnosis not present

## 2022-04-17 DIAGNOSIS — D500 Iron deficiency anemia secondary to blood loss (chronic): Secondary | ICD-10-CM | POA: Diagnosis not present

## 2022-04-17 DIAGNOSIS — R9341 Abnormal radiologic findings on diagnostic imaging of renal pelvis, ureter, or bladder: Secondary | ICD-10-CM | POA: Diagnosis not present

## 2022-04-17 DIAGNOSIS — I739 Peripheral vascular disease, unspecified: Secondary | ICD-10-CM | POA: Diagnosis not present

## 2022-04-17 DIAGNOSIS — I82411 Acute embolism and thrombosis of right femoral vein: Secondary | ICD-10-CM | POA: Diagnosis not present

## 2022-04-17 DIAGNOSIS — T148XXA Other injury of unspecified body region, initial encounter: Secondary | ICD-10-CM | POA: Diagnosis not present

## 2022-04-17 DIAGNOSIS — Z7189 Other specified counseling: Secondary | ICD-10-CM | POA: Diagnosis not present

## 2022-04-17 DIAGNOSIS — I10 Essential (primary) hypertension: Secondary | ICD-10-CM | POA: Diagnosis not present

## 2022-04-18 DIAGNOSIS — G629 Polyneuropathy, unspecified: Secondary | ICD-10-CM | POA: Diagnosis not present

## 2022-04-18 DIAGNOSIS — I10 Essential (primary) hypertension: Secondary | ICD-10-CM | POA: Diagnosis not present

## 2022-04-21 DIAGNOSIS — I82501 Chronic embolism and thrombosis of unspecified deep veins of right lower extremity: Secondary | ICD-10-CM | POA: Diagnosis not present

## 2022-04-21 DIAGNOSIS — D500 Iron deficiency anemia secondary to blood loss (chronic): Secondary | ICD-10-CM | POA: Diagnosis not present

## 2022-04-21 DIAGNOSIS — R531 Weakness: Secondary | ICD-10-CM | POA: Diagnosis not present

## 2022-04-21 DIAGNOSIS — E1151 Type 2 diabetes mellitus with diabetic peripheral angiopathy without gangrene: Secondary | ICD-10-CM | POA: Diagnosis not present

## 2022-04-22 ENCOUNTER — Telehealth: Payer: Self-pay | Admitting: Family Medicine

## 2022-04-22 DIAGNOSIS — J189 Pneumonia, unspecified organism: Secondary | ICD-10-CM | POA: Diagnosis not present

## 2022-04-22 NOTE — Telephone Encounter (Signed)
Please remove me as PCP

## 2022-04-22 NOTE — Telephone Encounter (Signed)
Pt daughter called to cancel any future appointments here, she says she is now in a nursing home and they are caring for her there.

## 2022-04-24 ENCOUNTER — Other Ambulatory Visit: Payer: Medicare PPO

## 2022-04-24 DIAGNOSIS — I82501 Chronic embolism and thrombosis of unspecified deep veins of right lower extremity: Secondary | ICD-10-CM | POA: Diagnosis not present

## 2022-04-24 DIAGNOSIS — R531 Weakness: Secondary | ICD-10-CM | POA: Diagnosis not present

## 2022-04-24 DIAGNOSIS — E1151 Type 2 diabetes mellitus with diabetic peripheral angiopathy without gangrene: Secondary | ICD-10-CM | POA: Diagnosis not present

## 2022-04-24 DIAGNOSIS — D500 Iron deficiency anemia secondary to blood loss (chronic): Secondary | ICD-10-CM | POA: Diagnosis not present

## 2022-04-28 DIAGNOSIS — S81801A Unspecified open wound, right lower leg, initial encounter: Secondary | ICD-10-CM | POA: Diagnosis not present

## 2022-04-28 DIAGNOSIS — E119 Type 2 diabetes mellitus without complications: Secondary | ICD-10-CM | POA: Diagnosis not present

## 2022-04-28 DIAGNOSIS — M1990 Unspecified osteoarthritis, unspecified site: Secondary | ICD-10-CM | POA: Diagnosis not present

## 2022-04-28 DIAGNOSIS — N189 Chronic kidney disease, unspecified: Secondary | ICD-10-CM | POA: Diagnosis not present

## 2022-04-28 DIAGNOSIS — I739 Peripheral vascular disease, unspecified: Secondary | ICD-10-CM | POA: Diagnosis not present

## 2022-04-28 DIAGNOSIS — I10 Essential (primary) hypertension: Secondary | ICD-10-CM | POA: Diagnosis not present

## 2022-04-28 DIAGNOSIS — D649 Anemia, unspecified: Secondary | ICD-10-CM | POA: Diagnosis not present

## 2022-04-28 DIAGNOSIS — I82401 Acute embolism and thrombosis of unspecified deep veins of right lower extremity: Secondary | ICD-10-CM | POA: Diagnosis not present

## 2022-05-01 DIAGNOSIS — Z8701 Personal history of pneumonia (recurrent): Secondary | ICD-10-CM | POA: Diagnosis not present

## 2022-05-01 DIAGNOSIS — I739 Peripheral vascular disease, unspecified: Secondary | ICD-10-CM | POA: Diagnosis not present

## 2022-05-01 DIAGNOSIS — I82401 Acute embolism and thrombosis of unspecified deep veins of right lower extremity: Secondary | ICD-10-CM | POA: Diagnosis not present

## 2022-05-01 DIAGNOSIS — J90 Pleural effusion, not elsewhere classified: Secondary | ICD-10-CM | POA: Diagnosis not present

## 2022-05-01 DIAGNOSIS — R918 Other nonspecific abnormal finding of lung field: Secondary | ICD-10-CM | POA: Diagnosis not present

## 2022-05-01 DIAGNOSIS — I10 Essential (primary) hypertension: Secondary | ICD-10-CM | POA: Diagnosis not present

## 2022-05-01 DIAGNOSIS — D509 Iron deficiency anemia, unspecified: Secondary | ICD-10-CM | POA: Diagnosis not present

## 2022-05-01 DIAGNOSIS — E118 Type 2 diabetes mellitus with unspecified complications: Secondary | ICD-10-CM | POA: Diagnosis not present

## 2022-05-04 ENCOUNTER — Other Ambulatory Visit: Payer: Self-pay | Admitting: Family Medicine

## 2022-05-04 DIAGNOSIS — I10 Essential (primary) hypertension: Secondary | ICD-10-CM

## 2022-05-05 NOTE — Telephone Encounter (Signed)
If she is no longer our patient, we don't prescribe her  meds. (If the patient calls and states she needs more to last until appt with new doc, then okay for small supply--I have no idea if this is the case or not, but if they request that, is okay).  Decline this, advise we are no longer PCP

## 2022-05-05 NOTE — Telephone Encounter (Signed)
Is this ok to refill? I see she is no longer a pt here

## 2022-05-07 DIAGNOSIS — N390 Urinary tract infection, site not specified: Secondary | ICD-10-CM | POA: Diagnosis not present

## 2022-05-07 DIAGNOSIS — R2681 Unsteadiness on feet: Secondary | ICD-10-CM | POA: Diagnosis not present

## 2022-05-07 DIAGNOSIS — M6281 Muscle weakness (generalized): Secondary | ICD-10-CM | POA: Diagnosis not present

## 2022-05-08 DIAGNOSIS — M6281 Muscle weakness (generalized): Secondary | ICD-10-CM | POA: Diagnosis not present

## 2022-05-08 DIAGNOSIS — R2681 Unsteadiness on feet: Secondary | ICD-10-CM | POA: Diagnosis not present

## 2022-05-09 DIAGNOSIS — R2681 Unsteadiness on feet: Secondary | ICD-10-CM | POA: Diagnosis not present

## 2022-05-09 DIAGNOSIS — M6281 Muscle weakness (generalized): Secondary | ICD-10-CM | POA: Diagnosis not present

## 2022-05-14 DIAGNOSIS — M6281 Muscle weakness (generalized): Secondary | ICD-10-CM | POA: Diagnosis not present

## 2022-05-14 DIAGNOSIS — R2681 Unsteadiness on feet: Secondary | ICD-10-CM | POA: Diagnosis not present

## 2022-05-15 DIAGNOSIS — Z5181 Encounter for therapeutic drug level monitoring: Secondary | ICD-10-CM | POA: Diagnosis not present

## 2022-05-15 DIAGNOSIS — Z7189 Other specified counseling: Secondary | ICD-10-CM | POA: Diagnosis not present

## 2022-05-15 DIAGNOSIS — M6281 Muscle weakness (generalized): Secondary | ICD-10-CM | POA: Diagnosis not present

## 2022-05-15 DIAGNOSIS — R918 Other nonspecific abnormal finding of lung field: Secondary | ICD-10-CM | POA: Diagnosis not present

## 2022-05-15 DIAGNOSIS — R9341 Abnormal radiologic findings on diagnostic imaging of renal pelvis, ureter, or bladder: Secondary | ICD-10-CM | POA: Diagnosis not present

## 2022-05-15 DIAGNOSIS — D500 Iron deficiency anemia secondary to blood loss (chronic): Secondary | ICD-10-CM | POA: Diagnosis not present

## 2022-05-15 DIAGNOSIS — Z7901 Long term (current) use of anticoagulants: Secondary | ICD-10-CM | POA: Diagnosis not present

## 2022-05-15 DIAGNOSIS — J9811 Atelectasis: Secondary | ICD-10-CM | POA: Diagnosis not present

## 2022-05-15 DIAGNOSIS — R2681 Unsteadiness on feet: Secondary | ICD-10-CM | POA: Diagnosis not present

## 2022-05-15 DIAGNOSIS — N3289 Other specified disorders of bladder: Secondary | ICD-10-CM | POA: Diagnosis not present

## 2022-05-15 DIAGNOSIS — K769 Liver disease, unspecified: Secondary | ICD-10-CM | POA: Diagnosis not present

## 2022-05-15 DIAGNOSIS — Z86718 Personal history of other venous thrombosis and embolism: Secondary | ICD-10-CM | POA: Diagnosis not present

## 2022-05-15 DIAGNOSIS — J90 Pleural effusion, not elsewhere classified: Secondary | ICD-10-CM | POA: Diagnosis not present

## 2022-05-16 DIAGNOSIS — M6281 Muscle weakness (generalized): Secondary | ICD-10-CM | POA: Diagnosis not present

## 2022-05-16 DIAGNOSIS — R2681 Unsteadiness on feet: Secondary | ICD-10-CM | POA: Diagnosis not present

## 2022-05-19 DIAGNOSIS — R2681 Unsteadiness on feet: Secondary | ICD-10-CM | POA: Diagnosis not present

## 2022-05-19 DIAGNOSIS — M6281 Muscle weakness (generalized): Secondary | ICD-10-CM | POA: Diagnosis not present

## 2022-05-20 DIAGNOSIS — R2681 Unsteadiness on feet: Secondary | ICD-10-CM | POA: Diagnosis not present

## 2022-05-20 DIAGNOSIS — M6281 Muscle weakness (generalized): Secondary | ICD-10-CM | POA: Diagnosis not present

## 2022-05-21 DIAGNOSIS — I87001 Postthrombotic syndrome without complications of right lower extremity: Secondary | ICD-10-CM | POA: Diagnosis not present

## 2022-05-21 DIAGNOSIS — I739 Peripheral vascular disease, unspecified: Secondary | ICD-10-CM | POA: Diagnosis not present

## 2022-05-21 DIAGNOSIS — M6281 Muscle weakness (generalized): Secondary | ICD-10-CM | POA: Diagnosis not present

## 2022-05-21 DIAGNOSIS — R2681 Unsteadiness on feet: Secondary | ICD-10-CM | POA: Diagnosis not present

## 2022-05-22 DIAGNOSIS — R319 Hematuria, unspecified: Secondary | ICD-10-CM | POA: Diagnosis not present

## 2022-05-22 DIAGNOSIS — D509 Iron deficiency anemia, unspecified: Secondary | ICD-10-CM | POA: Diagnosis not present

## 2022-05-22 DIAGNOSIS — M6281 Muscle weakness (generalized): Secondary | ICD-10-CM | POA: Diagnosis not present

## 2022-05-22 DIAGNOSIS — R2681 Unsteadiness on feet: Secondary | ICD-10-CM | POA: Diagnosis not present

## 2022-05-22 DIAGNOSIS — I82401 Acute embolism and thrombosis of unspecified deep veins of right lower extremity: Secondary | ICD-10-CM | POA: Diagnosis not present

## 2022-05-22 DIAGNOSIS — Z8701 Personal history of pneumonia (recurrent): Secondary | ICD-10-CM | POA: Diagnosis not present

## 2022-05-22 DIAGNOSIS — I739 Peripheral vascular disease, unspecified: Secondary | ICD-10-CM | POA: Diagnosis not present

## 2022-05-22 DIAGNOSIS — I10 Essential (primary) hypertension: Secondary | ICD-10-CM | POA: Diagnosis not present

## 2022-05-22 DIAGNOSIS — E118 Type 2 diabetes mellitus with unspecified complications: Secondary | ICD-10-CM | POA: Diagnosis not present

## 2022-05-23 DIAGNOSIS — I10 Essential (primary) hypertension: Secondary | ICD-10-CM | POA: Diagnosis not present

## 2022-05-23 DIAGNOSIS — M6281 Muscle weakness (generalized): Secondary | ICD-10-CM | POA: Diagnosis not present

## 2022-05-23 DIAGNOSIS — R2681 Unsteadiness on feet: Secondary | ICD-10-CM | POA: Diagnosis not present

## 2022-05-23 DIAGNOSIS — E1140 Type 2 diabetes mellitus with diabetic neuropathy, unspecified: Secondary | ICD-10-CM | POA: Diagnosis not present

## 2022-05-23 DIAGNOSIS — E119 Type 2 diabetes mellitus without complications: Secondary | ICD-10-CM | POA: Diagnosis not present

## 2022-05-23 DIAGNOSIS — D649 Anemia, unspecified: Secondary | ICD-10-CM | POA: Diagnosis not present

## 2022-05-26 DIAGNOSIS — M6281 Muscle weakness (generalized): Secondary | ICD-10-CM | POA: Diagnosis not present

## 2022-05-26 DIAGNOSIS — R2681 Unsteadiness on feet: Secondary | ICD-10-CM | POA: Diagnosis not present

## 2022-05-27 DIAGNOSIS — M6281 Muscle weakness (generalized): Secondary | ICD-10-CM | POA: Diagnosis not present

## 2022-05-27 DIAGNOSIS — R2681 Unsteadiness on feet: Secondary | ICD-10-CM | POA: Diagnosis not present

## 2022-05-28 DIAGNOSIS — M6281 Muscle weakness (generalized): Secondary | ICD-10-CM | POA: Diagnosis not present

## 2022-05-28 DIAGNOSIS — R2681 Unsteadiness on feet: Secondary | ICD-10-CM | POA: Diagnosis not present

## 2022-05-29 DIAGNOSIS — R2681 Unsteadiness on feet: Secondary | ICD-10-CM | POA: Diagnosis not present

## 2022-05-29 DIAGNOSIS — M6281 Muscle weakness (generalized): Secondary | ICD-10-CM | POA: Diagnosis not present

## 2022-05-30 DIAGNOSIS — D509 Iron deficiency anemia, unspecified: Secondary | ICD-10-CM | POA: Diagnosis not present

## 2022-05-30 DIAGNOSIS — R2681 Unsteadiness on feet: Secondary | ICD-10-CM | POA: Diagnosis not present

## 2022-05-30 DIAGNOSIS — M6281 Muscle weakness (generalized): Secondary | ICD-10-CM | POA: Diagnosis not present

## 2022-05-30 DIAGNOSIS — I10 Essential (primary) hypertension: Secondary | ICD-10-CM | POA: Diagnosis not present

## 2022-05-31 DIAGNOSIS — M6281 Muscle weakness (generalized): Secondary | ICD-10-CM | POA: Diagnosis not present

## 2022-05-31 DIAGNOSIS — R2681 Unsteadiness on feet: Secondary | ICD-10-CM | POA: Diagnosis not present

## 2022-06-01 DIAGNOSIS — N390 Urinary tract infection, site not specified: Secondary | ICD-10-CM | POA: Diagnosis not present

## 2022-06-02 DIAGNOSIS — E118 Type 2 diabetes mellitus with unspecified complications: Secondary | ICD-10-CM | POA: Diagnosis not present

## 2022-06-02 DIAGNOSIS — I739 Peripheral vascular disease, unspecified: Secondary | ICD-10-CM | POA: Diagnosis not present

## 2022-06-02 DIAGNOSIS — I82401 Acute embolism and thrombosis of unspecified deep veins of right lower extremity: Secondary | ICD-10-CM | POA: Diagnosis not present

## 2022-06-02 DIAGNOSIS — Z8701 Personal history of pneumonia (recurrent): Secondary | ICD-10-CM | POA: Diagnosis not present

## 2022-06-02 DIAGNOSIS — I10 Essential (primary) hypertension: Secondary | ICD-10-CM | POA: Diagnosis not present

## 2022-06-02 DIAGNOSIS — D509 Iron deficiency anemia, unspecified: Secondary | ICD-10-CM | POA: Diagnosis not present

## 2022-06-02 DIAGNOSIS — R319 Hematuria, unspecified: Secondary | ICD-10-CM | POA: Diagnosis not present

## 2022-06-03 DIAGNOSIS — M6281 Muscle weakness (generalized): Secondary | ICD-10-CM | POA: Diagnosis not present

## 2022-06-03 DIAGNOSIS — R2681 Unsteadiness on feet: Secondary | ICD-10-CM | POA: Diagnosis not present

## 2022-06-04 DIAGNOSIS — M6281 Muscle weakness (generalized): Secondary | ICD-10-CM | POA: Diagnosis not present

## 2022-06-04 DIAGNOSIS — R2681 Unsteadiness on feet: Secondary | ICD-10-CM | POA: Diagnosis not present

## 2022-06-05 DIAGNOSIS — I10 Essential (primary) hypertension: Secondary | ICD-10-CM | POA: Diagnosis not present

## 2022-06-05 DIAGNOSIS — R2681 Unsteadiness on feet: Secondary | ICD-10-CM | POA: Diagnosis not present

## 2022-06-05 DIAGNOSIS — D509 Iron deficiency anemia, unspecified: Secondary | ICD-10-CM | POA: Diagnosis not present

## 2022-06-05 DIAGNOSIS — M6281 Muscle weakness (generalized): Secondary | ICD-10-CM | POA: Diagnosis not present

## 2022-06-06 DIAGNOSIS — M6281 Muscle weakness (generalized): Secondary | ICD-10-CM | POA: Diagnosis not present

## 2022-06-06 DIAGNOSIS — R2681 Unsteadiness on feet: Secondary | ICD-10-CM | POA: Diagnosis not present

## 2022-06-09 DIAGNOSIS — R2681 Unsteadiness on feet: Secondary | ICD-10-CM | POA: Diagnosis not present

## 2022-06-09 DIAGNOSIS — M6281 Muscle weakness (generalized): Secondary | ICD-10-CM | POA: Diagnosis not present

## 2022-06-10 DIAGNOSIS — M6281 Muscle weakness (generalized): Secondary | ICD-10-CM | POA: Diagnosis not present

## 2022-06-10 DIAGNOSIS — R2681 Unsteadiness on feet: Secondary | ICD-10-CM | POA: Diagnosis not present

## 2022-06-11 DIAGNOSIS — R2681 Unsteadiness on feet: Secondary | ICD-10-CM | POA: Diagnosis not present

## 2022-06-11 DIAGNOSIS — M6281 Muscle weakness (generalized): Secondary | ICD-10-CM | POA: Diagnosis not present

## 2022-06-11 DIAGNOSIS — N390 Urinary tract infection, site not specified: Secondary | ICD-10-CM | POA: Diagnosis not present

## 2022-06-13 DIAGNOSIS — R2681 Unsteadiness on feet: Secondary | ICD-10-CM | POA: Diagnosis not present

## 2022-06-13 DIAGNOSIS — M6281 Muscle weakness (generalized): Secondary | ICD-10-CM | POA: Diagnosis not present

## 2022-06-16 DIAGNOSIS — R2681 Unsteadiness on feet: Secondary | ICD-10-CM | POA: Diagnosis not present

## 2022-06-16 DIAGNOSIS — R32 Unspecified urinary incontinence: Secondary | ICD-10-CM | POA: Diagnosis not present

## 2022-06-16 DIAGNOSIS — M6250 Muscle wasting and atrophy, not elsewhere classified, unspecified site: Secondary | ICD-10-CM | POA: Diagnosis not present

## 2022-06-16 DIAGNOSIS — M6281 Muscle weakness (generalized): Secondary | ICD-10-CM | POA: Diagnosis not present

## 2022-06-18 DIAGNOSIS — D500 Iron deficiency anemia secondary to blood loss (chronic): Secondary | ICD-10-CM | POA: Diagnosis not present

## 2022-06-18 DIAGNOSIS — Z1212 Encounter for screening for malignant neoplasm of rectum: Secondary | ICD-10-CM | POA: Diagnosis not present

## 2022-06-18 DIAGNOSIS — R195 Other fecal abnormalities: Secondary | ICD-10-CM | POA: Diagnosis not present

## 2022-06-19 DIAGNOSIS — Z23 Encounter for immunization: Secondary | ICD-10-CM | POA: Diagnosis not present

## 2022-06-20 DIAGNOSIS — M6281 Muscle weakness (generalized): Secondary | ICD-10-CM | POA: Diagnosis not present

## 2022-06-20 DIAGNOSIS — M6250 Muscle wasting and atrophy, not elsewhere classified, unspecified site: Secondary | ICD-10-CM | POA: Diagnosis not present

## 2022-06-20 DIAGNOSIS — R2681 Unsteadiness on feet: Secondary | ICD-10-CM | POA: Diagnosis not present

## 2022-06-20 DIAGNOSIS — R32 Unspecified urinary incontinence: Secondary | ICD-10-CM | POA: Diagnosis not present

## 2022-06-23 DIAGNOSIS — M6281 Muscle weakness (generalized): Secondary | ICD-10-CM | POA: Diagnosis not present

## 2022-06-23 DIAGNOSIS — R32 Unspecified urinary incontinence: Secondary | ICD-10-CM | POA: Diagnosis not present

## 2022-06-23 DIAGNOSIS — R2681 Unsteadiness on feet: Secondary | ICD-10-CM | POA: Diagnosis not present

## 2022-06-23 DIAGNOSIS — M6250 Muscle wasting and atrophy, not elsewhere classified, unspecified site: Secondary | ICD-10-CM | POA: Diagnosis not present

## 2022-06-24 DIAGNOSIS — D500 Iron deficiency anemia secondary to blood loss (chronic): Secondary | ICD-10-CM | POA: Diagnosis not present

## 2022-06-24 DIAGNOSIS — N390 Urinary tract infection, site not specified: Secondary | ICD-10-CM | POA: Diagnosis not present

## 2022-06-25 DIAGNOSIS — D509 Iron deficiency anemia, unspecified: Secondary | ICD-10-CM | POA: Diagnosis not present

## 2022-06-25 DIAGNOSIS — R319 Hematuria, unspecified: Secondary | ICD-10-CM | POA: Diagnosis not present

## 2022-06-25 DIAGNOSIS — I82401 Acute embolism and thrombosis of unspecified deep veins of right lower extremity: Secondary | ICD-10-CM | POA: Diagnosis not present

## 2022-06-25 DIAGNOSIS — Z8701 Personal history of pneumonia (recurrent): Secondary | ICD-10-CM | POA: Diagnosis not present

## 2022-06-25 DIAGNOSIS — N399 Disorder of urinary system, unspecified: Secondary | ICD-10-CM | POA: Diagnosis not present

## 2022-06-25 DIAGNOSIS — E118 Type 2 diabetes mellitus with unspecified complications: Secondary | ICD-10-CM | POA: Diagnosis not present

## 2022-06-25 DIAGNOSIS — I739 Peripheral vascular disease, unspecified: Secondary | ICD-10-CM | POA: Diagnosis not present

## 2022-06-25 DIAGNOSIS — I10 Essential (primary) hypertension: Secondary | ICD-10-CM | POA: Diagnosis not present

## 2022-06-26 DIAGNOSIS — R2681 Unsteadiness on feet: Secondary | ICD-10-CM | POA: Diagnosis not present

## 2022-06-26 DIAGNOSIS — N390 Urinary tract infection, site not specified: Secondary | ICD-10-CM | POA: Diagnosis not present

## 2022-06-26 DIAGNOSIS — R32 Unspecified urinary incontinence: Secondary | ICD-10-CM | POA: Diagnosis not present

## 2022-06-26 DIAGNOSIS — M6281 Muscle weakness (generalized): Secondary | ICD-10-CM | POA: Diagnosis not present

## 2022-06-26 DIAGNOSIS — M6250 Muscle wasting and atrophy, not elsewhere classified, unspecified site: Secondary | ICD-10-CM | POA: Diagnosis not present

## 2022-06-29 DIAGNOSIS — E118 Type 2 diabetes mellitus with unspecified complications: Secondary | ICD-10-CM | POA: Diagnosis not present

## 2022-06-29 DIAGNOSIS — R319 Hematuria, unspecified: Secondary | ICD-10-CM | POA: Diagnosis not present

## 2022-06-29 DIAGNOSIS — D509 Iron deficiency anemia, unspecified: Secondary | ICD-10-CM | POA: Diagnosis not present

## 2022-06-29 DIAGNOSIS — I82401 Acute embolism and thrombosis of unspecified deep veins of right lower extremity: Secondary | ICD-10-CM | POA: Diagnosis not present

## 2022-06-29 DIAGNOSIS — N399 Disorder of urinary system, unspecified: Secondary | ICD-10-CM | POA: Diagnosis not present

## 2022-06-29 DIAGNOSIS — I10 Essential (primary) hypertension: Secondary | ICD-10-CM | POA: Diagnosis not present

## 2022-06-29 DIAGNOSIS — I739 Peripheral vascular disease, unspecified: Secondary | ICD-10-CM | POA: Diagnosis not present

## 2022-06-29 DIAGNOSIS — Z8701 Personal history of pneumonia (recurrent): Secondary | ICD-10-CM | POA: Diagnosis not present

## 2022-06-30 ENCOUNTER — Ambulatory Visit: Payer: Medicare PPO | Admitting: Family Medicine

## 2022-06-30 DIAGNOSIS — R2681 Unsteadiness on feet: Secondary | ICD-10-CM | POA: Diagnosis not present

## 2022-06-30 DIAGNOSIS — R32 Unspecified urinary incontinence: Secondary | ICD-10-CM | POA: Diagnosis not present

## 2022-06-30 DIAGNOSIS — M6281 Muscle weakness (generalized): Secondary | ICD-10-CM | POA: Diagnosis not present

## 2022-06-30 DIAGNOSIS — M6250 Muscle wasting and atrophy, not elsewhere classified, unspecified site: Secondary | ICD-10-CM | POA: Diagnosis not present

## 2022-07-01 DIAGNOSIS — R2681 Unsteadiness on feet: Secondary | ICD-10-CM | POA: Diagnosis not present

## 2022-07-01 DIAGNOSIS — R32 Unspecified urinary incontinence: Secondary | ICD-10-CM | POA: Diagnosis not present

## 2022-07-01 DIAGNOSIS — M6250 Muscle wasting and atrophy, not elsewhere classified, unspecified site: Secondary | ICD-10-CM | POA: Diagnosis not present

## 2022-07-01 DIAGNOSIS — M6281 Muscle weakness (generalized): Secondary | ICD-10-CM | POA: Diagnosis not present

## 2022-07-02 DIAGNOSIS — N390 Urinary tract infection, site not specified: Secondary | ICD-10-CM | POA: Diagnosis not present

## 2022-07-03 DIAGNOSIS — I82401 Acute embolism and thrombosis of unspecified deep veins of right lower extremity: Secondary | ICD-10-CM | POA: Diagnosis not present

## 2022-07-03 DIAGNOSIS — D509 Iron deficiency anemia, unspecified: Secondary | ICD-10-CM | POA: Diagnosis not present

## 2022-07-03 DIAGNOSIS — I739 Peripheral vascular disease, unspecified: Secondary | ICD-10-CM | POA: Diagnosis not present

## 2022-07-03 DIAGNOSIS — R319 Hematuria, unspecified: Secondary | ICD-10-CM | POA: Diagnosis not present

## 2022-07-03 DIAGNOSIS — E118 Type 2 diabetes mellitus with unspecified complications: Secondary | ICD-10-CM | POA: Diagnosis not present

## 2022-07-03 DIAGNOSIS — I10 Essential (primary) hypertension: Secondary | ICD-10-CM | POA: Diagnosis not present

## 2022-07-03 DIAGNOSIS — R2681 Unsteadiness on feet: Secondary | ICD-10-CM | POA: Diagnosis not present

## 2022-07-03 DIAGNOSIS — M6281 Muscle weakness (generalized): Secondary | ICD-10-CM | POA: Diagnosis not present

## 2022-07-03 DIAGNOSIS — Z8701 Personal history of pneumonia (recurrent): Secondary | ICD-10-CM | POA: Diagnosis not present

## 2022-07-03 DIAGNOSIS — N399 Disorder of urinary system, unspecified: Secondary | ICD-10-CM | POA: Diagnosis not present

## 2022-07-03 DIAGNOSIS — R32 Unspecified urinary incontinence: Secondary | ICD-10-CM | POA: Diagnosis not present

## 2022-07-03 DIAGNOSIS — M6250 Muscle wasting and atrophy, not elsewhere classified, unspecified site: Secondary | ICD-10-CM | POA: Diagnosis not present

## 2022-07-05 DIAGNOSIS — M6250 Muscle wasting and atrophy, not elsewhere classified, unspecified site: Secondary | ICD-10-CM | POA: Diagnosis not present

## 2022-07-05 DIAGNOSIS — R32 Unspecified urinary incontinence: Secondary | ICD-10-CM | POA: Diagnosis not present

## 2022-07-05 DIAGNOSIS — R2681 Unsteadiness on feet: Secondary | ICD-10-CM | POA: Diagnosis not present

## 2022-07-05 DIAGNOSIS — M6281 Muscle weakness (generalized): Secondary | ICD-10-CM | POA: Diagnosis not present

## 2022-07-07 DIAGNOSIS — R2681 Unsteadiness on feet: Secondary | ICD-10-CM | POA: Diagnosis not present

## 2022-07-07 DIAGNOSIS — R32 Unspecified urinary incontinence: Secondary | ICD-10-CM | POA: Diagnosis not present

## 2022-07-07 DIAGNOSIS — M6250 Muscle wasting and atrophy, not elsewhere classified, unspecified site: Secondary | ICD-10-CM | POA: Diagnosis not present

## 2022-07-07 DIAGNOSIS — M6281 Muscle weakness (generalized): Secondary | ICD-10-CM | POA: Diagnosis not present

## 2022-07-08 DIAGNOSIS — M6281 Muscle weakness (generalized): Secondary | ICD-10-CM | POA: Diagnosis not present

## 2022-07-08 DIAGNOSIS — R32 Unspecified urinary incontinence: Secondary | ICD-10-CM | POA: Diagnosis not present

## 2022-07-08 DIAGNOSIS — R2681 Unsteadiness on feet: Secondary | ICD-10-CM | POA: Diagnosis not present

## 2022-07-08 DIAGNOSIS — M6250 Muscle wasting and atrophy, not elsewhere classified, unspecified site: Secondary | ICD-10-CM | POA: Diagnosis not present

## 2022-07-09 DIAGNOSIS — R32 Unspecified urinary incontinence: Secondary | ICD-10-CM | POA: Diagnosis not present

## 2022-07-09 DIAGNOSIS — R2681 Unsteadiness on feet: Secondary | ICD-10-CM | POA: Diagnosis not present

## 2022-07-09 DIAGNOSIS — M6250 Muscle wasting and atrophy, not elsewhere classified, unspecified site: Secondary | ICD-10-CM | POA: Diagnosis not present

## 2022-07-09 DIAGNOSIS — N3944 Nocturnal enuresis: Secondary | ICD-10-CM | POA: Diagnosis not present

## 2022-07-09 DIAGNOSIS — M6281 Muscle weakness (generalized): Secondary | ICD-10-CM | POA: Diagnosis not present

## 2022-07-09 DIAGNOSIS — Z87442 Personal history of urinary calculi: Secondary | ICD-10-CM | POA: Diagnosis not present

## 2022-07-10 ENCOUNTER — Other Ambulatory Visit: Payer: Self-pay | Admitting: Unknown Physician Specialty

## 2022-07-10 DIAGNOSIS — N281 Cyst of kidney, acquired: Secondary | ICD-10-CM | POA: Diagnosis not present

## 2022-07-10 DIAGNOSIS — K439 Ventral hernia without obstruction or gangrene: Secondary | ICD-10-CM | POA: Diagnosis not present

## 2022-07-10 DIAGNOSIS — D509 Iron deficiency anemia, unspecified: Secondary | ICD-10-CM

## 2022-07-10 DIAGNOSIS — K769 Liver disease, unspecified: Secondary | ICD-10-CM | POA: Diagnosis not present

## 2022-07-10 DIAGNOSIS — N200 Calculus of kidney: Secondary | ICD-10-CM | POA: Diagnosis not present

## 2022-07-12 DIAGNOSIS — M6250 Muscle wasting and atrophy, not elsewhere classified, unspecified site: Secondary | ICD-10-CM | POA: Diagnosis not present

## 2022-07-12 DIAGNOSIS — R32 Unspecified urinary incontinence: Secondary | ICD-10-CM | POA: Diagnosis not present

## 2022-07-12 DIAGNOSIS — M6281 Muscle weakness (generalized): Secondary | ICD-10-CM | POA: Diagnosis not present

## 2022-07-12 DIAGNOSIS — R2681 Unsteadiness on feet: Secondary | ICD-10-CM | POA: Diagnosis not present

## 2022-07-14 DIAGNOSIS — Z20828 Contact with and (suspected) exposure to other viral communicable diseases: Secondary | ICD-10-CM | POA: Diagnosis not present

## 2022-07-15 ENCOUNTER — Telehealth: Payer: Self-pay | Admitting: *Deleted

## 2022-07-15 ENCOUNTER — Telehealth: Payer: Self-pay

## 2022-07-15 DIAGNOSIS — M6250 Muscle wasting and atrophy, not elsewhere classified, unspecified site: Secondary | ICD-10-CM | POA: Diagnosis not present

## 2022-07-15 DIAGNOSIS — R32 Unspecified urinary incontinence: Secondary | ICD-10-CM | POA: Diagnosis not present

## 2022-07-15 DIAGNOSIS — M6281 Muscle weakness (generalized): Secondary | ICD-10-CM | POA: Diagnosis not present

## 2022-07-15 DIAGNOSIS — R2681 Unsteadiness on feet: Secondary | ICD-10-CM | POA: Diagnosis not present

## 2022-07-15 NOTE — Telephone Encounter (Signed)
A nurse from [facility] called back on behalf of Dr. Suzanne Boron Diarmid stating you do not need to f/u with Ms. Posner she is going to see a different doctor per the pts. Request.

## 2022-07-15 NOTE — Telephone Encounter (Signed)
Called patient per Dr. Redmond School. Spoke with her daughter and asked which oncologist they would like to see since Dr. McDiarmid called Dr. Redmond School and asked for [name] to place referral. Daughter said she was not sure at the moment. Said she had someone working on it for a possible [facility] referral. I let her know we would be glad to place the referral if she would like to call back.

## 2022-08-21 ENCOUNTER — Ambulatory Visit: Payer: Medicare PPO

## 2022-10-23 ENCOUNTER — Encounter (INDEPENDENT_AMBULATORY_CARE_PROVIDER_SITE_OTHER): Payer: Medicare PPO | Admitting: Ophthalmology

## 2022-11-14 DEATH — deceased
